# Patient Record
Sex: Female | Born: 1980
Health system: Southern US, Community
[De-identification: ages and names within clinical notes are randomized; demographics above are authoritative.]

## PROBLEM LIST (undated history)

## (undated) ENCOUNTER — Inpatient Hospital Stay (HOSPITAL_COMMUNITY): Payer: Self-pay

## (undated) DIAGNOSIS — G5600 Carpal tunnel syndrome, unspecified upper limb: Secondary | ICD-10-CM

## (undated) DIAGNOSIS — E785 Hyperlipidemia, unspecified: Secondary | ICD-10-CM

## (undated) DIAGNOSIS — N390 Urinary tract infection, site not specified: Secondary | ICD-10-CM

## (undated) DIAGNOSIS — F909 Attention-deficit hyperactivity disorder, unspecified type: Secondary | ICD-10-CM

## (undated) DIAGNOSIS — E669 Obesity, unspecified: Secondary | ICD-10-CM

## (undated) DIAGNOSIS — O139 Gestational [pregnancy-induced] hypertension without significant proteinuria, unspecified trimester: Secondary | ICD-10-CM

## (undated) DIAGNOSIS — F419 Anxiety disorder, unspecified: Secondary | ICD-10-CM

## (undated) DIAGNOSIS — F32A Depression, unspecified: Secondary | ICD-10-CM

## (undated) DIAGNOSIS — K900 Celiac disease: Secondary | ICD-10-CM

## (undated) DIAGNOSIS — O24419 Gestational diabetes mellitus in pregnancy, unspecified control: Secondary | ICD-10-CM

## (undated) DIAGNOSIS — E119 Type 2 diabetes mellitus without complications: Secondary | ICD-10-CM

## (undated) DIAGNOSIS — F329 Major depressive disorder, single episode, unspecified: Secondary | ICD-10-CM

## (undated) HISTORY — DX: Obesity, unspecified: E66.9

## (undated) HISTORY — DX: Hyperlipidemia, unspecified: E78.5

## (undated) HISTORY — DX: Anxiety disorder, unspecified: F41.9

## (undated) HISTORY — DX: Major depressive disorder, single episode, unspecified: F32.9

## (undated) HISTORY — DX: Gestational (pregnancy-induced) hypertension without significant proteinuria, unspecified trimester: O13.9

## (undated) HISTORY — DX: Depression, unspecified: F32.A

## (undated) HISTORY — DX: Attention-deficit hyperactivity disorder, unspecified type: F90.9

## (undated) HISTORY — DX: Urinary tract infection, site not specified: N39.0

## (undated) HISTORY — DX: Type 2 diabetes mellitus without complications: E11.9

## (undated) HISTORY — DX: Carpal tunnel syndrome, unspecified upper limb: G56.00

## (undated) HISTORY — DX: Gestational diabetes mellitus in pregnancy, unspecified control: O24.419

## (undated) HISTORY — PX: MOUTH SURGERY: SHX715

---

## 2010-03-25 HISTORY — PX: SKIN BIOPSY: SHX1

## 2013-01-14 LAB — OB RESULTS CONSOLE GC/CHLAMYDIA: Chlamydia: NEGATIVE

## 2013-02-22 ENCOUNTER — Encounter: Payer: Self-pay | Admitting: *Deleted

## 2013-02-23 ENCOUNTER — Encounter (INDEPENDENT_AMBULATORY_CARE_PROVIDER_SITE_OTHER): Payer: Self-pay

## 2013-02-23 ENCOUNTER — Encounter: Payer: Self-pay | Admitting: Women's Health

## 2013-02-23 ENCOUNTER — Ambulatory Visit (INDEPENDENT_AMBULATORY_CARE_PROVIDER_SITE_OTHER): Payer: Medicaid Other | Admitting: Women's Health

## 2013-02-23 VITALS — BP 132/80 | Ht 67.0 in | Wt 252.4 lb

## 2013-02-23 DIAGNOSIS — Z331 Pregnant state, incidental: Secondary | ICD-10-CM

## 2013-02-23 DIAGNOSIS — O99340 Other mental disorders complicating pregnancy, unspecified trimester: Secondary | ICD-10-CM

## 2013-02-23 DIAGNOSIS — O0991 Supervision of high risk pregnancy, unspecified, first trimester: Secondary | ICD-10-CM

## 2013-02-23 DIAGNOSIS — F418 Other specified anxiety disorders: Secondary | ICD-10-CM | POA: Insufficient documentation

## 2013-02-23 DIAGNOSIS — O99019 Anemia complicating pregnancy, unspecified trimester: Secondary | ICD-10-CM

## 2013-02-23 DIAGNOSIS — Z1389 Encounter for screening for other disorder: Secondary | ICD-10-CM

## 2013-02-23 DIAGNOSIS — O210 Mild hyperemesis gravidarum: Secondary | ICD-10-CM

## 2013-02-23 DIAGNOSIS — O09299 Supervision of pregnancy with other poor reproductive or obstetric history, unspecified trimester: Secondary | ICD-10-CM

## 2013-02-23 DIAGNOSIS — Z3480 Encounter for supervision of other normal pregnancy, unspecified trimester: Secondary | ICD-10-CM | POA: Insufficient documentation

## 2013-02-23 DIAGNOSIS — O09899 Supervision of other high risk pregnancies, unspecified trimester: Secondary | ICD-10-CM

## 2013-02-23 DIAGNOSIS — Z3481 Encounter for supervision of other normal pregnancy, first trimester: Secondary | ICD-10-CM

## 2013-02-23 LAB — POCT URINALYSIS DIPSTICK
Glucose, UA: NEGATIVE
Ketones, UA: NEGATIVE
Nitrite, UA: NEGATIVE

## 2013-02-23 LAB — CBC
MCH: 28.9 pg (ref 26.0–34.0)
MCV: 84.1 fL (ref 78.0–100.0)
Platelets: 242 10*3/uL (ref 150–400)
RBC: 4.08 MIL/uL (ref 3.87–5.11)
RDW: 14.1 % (ref 11.5–15.5)
WBC: 9.9 10*3/uL (ref 4.0–10.5)

## 2013-02-23 NOTE — Patient Instructions (Signed)
Nausea & Vomiting  Have saltine crackers or pretzels by your bed and eat a few bites before you raise your head out of bed in the morning  Eat small frequent meals throughout the day instead of large meals  Drink plenty of fluids throughout the day to stay hydrated, just don't drink a lot of fluids with your meals.  This can make your stomach fill up faster making you feel sick  Do not brush your teeth right after you eat  Products with real ginger are good for nausea, like ginger ale and ginger hard candy Make sure it says made with real ginger!  Sucking on sour candy like lemon heads is also good for nausea  If your prenatal vitamins make you nauseated, take them at night so you will sleep through the nausea  If you feel like you need medicine for the nausea & vomiting please let us know  If you are unable to keep any fluids or food down please let us know    Pregnancy - First Trimester During sexual intercourse, millions of sperm go into the vagina. Only 1 sperm will penetrate and fertilize the female egg while it is in the Fallopian tube. One week later, the fertilized egg implants into the wall of the uterus. An embryo begins to develop into a baby. At 6 to 8 weeks, the eyes and face are formed and the heartbeat can be seen on ultrasound. At the end of 12 weeks (first trimester), all the baby's organs are formed. Now that you are pregnant, you will want to do everything you can to have a healthy baby. Two of the most important things are to get good prenatal care and follow your caregiver's instructions. Prenatal care is all the medical care you receive before the baby's birth. It is given to prevent, find, and treat problems during the pregnancy and childbirth. PRENATAL EXAMS  During prenatal visits, your weight, blood pressure, and urine are checked. This is done to make sure you are healthy and progressing normally during the pregnancy.  A pregnant woman should gain 25 to 35 pounds  during the pregnancy. However, if you are overweight or underweight, your caregiver will advise you regarding your weight.  Your caregiver will ask and answer questions for you.  Blood work, cervical cultures, other necessary tests, and a Pap test are done during your prenatal exams. These tests are done to check on your health and the probable health of your baby. Tests are strongly recommended and done for HIV with your permission. This is the virus that causes AIDS. These tests are done because medicines can be given to help prevent your baby from being born with this infection should you have been infected without knowing it. Blood work is also used to find out your blood type, previous infections, and follow your blood levels (hemoglobin).  Low hemoglobin (anemia) is common during pregnancy. Iron and vitamins are given to help prevent this. Later in the pregnancy, blood tests for diabetes will be done along with any other tests if any problems develop.  You may need other tests to make sure you and the baby are doing well. CHANGES DURING THE FIRST TRIMESTER  Your body goes through many changes during pregnancy. They vary from person to person. Talk to your caregiver about changes you notice and are concerned about. Changes can include:  Your menstrual period stops.  The egg and sperm carry the genes that determine what you look like. Genes from you   and your partner are forming a baby. The female genes determine whether the baby is a boy or a girl.  Your body increases in girth and you may feel bloated.  Feeling sick to your stomach (nauseous) and throwing up (vomiting). If the vomiting is uncontrollable, call your caregiver.  Your breasts will begin to enlarge and become tender.  Your nipples may stick out more and become darker.  The need to urinate more. Painful urination may mean you have a bladder infection.  Tiring easily.  Loss of appetite.  Cravings for certain kinds of  food.  At first, you may gain or lose a couple of pounds.  You may have changes in your emotions from day to day (excited to be pregnant or concerned something may go wrong with the pregnancy and baby).  You may have more vivid and strange dreams. HOME CARE INSTRUCTIONS   It is very important to avoid all smoking, alcohol and non-prescribed drugs during your pregnancy. These affect the formation and growth of the baby. Avoid chemicals while pregnant to ensure the delivery of a healthy infant.  Start your prenatal visits by the 12th week of pregnancy. They are usually scheduled monthly at first, then more often in the last 2 months before delivery. Keep your caregiver's appointments. Follow your caregiver's instructions regarding medicine use, blood and lab tests, exercise, and diet.  During pregnancy, you are providing food for you and your baby. Eat regular, well-balanced meals. Choose foods such as meat, fish, milk and other low fat dairy products, vegetables, fruits, and whole-grain breads and cereals. Your caregiver will tell you of the ideal weight gain.  You can help morning sickness by keeping soda crackers at the bedside. Eat a couple before arising in the morning. You may want to use the crackers without salt on them.  Eating 4 to 5 small meals rather than 3 large meals a day also may help the nausea and vomiting.  Drinking liquids between meals instead of during meals also seems to help nausea and vomiting.  A physical sexual relationship may be continued throughout pregnancy if there are no other problems. Problems may be early (premature) leaking of amniotic fluid from the membranes, vaginal bleeding, or belly (abdominal) pain.  Exercise regularly if there are no restrictions. Check with your caregiver or physical therapist if you are unsure of the safety of some of your exercises. Greater weight gain will occur in the last 2 trimesters of pregnancy. Exercising will  help:  Control your weight.  Keep you in shape.  Prepare you for labor and delivery.  Help you lose your pregnancy weight after you deliver your baby.  Wear a good support or jogging bra for breast tenderness during pregnancy. This may help if worn during sleep too.  Ask when prenatal classes are available. Begin classes when they are offered.  Do not use hot tubs, steam rooms, or saunas.  Wear your seat belt when driving. This protects you and your baby if you are in an accident.  Avoid raw meat, uncooked cheese, cat litter boxes, and soil used by cats throughout the pregnancy. These carry germs that can cause birth defects in the baby.  The first trimester is a good time to visit your dentist for your dental health. Getting your teeth cleaned is okay. Use a softer toothbrush and brush gently during pregnancy.  Ask for help if you have financial, counseling, or nutritional needs during pregnancy. Your caregiver will be able to offer counseling for   these needs as well as refer you for other special needs.  Do not take any medicines or herbs unless told by your caregiver.  Inform your caregiver if there is any mental or physical domestic violence.  Make a list of emergency phone numbers of family, friends, hospital, and police and fire departments.  Write down your questions. Take them to your prenatal visit.  Do not douche.  Do not cross your legs.  If you have to stand for long periods of time, rotate you feet or take small steps in a circle.  You may have more vaginal secretions that may require a sanitary pad. Do not use tampons or scented sanitary pads. MEDICINES AND DRUG USE IN PREGNANCY  Take prenatal vitamins as directed. The vitamin should contain 1 milligram of folic acid. Keep all vitamins out of reach of children. Only a couple vitamins or tablets containing iron may be fatal to a baby or young child when ingested.  Avoid use of all medicines, including herbs,  over-the-counter medicines, not prescribed or suggested by your caregiver. Only take over-the-counter or prescription medicines for pain, discomfort, or fever as directed by your caregiver. Do not use aspirin, ibuprofen, or naproxen unless directed by your caregiver.  Let your caregiver also know about herbs you may be using.  Alcohol is related to a number of birth defects. This includes fetal alcohol syndrome. All alcohol, in any form, should be avoided completely. Smoking will cause low birth rate and premature babies.  Street or illegal drugs are very harmful to the baby. They are absolutely forbidden. A baby born to an addicted mother will be addicted at birth. The baby will go through the same withdrawal an adult does.  Let your caregiver know about any medicines that you have to take and for what reason you take them. SEEK MEDICAL CARE IF:  You have any concerns or worries during your pregnancy. It is better to call with your questions if you feel they cannot wait, rather than worry about them. SEEK IMMEDIATE MEDICAL CARE IF:   An unexplained oral temperature above 102 F (38.9 C) develops, or as your caregiver suggests.  You have leaking of fluid from the vagina (birth canal). If leaking membranes are suspected, take your temperature and inform your caregiver of this when you call.  There is vaginal spotting or bleeding. Notify your caregiver of the amount and how many pads are used.  You develop a bad smelling vaginal discharge with a change in the color.  You continue to feel sick to your stomach (nauseated) and have no relief from remedies suggested. You vomit blood or coffee ground-like materials.  You lose more than 2 pounds of weight in 1 week.  You gain more than 2 pounds of weight in 1 week and you notice swelling of your face, hands, feet, or legs.  You gain 5 pounds or more in 1 week (even if you do not have swelling of your hands, face, legs, or feet).  You get  exposed to German measles and have never had them.  You are exposed to fifth disease or chickenpox.  You develop belly (abdominal) pain. Round ligament discomfort is a common non-cancerous (benign) cause of abdominal pain in pregnancy. Your caregiver still must evaluate this.  You develop headache, fever, diarrhea, pain with urination, or shortness of breath.  You fall or are in a car accident or have any kind of trauma.  There is mental or physical violence in your home. Document   Released: 03/05/2001 Document Revised: 12/04/2011 Document Reviewed: 09/06/2008 [employer] Patient Information 2014 [employer].  

## 2013-02-23 NOTE — Progress Notes (Addendum)
Subjective:    Julia Johnson is a 32 y.o. G11P1011 Caucasian female at [redacted]w[redacted]d by 7.3wk u/s, which differed from certain LMP by 8d. She is a transfer from [facility] in [place], [place] where she began care at 5wks. She states she had an early u/s w/ assigned EDB of 09/21/12 (8d difference from LMP), so they were going by u/s, but they did not send that u/s w/ her records. We will call to get it faxed.  Her BP at initial visit there at 5wks was 148/94. Denies h/o HTN. Had neg pap w/ -HRHPV in Oct. She is being seen today for her first obstetrical visit w/ [place].  She used to live in [place], and she and her family moved back here 11/15 after her husband got a job here, so that she can be closer to her family for help during pregnancy. Her husband, Lorin Picket, was adopted at birth. He has CP and is in a wheelchair d/t lack of O2 at birth. Does not know anything else about his family medical hx d/t adoption. Her obstetrical history is significant for term uncomplicated SVD in 2008- states her BPs had begun getting higher towards end of pregnancy but did not have pre-e. She had a SAB in Feb of this year..  Daughter has celiac disease. Pregnancy history fully reviewed. She does have a h/o depression/anxiety- was on prozac and adderall prior to pregnancy, quit both w/ +PT, states she is doing well w/o meds. Was seeing NP at [facility]. Would like referral for therapy here.    Patient reports Spotting earlier in her pregnancy which has resolved. Denies cramping/vb, uti s/s. She has had some n/v- which seems to be getting some better- declines need for antiemetics. Nonproductive cough, no other uri sx.   Filed Vitals:   02/23/13 1434  BP: 132/80  Weight: 252 lb 6.4 oz (114.488 kg)    HISTORY: OB History  Gravida Para Term Preterm AB SAB TAB Ectopic Multiple Living  3 1 1  1 1    1     # Outcome Date GA Lbr Len/2nd Weight Sex Delivery Anes PTL Lv  3 CUR           2 SAB 05/10/12          1 TRM  05/09/06 [redacted]w[redacted]d  8 lb 7 oz (3.827 kg) F SVD None  Y     Comments: BPs had started to elevate, but no pre-e     Past Medical History  Diagnosis Date  . Carpal tunnel syndrome   . Depression   . ADHD (attention deficit hyperactivity disorder)   . Hyperlipidemia   . Obesity   . Anxiety   . UTI (urinary tract infection)    Past Surgical History  Procedure Laterality Date  . Mouth surgery     Family History  Problem Relation Age of Onset  . Cancer Mother     breast  . Diabetes Mother   . Factor V Leiden deficiency Mother   . Hypertension Father   . Diabetes Father   . Aneurysm Father   . Diabetes Maternal Grandmother   . Celiac disease Daughter   . Diabetes Paternal Grandfather      Exam   System:     Skin: normal coloration and turgor, no rashes    Neurologic: oriented, normal mood   Extremities: normal strength, tone, and muscle mass   HEENT PERRLA   Mouth/Teeth mucous membranes moist   Cardiovascular: regular rate and rhythm  Respiratory:  appears well, vitals normal, no respiratory distress, acyanotic, normal RR   Abdomen: soft, non-tender    FHR 162 via doppler   Assessment:    Pregnancy: G3P1011 Patient Active Problem List   Diagnosis Date Noted  . Supervision of other normal pregnancy 02/23/2013    Priority: High      [redacted]w[redacted]d G3P1011 New OB visit N/V pregnancy H/O depression/anxiety Elevated bp at 5wks    Plan:     Initial labs drawn Continue prenatal vitamins Problem list reviewed and updated Reviewed n/v relief measures and warning s/s to report Reviewed recommended weight gain based on pre-gravid BMI Encouraged well-balanced diet Genetic Screening discussed Integrated Screen: declined Cystic fibrosis screening discussed declined Ultrasound discussed; fetal survey: requested Follow up in 4 weeks for visit Monitor BPs Will get u/s report from previous provider   Marge Duncans 02/23/2013 3:30 PM

## 2013-02-24 LAB — ANTIBODY SCREEN: Antibody Screen: NEGATIVE

## 2013-02-24 LAB — ABO AND RH: Rh Type: POSITIVE

## 2013-02-24 LAB — RUBELLA SCREEN: Rubella: 1.62 Index — ABNORMAL HIGH (ref <0.90)

## 2013-02-24 LAB — CYSTIC FIBROSIS DIAGNOSTIC STUDY

## 2013-02-24 LAB — URINE CULTURE: Colony Count: NO GROWTH

## 2013-02-24 LAB — RPR

## 2013-02-24 LAB — VARICELLA ZOSTER ANTIBODY, IGG: Varicella IgG: 273.6 Index — ABNORMAL HIGH (ref <135.00)

## 2013-02-24 LAB — HIV ANTIBODY (ROUTINE TESTING W REFLEX): HIV: NONREACTIVE

## 2013-02-26 ENCOUNTER — Encounter: Payer: Self-pay | Admitting: Women's Health

## 2013-03-01 ENCOUNTER — Encounter: Payer: Self-pay | Admitting: Women's Health

## 2013-03-23 ENCOUNTER — Ambulatory Visit (INDEPENDENT_AMBULATORY_CARE_PROVIDER_SITE_OTHER): Payer: Medicaid Other | Admitting: Advanced Practice Midwife

## 2013-03-23 ENCOUNTER — Encounter: Payer: Self-pay | Admitting: Advanced Practice Midwife

## 2013-03-23 VITALS — BP 118/80 | Wt 249.0 lb

## 2013-03-23 DIAGNOSIS — Z3482 Encounter for supervision of other normal pregnancy, second trimester: Secondary | ICD-10-CM

## 2013-03-23 DIAGNOSIS — O09899 Supervision of other high risk pregnancies, unspecified trimester: Secondary | ICD-10-CM

## 2013-03-23 DIAGNOSIS — O09299 Supervision of pregnancy with other poor reproductive or obstetric history, unspecified trimester: Secondary | ICD-10-CM

## 2013-03-23 DIAGNOSIS — Z331 Pregnant state, incidental: Secondary | ICD-10-CM

## 2013-03-23 DIAGNOSIS — O99019 Anemia complicating pregnancy, unspecified trimester: Secondary | ICD-10-CM

## 2013-03-23 DIAGNOSIS — O99340 Other mental disorders complicating pregnancy, unspecified trimester: Secondary | ICD-10-CM

## 2013-03-23 DIAGNOSIS — Z1389 Encounter for screening for other disorder: Secondary | ICD-10-CM

## 2013-03-23 LAB — POCT URINALYSIS DIPSTICK
Blood, UA: NEGATIVE
Glucose, UA: NEGATIVE
Ketones, UA: NEGATIVE
Protein, UA: NEGATIVE

## 2013-03-23 MED ORDER — PNV PRENATAL PLUS MULTIVITAMIN 27-1 MG PO TABS: 1.0000 | ORAL_TABLET | Freq: Once | ORAL | Status: DC

## 2013-03-23 NOTE — Progress Notes (Signed)
No real problems/complaints. Feels OK as far as depression.  Husband worried, but pt doesn't want to take meds.   Wants to start exercising: pool or bike or eliptical.  Start slowly. Routine questions about pregnancy answered.  F/U in 5 weeks for LROB.

## 2013-03-25 NOTE — L&D Delivery Note (Signed)
Delivery Note At 11:35 PM a viable female was delivered via Vaginal, Spontaneous Delivery (Presentation: Right Occiput Anterior).  APGAR: 8, 9; weight .   Placenta status: Intact, Spontaneous.  Cord:  with the following complications: nuchal x1.    Anesthesia: None  Episiotomy: n/a Lacerations: 2nd degree Suture Repair: 3.0 vicryl Est. Blood Loss (mL): 450cc  Mom to postpartum.  Baby to Couplet care / Skin to Skin.  Pt pushed with good maternal effort to deliver a liveborn female via NSVD with spontaneous cry.   Baby placed on maternal abdomen.  Delayed cord clamping performed.  Cord cut by FOB.  Placenta delivered intact with 3V cord via traction and pitocin.  2nd degree tear repaired in usual fashion. Small posterior labial hematoma approx 1cm in diameter had formed prior to repair but remained stable in size. Will cont to monitor.  Mom and baby to postpartum.   BECK, KELI L 09/15/2013, 12:07 AM

## 2013-04-26 ENCOUNTER — Encounter: Payer: Self-pay | Admitting: General Practice

## 2013-04-27 ENCOUNTER — Ambulatory Visit (INDEPENDENT_AMBULATORY_CARE_PROVIDER_SITE_OTHER): Payer: Medicaid Other | Admitting: Women's Health

## 2013-04-27 ENCOUNTER — Encounter: Payer: Self-pay | Admitting: Women's Health

## 2013-04-27 ENCOUNTER — Other Ambulatory Visit: Payer: Self-pay | Admitting: Advanced Practice Midwife

## 2013-04-27 ENCOUNTER — Ambulatory Visit (INDEPENDENT_AMBULATORY_CARE_PROVIDER_SITE_OTHER): Payer: Medicaid Other

## 2013-04-27 VITALS — BP 120/68 | Wt 254.5 lb

## 2013-04-27 DIAGNOSIS — Z3480 Encounter for supervision of other normal pregnancy, unspecified trimester: Secondary | ICD-10-CM

## 2013-04-27 DIAGNOSIS — O09299 Supervision of pregnancy with other poor reproductive or obstetric history, unspecified trimester: Secondary | ICD-10-CM

## 2013-04-27 DIAGNOSIS — O99340 Other mental disorders complicating pregnancy, unspecified trimester: Secondary | ICD-10-CM

## 2013-04-27 DIAGNOSIS — O09899 Supervision of other high risk pregnancies, unspecified trimester: Secondary | ICD-10-CM

## 2013-04-27 DIAGNOSIS — Z1389 Encounter for screening for other disorder: Secondary | ICD-10-CM

## 2013-04-27 DIAGNOSIS — Z23 Encounter for immunization: Secondary | ICD-10-CM

## 2013-04-27 DIAGNOSIS — Z3482 Encounter for supervision of other normal pregnancy, second trimester: Secondary | ICD-10-CM

## 2013-04-27 DIAGNOSIS — Z331 Pregnant state, incidental: Secondary | ICD-10-CM

## 2013-04-27 DIAGNOSIS — O99019 Anemia complicating pregnancy, unspecified trimester: Secondary | ICD-10-CM

## 2013-04-27 DIAGNOSIS — Z803 Family history of malignant neoplasm of breast: Secondary | ICD-10-CM

## 2013-04-27 LAB — POCT URINALYSIS DIPSTICK
Blood, UA: NEGATIVE
GLUCOSE UA: NEGATIVE
Ketones, UA: NEGATIVE
LEUKOCYTES UA: NEGATIVE
Nitrite, UA: NEGATIVE
Protein, UA: NEGATIVE

## 2013-04-27 MED ORDER — INFLUENZA VAC SPLIT QUAD 0.5 ML IM SUSP
0.5000 mL | Freq: Once | INTRAMUSCULAR | Status: AC
  Administered 2013-04-27: 0.5 mL via INTRAMUSCULAR

## 2013-04-27 NOTE — Patient Instructions (Signed)
You will have your sugar test next visit.  Please do not eat or drink anything after midnight the night before you come, not even water.  You will be here for at least two hours.     Second Trimester of Pregnancy The second trimester is from week 13 through week 28, months 4 through 6. The second trimester is often a time when you feel your best. Your body has also adjusted to being pregnant, and you begin to feel better physically. Usually, morning sickness has lessened or quit completely, you may have more energy, and you may have an increase in appetite. The second trimester is also a time when the fetus is growing rapidly. At the end of the sixth month, the fetus is about 9 inches long and weighs about 1 pounds. You will likely begin to feel the baby move (quickening) between 18 and 20 weeks of the pregnancy. BODY CHANGES Your body goes through many changes during pregnancy. The changes vary from woman to woman.   Your weight will continue to increase. You will notice your lower abdomen bulging out.  You may begin to get stretch marks on your hips, abdomen, and breasts.  You may develop headaches that can be relieved by medicines approved by your caregiver.  You may urinate more often because the fetus is pressing on your bladder.  You may develop or continue to have heartburn as a result of your pregnancy.  You may develop constipation because certain hormones are causing the muscles that push waste through your intestines to slow down.  You may develop hemorrhoids or swollen, bulging veins (varicose veins).  You may have back pain because of the weight gain and pregnancy hormones relaxing your joints between the bones in your pelvis and as a result of a shift in weight and the muscles that support your balance.  Your breasts will continue to grow and be tender.  Your gums may bleed and may be sensitive to brushing and flossing.  Dark spots or blotches (chloasma, mask of pregnancy)  may develop on your face. This will likely fade after the baby is born.  A dark line from your belly button to the pubic area (linea nigra) may appear. This will likely fade after the baby is born. WHAT TO EXPECT AT YOUR PRENATAL VISITS During a routine prenatal visit:  You will be weighed to make sure you and the fetus are growing normally.  Your blood pressure will be taken.  Your abdomen will be measured to track your baby's growth.  The fetal heartbeat will be listened to.  Any test results from the previous visit will be discussed. Your caregiver may ask you:  How you are feeling.  If you are feeling the baby move.  If you have had any abnormal symptoms, such as leaking fluid, bleeding, severe headaches, or abdominal cramping.  If you have any questions. Other tests that may be performed during your second trimester include:  Blood tests that check for:  Low iron levels (anemia).  Gestational diabetes (between 24 and 28 weeks).  Rh antibodies.  Urine tests to check for infections, diabetes, or protein in the urine.  An ultrasound to confirm the proper growth and development of the baby.  An amniocentesis to check for possible genetic problems.  Fetal screens for spina bifida and Down syndrome. HOME CARE INSTRUCTIONS   Avoid all smoking, herbs, alcohol, and unprescribed drugs. These chemicals affect the formation and growth of the baby.  Follow your caregiver's   instructions regarding medicine use. There are medicines that are either safe or unsafe to take during pregnancy.  Exercise only as directed by your caregiver. Experiencing uterine cramps is a good sign to stop exercising.  Continue to eat regular, healthy meals.  Wear a good support bra for breast tenderness.  Do not use hot tubs, steam rooms, or saunas.  Wear your seat belt at all times when driving.  Avoid raw meat, uncooked cheese, cat litter boxes, and soil used by cats. These carry germs that  can cause birth defects in the baby.  Take your prenatal vitamins.  Try taking a stool softener (if your caregiver approves) if you develop constipation. Eat more high-fiber foods, such as fresh vegetables or fruit and whole grains. Drink plenty of fluids to keep your urine clear or pale yellow.  Take warm sitz baths to soothe any pain or discomfort caused by hemorrhoids. Use hemorrhoid cream if your caregiver approves.  If you develop varicose veins, wear support hose. Elevate your feet for 15 minutes, 3 4 times a day. Limit salt in your diet.  Avoid heavy lifting, wear low heel shoes, and practice good posture.  Rest with your legs elevated if you have leg cramps or low back pain.  Visit your dentist if you have not gone yet during your pregnancy. Use a soft toothbrush to brush your teeth and be gentle when you floss.  A sexual relationship may be continued unless your caregiver directs you otherwise.  Continue to go to all your prenatal visits as directed by your caregiver. SEEK MEDICAL CARE IF:   You have dizziness.  You have mild pelvic cramps, pelvic pressure, or nagging pain in the abdominal area.  You have persistent nausea, vomiting, or diarrhea.  You have a bad smelling vaginal discharge.  You have pain with urination. SEEK IMMEDIATE MEDICAL CARE IF:   You have a fever.  You are leaking fluid from your vagina.  You have spotting or bleeding from your vagina.  You have severe abdominal cramping or pain.  You have rapid weight gain or loss.  You have shortness of breath with chest pain.  You notice sudden or extreme swelling of your face, hands, ankles, feet, or legs.  You have not felt your baby move in over an hour.  You have severe headaches that do not go away with medicine.  You have vision changes. Document Released: 03/05/2001 Document Revised: 11/11/2012 Document Reviewed: 05/12/2012 [employer] Patient Information 2014 [employer].  

## 2013-04-27 NOTE — Progress Notes (Signed)
U/S(19+0wks)-active fetus, meas c/w dates, fluid wnl, anterior Gr 0 placenta, cx appears long and closed (3.7cm), bilateral adnexa wnl, no obvious abnl noted, FHR- 153bpm

## 2013-04-27 NOTE — Progress Notes (Signed)
Reports good fm. Denies uc's, lof, vb, uti s/s.  No complaints.  Reviewed today's anatomy u/s, ptl s/s.  Mom dx w/ breast CA at 33yo, MD recommended Julia Johnson to begin getting mammo 54yr earlier than mom's dx, she plans to BF x 12mths, so will get mammo pp when finished BF. Reports strong family h/o DM in immediate family, coupled w/ BMI of 38.4, will do early 2hr gtt. All questions answered. F/U asap for early 2hr gtt, then 3wks for visit.

## 2013-04-30 ENCOUNTER — Other Ambulatory Visit: Payer: Medicaid Other

## 2013-04-30 DIAGNOSIS — Z3480 Encounter for supervision of other normal pregnancy, unspecified trimester: Secondary | ICD-10-CM

## 2013-04-30 LAB — CBC
HCT: 31.5 % — ABNORMAL LOW (ref 36.0–46.0)
HEMOGLOBIN: 10.6 g/dL — AB (ref 12.0–15.0)
MCH: 29 pg (ref 26.0–34.0)
MCHC: 33.7 g/dL (ref 30.0–36.0)
MCV: 86.1 fL (ref 78.0–100.0)
Platelets: 199 10*3/uL (ref 150–400)
RBC: 3.66 MIL/uL — AB (ref 3.87–5.11)
RDW: 14.5 % (ref 11.5–15.5)
WBC: 9.3 10*3/uL (ref 4.0–10.5)

## 2013-04-30 LAB — RPR

## 2013-05-01 LAB — GLUCOSE TOLERANCE, 2 HOURS W/ 1HR
GLUCOSE: 147 mg/dL (ref 70–170)
Glucose, 2 hour: 121 mg/dL (ref 70–139)
Glucose, Fasting: 92 mg/dL (ref 70–99)

## 2013-05-01 LAB — ANTIBODY SCREEN: ANTIBODY SCREEN: NEGATIVE

## 2013-05-01 LAB — HIV ANTIBODY (ROUTINE TESTING W REFLEX): HIV: NONREACTIVE

## 2013-05-02 ENCOUNTER — Encounter: Payer: Self-pay | Admitting: Obstetrics & Gynecology

## 2013-05-03 LAB — HSV 2 ANTIBODY, IGG: HSV 2 Glycoprotein G Ab, IgG: <0.1 IV

## 2013-05-05 ENCOUNTER — Ambulatory Visit (INDEPENDENT_AMBULATORY_CARE_PROVIDER_SITE_OTHER): Payer: Medicaid Other | Admitting: Women's Health

## 2013-05-05 VITALS — BP 120/78 | Wt 253.0 lb

## 2013-05-05 DIAGNOSIS — Z3480 Encounter for supervision of other normal pregnancy, unspecified trimester: Secondary | ICD-10-CM

## 2013-05-05 DIAGNOSIS — O09299 Supervision of pregnancy with other poor reproductive or obstetric history, unspecified trimester: Secondary | ICD-10-CM

## 2013-05-05 DIAGNOSIS — O99340 Other mental disorders complicating pregnancy, unspecified trimester: Secondary | ICD-10-CM

## 2013-05-05 DIAGNOSIS — O09899 Supervision of other high risk pregnancies, unspecified trimester: Secondary | ICD-10-CM

## 2013-05-05 DIAGNOSIS — Z331 Pregnant state, incidental: Secondary | ICD-10-CM

## 2013-05-05 DIAGNOSIS — O99019 Anemia complicating pregnancy, unspecified trimester: Secondary | ICD-10-CM

## 2013-05-05 DIAGNOSIS — Z1389 Encounter for screening for other disorder: Secondary | ICD-10-CM

## 2013-05-05 LAB — POCT URINALYSIS DIPSTICK
Blood, UA: NEGATIVE
GLUCOSE UA: NEGATIVE
Ketones, UA: NEGATIVE
Nitrite, UA: NEGATIVE
Protein, UA: NEGATIVE

## 2013-05-05 NOTE — Progress Notes (Signed)
Work-in: intermittent throbbing Lt sided chest pain, feels almost like pulled muscle and 'just feeling weird'. Has been taking bp's at home and they have been 'all over the place' 110s/70s-150s/100s. No h/o HTN. Denies ha, scotomata, ruq/epigastric pain, n/v.  Does see floaters while lying in bed q am when she wakes up, but this happened pre-pregnancy as well. Denies sob, n/v, reflux/heartburn. HRRR, LCTAB, chest non-tender to palpation, no change w/ inspiration. Can try heating pad, apap as needed to see if it helps. To take bp 3x/d while resting at home, if consistently >140/90, high spikes, or any new sx to call us. F/u as scheduled

## 2013-05-18 ENCOUNTER — Encounter: Payer: Self-pay | Admitting: Women's Health

## 2013-05-18 ENCOUNTER — Encounter: Payer: Medicaid Other | Admitting: Women's Health

## 2013-05-24 ENCOUNTER — Encounter: Payer: Self-pay | Admitting: Women's Health

## 2013-05-24 ENCOUNTER — Encounter: Payer: Medicaid Other | Admitting: Women's Health

## 2013-05-24 ENCOUNTER — Ambulatory Visit (INDEPENDENT_AMBULATORY_CARE_PROVIDER_SITE_OTHER): Payer: Medicaid Other | Admitting: Women's Health

## 2013-05-24 VITALS — BP 128/62 | Wt 257.5 lb

## 2013-05-24 DIAGNOSIS — O09899 Supervision of other high risk pregnancies, unspecified trimester: Secondary | ICD-10-CM

## 2013-05-24 DIAGNOSIS — Z3480 Encounter for supervision of other normal pregnancy, unspecified trimester: Secondary | ICD-10-CM

## 2013-05-24 DIAGNOSIS — O99019 Anemia complicating pregnancy, unspecified trimester: Secondary | ICD-10-CM

## 2013-05-24 DIAGNOSIS — Z1389 Encounter for screening for other disorder: Secondary | ICD-10-CM

## 2013-05-24 DIAGNOSIS — Z331 Pregnant state, incidental: Secondary | ICD-10-CM

## 2013-05-24 DIAGNOSIS — O99340 Other mental disorders complicating pregnancy, unspecified trimester: Secondary | ICD-10-CM

## 2013-05-24 DIAGNOSIS — O09299 Supervision of pregnancy with other poor reproductive or obstetric history, unspecified trimester: Secondary | ICD-10-CM

## 2013-05-24 LAB — POCT URINALYSIS DIPSTICK
Blood, UA: NEGATIVE
GLUCOSE UA: NEGATIVE
Ketones, UA: NEGATIVE
Leukocytes, UA: NEGATIVE
NITRITE UA: NEGATIVE
Protein, UA: NEGATIVE

## 2013-05-24 NOTE — Progress Notes (Signed)
Reports good fm. Denies uc's, lof, vb, uti s/s. Occ pain to Rt of umbilicus x few days.  No evidence of hernia, no constipation.  Reviewed ptl s/s, fm.  All questions answered. F/U in 4wks for pn2 and visit.

## 2013-05-24 NOTE — Patient Instructions (Signed)
You will have your sugar test next visit.  Please do not eat or drink anything after midnight the night before you come, not even water.  You will be here for at least two hours.     [place] [facility]:  [facility] [phone]            [facility] [phone]                 [facility] [phone] (usually doesn't accept new patients unless you have family there already, you are always welcome to call and ask)             [facility] ([address] [place]) [phone]   [place] [facility]ians:   [facility]: [phone]  [facility]: [phone]    Second Trimester of Pregnancy The second trimester is from week 13 through week 28, months 4 through 6. The second trimester is often a time when you feel your best. Your body has also adjusted to being pregnant, and you begin to feel better physically. Usually, morning sickness has lessened or quit completely, you may have more energy, and you may have an increase in appetite. The second trimester is also a time when the fetus is growing rapidly. At the end of the sixth month, the fetus is about 9 inches long and weighs about 1 pounds. You will likely begin to feel the baby move (quickening) between 18 and 20 weeks of the pregnancy. BODY CHANGES Your body goes through many changes during pregnancy. The changes vary from woman to woman.   Your weight will continue to increase. You will notice your lower abdomen bulging out.  You may begin to get stretch marks on your hips, abdomen, and breasts.  You may develop headaches that can be relieved by medicines approved by your caregiver.  You may urinate more often because the fetus is pressing on your bladder.  You may develop or continue to have heartburn as a result of your pregnancy.  You may develop constipation because certain hormones are causing the muscles that push  waste through your intestines to slow down.  You may develop hemorrhoids or swollen, bulging veins (varicose veins).  You may have back pain because of the weight gain and pregnancy hormones relaxing your joints between the bones in your pelvis and as a result of a shift in weight and the muscles that support your balance.  Your breasts will continue to grow and be tender.  Your gums may bleed and may be sensitive to brushing and flossing.  Dark spots or blotches (chloasma, mask of pregnancy) may develop on your face. This will likely fade after the baby is born.  A dark line from your belly button to the pubic area (linea nigra) may appear. This will likely fade after the baby is born. WHAT TO EXPECT AT YOUR PRENATAL VISITS During a routine prenatal visit:  You will be weighed to make sure you and the fetus are growing normally.  Your blood pressure will be taken.  Your abdomen will be measured to track your baby's growth.  The fetal heartbeat will be listened to.  Any test results from the previous visit will be discussed. Your caregiver may ask you:  How you are feeling.  If you are feeling the baby move.  If you have had any abnormal symptoms, such as leaking fluid, bleeding, severe headaches, or abdominal cramping.  If you have any questions. Other tests   that may be performed during your second trimester include:  Blood tests that check for:  Low iron levels (anemia).  Gestational diabetes (between 24 and 28 weeks).  Rh antibodies.  Urine tests to check for infections, diabetes, or protein in the urine.  An ultrasound to confirm the proper growth and development of the baby.  An amniocentesis to check for possible genetic problems.  Fetal screens for spina bifida and Down syndrome. HOME CARE INSTRUCTIONS   Avoid all smoking, herbs, alcohol, and unprescribed drugs. These chemicals affect the formation and growth of the baby.  Follow your caregiver's  instructions regarding medicine use. There are medicines that are either safe or unsafe to take during pregnancy.  Exercise only as directed by your caregiver. Experiencing uterine cramps is a good sign to stop exercising.  Continue to eat regular, healthy meals.  Wear a good support bra for breast tenderness.  Do not use hot tubs, steam rooms, or saunas.  Wear your seat belt at all times when driving.  Avoid raw meat, uncooked cheese, cat litter boxes, and soil used by cats. These carry germs that can cause birth defects in the baby.  Take your prenatal vitamins.  Try taking a stool softener (if your caregiver approves) if you develop constipation. Eat more high-fiber foods, such as fresh vegetables or fruit and whole grains. Drink plenty of fluids to keep your urine clear or pale yellow.  Take warm sitz baths to soothe any pain or discomfort caused by hemorrhoids. Use hemorrhoid cream if your caregiver approves.  If you develop varicose veins, wear support hose. Elevate your feet for 15 minutes, 3 4 times a day. Limit salt in your diet.  Avoid heavy lifting, wear low heel shoes, and practice good posture.  Rest with your legs elevated if you have leg cramps or low back pain.  Visit your dentist if you have not gone yet during your pregnancy. Use a soft toothbrush to brush your teeth and be gentle when you floss.  A sexual relationship may be continued unless your caregiver directs you otherwise.  Continue to go to all your prenatal visits as directed by your caregiver. SEEK MEDICAL CARE IF:   You have dizziness.  You have mild pelvic cramps, pelvic pressure, or nagging pain in the abdominal area.  You have persistent nausea, vomiting, or diarrhea.  You have a bad smelling vaginal discharge.  You have pain with urination. SEEK IMMEDIATE MEDICAL CARE IF:   You have a fever.  You are leaking fluid from your vagina.  You have spotting or bleeding from your vagina.  You  have severe abdominal cramping or pain.  You have rapid weight gain or loss.  You have shortness of breath with chest pain.  You notice sudden or extreme swelling of your face, hands, ankles, feet, or legs.  You have not felt your baby move in over an hour.  You have severe headaches that do not go away with medicine.  You have vision changes. Document Released: 03/05/2001 Document Revised: 11/11/2012 Document Reviewed: 05/12/2012 ExitCare Patient Information 2014 [employer].  

## 2013-06-10 ENCOUNTER — Encounter: Payer: Self-pay | Admitting: Women's Health

## 2013-06-17 ENCOUNTER — Telehealth: Payer: Self-pay | Admitting: Obstetrics and Gynecology

## 2013-06-17 NOTE — Telephone Encounter (Signed)
Pt c/o sore throat, nasal drip, no fever. Pt states believes allergy related. Pt informed can take OTC Zyrtec or Claritin, if no improvement to call our office back. Pt verbalized understanding.

## 2013-06-23 ENCOUNTER — Ambulatory Visit (INDEPENDENT_AMBULATORY_CARE_PROVIDER_SITE_OTHER): Payer: BC Managed Care – PPO | Admitting: Obstetrics and Gynecology

## 2013-06-23 ENCOUNTER — Encounter: Payer: Self-pay | Admitting: Obstetrics and Gynecology

## 2013-06-23 ENCOUNTER — Other Ambulatory Visit: Payer: BC Managed Care – PPO

## 2013-06-23 VITALS — BP 110/70 | Wt 265.0 lb

## 2013-06-23 DIAGNOSIS — Z1389 Encounter for screening for other disorder: Secondary | ICD-10-CM

## 2013-06-23 DIAGNOSIS — O09299 Supervision of pregnancy with other poor reproductive or obstetric history, unspecified trimester: Secondary | ICD-10-CM

## 2013-06-23 DIAGNOSIS — Z331 Pregnant state, incidental: Secondary | ICD-10-CM

## 2013-06-23 DIAGNOSIS — O99340 Other mental disorders complicating pregnancy, unspecified trimester: Secondary | ICD-10-CM

## 2013-06-23 DIAGNOSIS — Z3480 Encounter for supervision of other normal pregnancy, unspecified trimester: Secondary | ICD-10-CM

## 2013-06-23 DIAGNOSIS — O09899 Supervision of other high risk pregnancies, unspecified trimester: Secondary | ICD-10-CM

## 2013-06-23 DIAGNOSIS — O99019 Anemia complicating pregnancy, unspecified trimester: Secondary | ICD-10-CM

## 2013-06-23 LAB — CBC
HCT: 32.4 % — ABNORMAL LOW (ref 36.0–46.0)
Hemoglobin: 10.8 g/dL — ABNORMAL LOW (ref 12.0–15.0)
MCH: 28.3 pg (ref 26.0–34.0)
MCHC: 33.3 g/dL (ref 30.0–36.0)
MCV: 84.8 fL (ref 78.0–100.0)
PLATELETS: 215 10*3/uL (ref 150–400)
RBC: 3.82 MIL/uL — AB (ref 3.87–5.11)
RDW: 14.6 % (ref 11.5–15.5)
WBC: 11.7 10*3/uL — AB (ref 4.0–10.5)

## 2013-06-23 NOTE — Progress Notes (Signed)
7775w1d. G3P1A1. No complaints. Good FM. Plans to attend childbirth refresher course next month. Birth control afterbirth discussed. Pt plans to use condoms.   This chart was scribed by Bennett Scrapehristina Taylor, Medical Scribe, for Dr. Christin BachJohn Coston Mandato on 06/23/13 at 9:43 AM. This chart was reviewed by Dr. Christin BachJohn Lucio Litsey and is accurate.

## 2013-06-23 NOTE — Patient Instructions (Addendum)
Please review the birth control options. Please also try to curb your appetite to help control weight gain.

## 2013-06-23 NOTE — Progress Notes (Signed)
Pt wanted to discuss her allergies and wt.

## 2013-06-24 LAB — POCT URINALYSIS DIPSTICK
Blood, UA: NEGATIVE
GLUCOSE UA: NEGATIVE
KETONES UA: NEGATIVE
LEUKOCYTES UA: NEGATIVE
NITRITE UA: NEGATIVE
Protein, UA: NEGATIVE

## 2013-06-24 LAB — GLUCOSE TOLERANCE, 2 HOURS W/ 1HR
GLUCOSE, FASTING: 84 mg/dL (ref 70–99)
GLUCOSE: 127 mg/dL (ref 70–170)
Glucose, 2 hour: 101 mg/dL (ref 70–139)

## 2013-06-24 LAB — HIV ANTIBODY (ROUTINE TESTING W REFLEX): HIV: NONREACTIVE

## 2013-06-24 LAB — HSV 2 ANTIBODY, IGG: HSV 2 Glycoprotein G Ab, IgG: <0.1 IV

## 2013-06-24 LAB — ANTIBODY SCREEN: ANTIBODY SCREEN: NEGATIVE

## 2013-06-24 LAB — RPR

## 2013-07-07 ENCOUNTER — Telehealth: Payer: Self-pay | Admitting: *Deleted

## 2013-07-07 DIAGNOSIS — Z3480 Encounter for supervision of other normal pregnancy, unspecified trimester: Secondary | ICD-10-CM

## 2013-07-07 NOTE — Telephone Encounter (Signed)
Pt states that she slipped and fell on her left knee and right ankle. [facility] they sent her to ED and up to L&D to be monitored and all was well. Pt states that she has had a little round ligament pain today that went away after BM this morning. Pt just wanted to make us aware. Pt states that she is ok and her next Appointment is the 29th.

## 2013-07-07 NOTE — Telephone Encounter (Signed)
Pt aware of what Selena BattenKim advised, pt verbalized understanding.

## 2013-07-19 ENCOUNTER — Inpatient Hospital Stay (HOSPITAL_COMMUNITY)
Admission: AD | Admit: 2013-07-19 | Discharge: 2013-07-19 | Disposition: A | Discharge status: Home / Self Care | Payer: Medicaid Other | Source: Ambulatory Visit | Attending: Family Medicine | Admitting: Family Medicine

## 2013-07-19 ENCOUNTER — Encounter (HOSPITAL_COMMUNITY): Payer: Self-pay | Admitting: *Deleted

## 2013-07-19 DIAGNOSIS — O26899 Other specified pregnancy related conditions, unspecified trimester: Secondary | ICD-10-CM

## 2013-07-19 DIAGNOSIS — Z3480 Encounter for supervision of other normal pregnancy, unspecified trimester: Secondary | ICD-10-CM

## 2013-07-19 DIAGNOSIS — O9989 Other specified diseases and conditions complicating pregnancy, childbirth and the puerperium: Principal | ICD-10-CM

## 2013-07-19 DIAGNOSIS — R109 Unspecified abdominal pain: Secondary | ICD-10-CM

## 2013-07-19 DIAGNOSIS — E785 Hyperlipidemia, unspecified: Secondary | ICD-10-CM | POA: Insufficient documentation

## 2013-07-19 DIAGNOSIS — F329 Major depressive disorder, single episode, unspecified: Secondary | ICD-10-CM | POA: Insufficient documentation

## 2013-07-19 DIAGNOSIS — O99891 Other specified diseases and conditions complicating pregnancy: Secondary | ICD-10-CM

## 2013-07-19 DIAGNOSIS — F3289 Other specified depressive episodes: Secondary | ICD-10-CM | POA: Insufficient documentation

## 2013-07-19 DIAGNOSIS — F909 Attention-deficit hyperactivity disorder, unspecified type: Secondary | ICD-10-CM | POA: Insufficient documentation

## 2013-07-19 DIAGNOSIS — F411 Generalized anxiety disorder: Secondary | ICD-10-CM | POA: Insufficient documentation

## 2013-07-19 LAB — URINALYSIS, ROUTINE W REFLEX MICROSCOPIC
Bilirubin Urine: NEGATIVE
Glucose, UA: NEGATIVE mg/dL
Hgb urine dipstick: NEGATIVE
KETONES UR: NEGATIVE mg/dL
NITRITE: NEGATIVE
PH: 6 (ref 5.0–8.0)
Protein, ur: NEGATIVE mg/dL
Specific Gravity, Urine: 1.015 (ref 1.005–1.030)
Urobilinogen, UA: 0.2 mg/dL (ref 0.0–1.0)

## 2013-07-19 LAB — URINE MICROSCOPIC-ADD ON

## 2013-07-19 NOTE — Discharge Instructions (Signed)
Abdominal Pain During Pregnancy °Abdominal pain is common in pregnancy. Most of the time, it does not cause harm. There are many causes of abdominal pain. Some causes are more serious than others. Some of the causes of abdominal pain in pregnancy are easily diagnosed. Occasionally, the diagnosis takes time to understand. Other times, the cause is not determined. Abdominal pain can be a sign that something is very wrong with the pregnancy, or the pain may have nothing to do with the pregnancy at all. For this reason, always tell your health care provider if you have any abdominal discomfort. °HOME CARE INSTRUCTIONS  °Monitor your abdominal pain for any changes. The following actions may help to alleviate any discomfort you are experiencing: °· Do not have sexual intercourse or put anything in your vagina until your symptoms go away completely. °· Get plenty of rest until your pain improves. °· Drink clear fluids if you feel nauseous. Avoid solid food as long as you are uncomfortable or nauseous. °· Only take over-the-counter or prescription medicine as directed by your health care provider. °· Keep all follow-up appointments with your health care provider. °SEEK IMMEDIATE MEDICAL CARE IF: °· You are bleeding, leaking fluid, or passing tissue from the vagina. °· You have increasing pain or cramping. °· You have persistent vomiting. °· You have painful or bloody urination. °· You have a fever. °· You notice a decrease in your baby's movements. °· You have extreme weakness or feel faint. °· You have shortness of breath, with or without abdominal pain. °· You develop a severe headache with abdominal pain. °· You have abnormal vaginal discharge with abdominal pain. °· You have persistent diarrhea. °· You have abdominal pain that continues even after rest, or gets worse. °MAKE SURE YOU:  °· Understand these instructions. °· Will watch your condition. °· Will get help right away if you are not doing well or get  worse. °Document Released: 03/11/2005 Document Revised: 12/30/2012 Document Reviewed: 10/08/2012 °ExitCare® Patient Information ©2014 [employer]. ° °

## 2013-07-19 NOTE — MAU Provider Note (Signed)
History     CSN: [id number]  Arrival date and time: 07/19/13 2004   First Provider Initiated Contact with Patient 07/19/13 2107      Chief Complaint  Patient presents with  . Abdominal Pain   Abdominal Pain    Julia Johnson is a 33 y.o. G3P1011 at 9375w6d who presents today with pain and burning around her bellybutton. She states that she has had the pain off and on for several weeks. On Saturday she had the pain almost all day, and then it went away. She has had it for several hours today, and just wanted to make sure everything was ok. She states that she fell [facility] about [facility] weeks. She was monitored on labor and delivery there for 4 hours after her fall. She did not hit her abdomen at that time. She denies any VB, LOF or contractions and confirms fetal movement.   Past Medical History  Diagnosis Date  . Carpal tunnel syndrome   . Depression   . ADHD (attention deficit hyperactivity disorder)   . Hyperlipidemia   . Obesity   . Anxiety   . UTI (urinary tract infection)     Past Surgical History  Procedure Laterality Date  . Mouth surgery      Family History  Problem Relation Age of Onset  . Cancer Mother     breast  . Diabetes Mother   . Factor V Leiden deficiency Mother   . Hypertension Father   . Diabetes Father   . Aneurysm Father   . Diabetes Maternal Grandmother   . Celiac disease Daughter   . Diabetes Paternal Grandfather     History  Substance Use Topics  . Smoking status: Never Smoker   . Smokeless tobacco: Never Used  . Alcohol Use: No    Allergies: No Known Allergies  Prescriptions prior to admission  Medication Sig Dispense Refill  . Prenatal Vit-Fe Fumarate-FA (PNV PRENATAL PLUS MULTIVITAMIN) 27-1 MG TABS Take 1 tablet by mouth once.  30 tablet  11    Review of Systems  Gastrointestinal: Positive for abdominal pain.   Physical Exam   Blood pressure 143/84, pulse 105, temperature 98.5 F (36.9 C), temperature source Oral,  resp. rate 22, height 5\' 7"  (1.702 m), weight 123.378 kg (272 lb), last menstrual period 12/07/2012, SpO2 100.00%.  Physical Exam  Nursing note and vitals reviewed. Constitutional: She is oriented to person, place, and time. She appears well-developed and well-nourished. No distress.  Cardiovascular: Normal rate.   Respiratory: Effort normal.  GI: Soft. There is no tenderness. There is no rebound and no guarding.  Genitourinary:   Closed/thick/high   Neurological: She is alert and oriented to person, place, and time.  Skin: Skin is warm and dry.  Psychiatric: She has a normal mood and affect.   FHT: `40, moderate with 15x 15 accels, no decels Toco, no UCs  MAU Course  Procedures  Results for orders placed during the hospital encounter of 07/19/13 (from the past 24 hour(s))  URINALYSIS, ROUTINE W REFLEX MICROSCOPIC     Status: Abnormal   Collection Time    07/19/13  8:05 PM      Result Value Ref Range   Color, Urine YELLOW  YELLOW   APPearance HAZY (*) CLEAR   Specific Gravity, Urine 1.015  1.005 - 1.030   pH 6.0  5.0 - 8.0   Glucose, UA NEGATIVE  NEGATIVE mg/dL   Hgb urine dipstick NEGATIVE  NEGATIVE   Bilirubin Urine  NEGATIVE  NEGATIVE   Ketones, ur NEGATIVE  NEGATIVE mg/dL   Protein, ur NEGATIVE  NEGATIVE mg/dL   Urobilinogen, UA 0.2  0.0 - 1.0 mg/dL   Nitrite NEGATIVE  NEGATIVE   Leukocytes, UA MODERATE (*) NEGATIVE  URINE MICROSCOPIC-ADD ON     Status: Abnormal   Collection Time    07/19/13  8:05 PM      Result Value Ref Range   Squamous Epithelial / LPF MANY (*) RARE   WBC, UA 7-10  <3 WBC/hpf   RBC / HPF 0-2  <3 RBC/hpf   Bacteria, UA RARE  RARE     Assessment and Plan   1. Supervision of other normal pregnancy   2. Abdominal pain complicating pregnancy    Third trimester precautions reviewed PTL precautions Fetal kick counts Return to MAU as needed  Follow-up Information   Follow up with [facility]. (As scheduled)    Specialty:  Obstetrics  and Gynecology   Contact information:   [address] [place] [id number] [phone]       Tawnya CrookHeather Donovan Elonda Giuliano 07/19/2013, 9:20 PM

## 2013-07-19 NOTE — MAU Note (Signed)
Pt reports sharp burning pain around umbilicus. States it has been happening off/on but has worsened for the last 3 hours.

## 2013-07-20 NOTE — MAU Provider Note (Signed)
Attestation of Attending Supervision of Advanced Practitioner (PA/CNM/NP): Evaluation and management procedures were performed by the Advanced Practitioner under my supervision and collaboration.  I have reviewed the Advanced Practitioner's note and chart, and I agree with the management and plan.  Reva Boresanya S Pratt, MD [facility] Attending 07/20/2013 2:38 AM

## 2013-07-21 ENCOUNTER — Encounter: Payer: Self-pay | Admitting: Women's Health

## 2013-07-21 ENCOUNTER — Ambulatory Visit (INDEPENDENT_AMBULATORY_CARE_PROVIDER_SITE_OTHER): Payer: BC Managed Care – PPO | Admitting: Women's Health

## 2013-07-21 ENCOUNTER — Encounter: Payer: BC Managed Care – PPO | Admitting: Women's Health

## 2013-07-21 VITALS — BP 132/68 | Wt 271.5 lb

## 2013-07-21 DIAGNOSIS — O09899 Supervision of other high risk pregnancies, unspecified trimester: Secondary | ICD-10-CM

## 2013-07-21 DIAGNOSIS — O09299 Supervision of pregnancy with other poor reproductive or obstetric history, unspecified trimester: Secondary | ICD-10-CM

## 2013-07-21 DIAGNOSIS — Z331 Pregnant state, incidental: Secondary | ICD-10-CM

## 2013-07-21 DIAGNOSIS — O99019 Anemia complicating pregnancy, unspecified trimester: Secondary | ICD-10-CM

## 2013-07-21 DIAGNOSIS — Z3480 Encounter for supervision of other normal pregnancy, unspecified trimester: Secondary | ICD-10-CM

## 2013-07-21 DIAGNOSIS — Z1389 Encounter for screening for other disorder: Secondary | ICD-10-CM

## 2013-07-21 DIAGNOSIS — O99340 Other mental disorders complicating pregnancy, unspecified trimester: Secondary | ICD-10-CM

## 2013-07-21 LAB — POCT URINALYSIS DIPSTICK
GLUCOSE UA: NEGATIVE
Ketones, UA: NEGATIVE
Leukocytes, UA: NEGATIVE
Nitrite, UA: NEGATIVE
Protein, UA: NEGATIVE

## 2013-07-21 NOTE — Progress Notes (Signed)
Reports good fm. Denies uc's, lof, vb, uti s/s.  [facility], more on Lt side- burning, told [facility] pain and to try abd binder, working on getting binder. No obvious hernia. Went to wb classes, still interested, will discuss more around 36wks. Reviewed ptl s/s, fkc.  All questions answered. F/U in 2wks for visit.

## 2013-07-21 NOTE — Patient Instructions (Signed)
Third Trimester of Pregnancy  The third trimester is from week 29 through week 42, months 7 through 9. The third trimester is a time when the fetus is growing rapidly. At the end of the ninth month, the fetus is about 20 inches in length and weighs 6 10 pounds.   BODY CHANGES  Your body goes through many changes during pregnancy. The changes vary from woman to woman.    Your weight will continue to increase. You can expect to gain 25 35 pounds (11 16 kg) by the end of the pregnancy.   You may begin to get stretch marks on your hips, abdomen, and breasts.   You may urinate more often because the fetus is moving lower into your pelvis and pressing on your bladder.   You may develop or continue to have heartburn as a result of your pregnancy.   You may develop constipation because certain hormones are causing the muscles that push waste through your intestines to slow down.   You may develop hemorrhoids or swollen, bulging veins (varicose veins).   You may have pelvic pain because of the weight gain and pregnancy hormones relaxing your joints between the bones in your pelvis. Back aches may result from over exertion of the muscles supporting your posture.   Your breasts will continue to grow and be tender. A yellow discharge may leak from your breasts called colostrum.   Your belly button may stick out.   You may feel short of breath because of your expanding uterus.   You may notice the fetus "dropping," or moving lower in your abdomen.   You may have a bloody mucus discharge. This usually occurs a few days to a week before labor begins.   Your cervix becomes thin and soft (effaced) near your due date.  WHAT TO EXPECT AT YOUR PRENATAL EXAMS   You will have prenatal exams every 2 weeks until week 36. Then, you will have weekly prenatal exams. During a routine prenatal visit:   You will be weighed to make sure you and the fetus are growing normally.   Your blood pressure is taken.   Your abdomen will be  measured to track your baby's growth.   The fetal heartbeat will be listened to.   Any test results from the previous visit will be discussed.   You may have a cervical check near your due date to see if you have effaced.  At around 36 weeks, your caregiver will check your cervix. At the same time, your caregiver will also perform a test on the secretions of the vaginal tissue. This test is to determine if a type of bacteria, Group B streptococcus, is present. Your caregiver will explain this further.  Your caregiver may ask you:   What your birth plan is.   How you are feeling.   If you are feeling the baby move.   If you have had any abnormal symptoms, such as leaking fluid, bleeding, severe headaches, or abdominal cramping.   If you have any questions.  Other tests or screenings that may be performed during your third trimester include:   Blood tests that check for low iron levels (anemia).   Fetal testing to check the health, activity level, and growth of the fetus. Testing is done if you have certain medical conditions or if there are problems during the pregnancy.  FALSE LABOR  You may feel small, irregular contractions that eventually go away. These are called Braxton Hicks contractions, or   false labor. Contractions may last for hours, days, or even weeks before true labor sets in. If contractions come at regular intervals, intensify, or become painful, it is best to be seen by your caregiver.   SIGNS OF LABOR    Menstrual-like cramps.   Contractions that are 5 minutes apart or less.   Contractions that start on the top of the uterus and spread down to the lower abdomen and back.   A sense of increased pelvic pressure or back pain.   A watery or bloody mucus discharge that comes from the vagina.  If you have any of these signs before the 37th week of pregnancy, call your caregiver right away. You need to go to the hospital to get checked immediately.  HOME CARE INSTRUCTIONS    Avoid all  smoking, herbs, alcohol, and unprescribed drugs. These chemicals affect the formation and growth of the baby.   Follow your caregiver's instructions regarding medicine use. There are medicines that are either safe or unsafe to take during pregnancy.   Exercise only as directed by your caregiver. Experiencing uterine cramps is a good sign to stop exercising.   Continue to eat regular, healthy meals.   Wear a good support bra for breast tenderness.   Do not use hot tubs, steam rooms, or saunas.   Wear your seat belt at all times when driving.   Avoid raw meat, uncooked cheese, cat litter boxes, and soil used by cats. These carry germs that can cause birth defects in the baby.   Take your prenatal vitamins.   Try taking a stool softener (if your caregiver approves) if you develop constipation. Eat more high-fiber foods, such as fresh vegetables or fruit and whole grains. Drink plenty of fluids to keep your urine clear or pale yellow.   Take warm sitz baths to soothe any pain or discomfort caused by hemorrhoids. Use hemorrhoid cream if your caregiver approves.   If you develop varicose veins, wear support hose. Elevate your feet for 15 minutes, 3 4 times a day. Limit salt in your diet.   Avoid heavy lifting, wear low heal shoes, and practice good posture.   Rest a lot with your legs elevated if you have leg cramps or low back pain.   Visit your dentist if you have not gone during your pregnancy. Use a soft toothbrush to brush your teeth and be gentle when you floss.   A sexual relationship may be continued unless your caregiver directs you otherwise.   Do not travel far distances unless it is absolutely necessary and only with the approval of your caregiver.   Take prenatal classes to understand, practice, and ask questions about the labor and delivery.   Make a trial run to the hospital.   Pack your hospital bag.   Prepare the baby's nursery.   Continue to go to all your prenatal visits as directed  by your caregiver.  SEEK MEDICAL CARE IF:   You are unsure if you are in labor or if your water has broken.   You have dizziness.   You have mild pelvic cramps, pelvic pressure, or nagging pain in your abdominal area.   You have persistent nausea, vomiting, or diarrhea.   You have a bad smelling vaginal discharge.   You have pain with urination.  SEEK IMMEDIATE MEDICAL CARE IF:    You have a fever.   You are leaking fluid from your vagina.   You have spotting or bleeding from your vagina.     You have severe abdominal cramping or pain.   You have rapid weight loss or gain.   You have shortness of breath with chest pain.   You notice sudden or extreme swelling of your face, hands, ankles, feet, or legs.   You have not felt your baby move in over an hour.   You have severe headaches that do not go away with medicine.   You have vision changes.  Document Released: 03/05/2001 Document Revised: 11/11/2012 Document Reviewed: 05/12/2012  Exit[employer] Patient Information 2014 [employer].

## 2013-08-03 ENCOUNTER — Ambulatory Visit (INDEPENDENT_AMBULATORY_CARE_PROVIDER_SITE_OTHER): Payer: BC Managed Care – PPO | Admitting: Obstetrics & Gynecology

## 2013-08-03 ENCOUNTER — Encounter: Payer: Self-pay | Admitting: Obstetrics & Gynecology

## 2013-08-03 VITALS — BP 118/80 | Wt 272.0 lb

## 2013-08-03 DIAGNOSIS — Z331 Pregnant state, incidental: Secondary | ICD-10-CM

## 2013-08-03 DIAGNOSIS — O09899 Supervision of other high risk pregnancies, unspecified trimester: Secondary | ICD-10-CM

## 2013-08-03 DIAGNOSIS — O99340 Other mental disorders complicating pregnancy, unspecified trimester: Secondary | ICD-10-CM

## 2013-08-03 DIAGNOSIS — O99019 Anemia complicating pregnancy, unspecified trimester: Secondary | ICD-10-CM

## 2013-08-03 DIAGNOSIS — O09299 Supervision of pregnancy with other poor reproductive or obstetric history, unspecified trimester: Secondary | ICD-10-CM

## 2013-08-03 DIAGNOSIS — Z1389 Encounter for screening for other disorder: Secondary | ICD-10-CM

## 2013-08-03 LAB — POCT URINALYSIS DIPSTICK
Blood, UA: NEGATIVE
Glucose, UA: NEGATIVE
Ketones, UA: NEGATIVE
Leukocytes, UA: NEGATIVE
Nitrite, UA: NEGATIVE

## 2013-08-03 NOTE — Progress Notes (Signed)
BP weight and urine results all reviewed and noted. Patient reports good fetal movement, denies any bleeding and no rupture of membranes symptoms or regular contractions. Patient is without complaints. All questions were answered.  Watch growth

## 2013-08-16 ENCOUNTER — Encounter (HOSPITAL_COMMUNITY): Payer: Self-pay | Admitting: *Deleted

## 2013-08-16 ENCOUNTER — Inpatient Hospital Stay (HOSPITAL_COMMUNITY)
Admission: AD | Admit: 2013-08-16 | Discharge: 2013-08-16 | Disposition: A | Discharge status: Home / Self Care | Payer: Medicaid Other | Source: Ambulatory Visit | Attending: Obstetrics & Gynecology | Admitting: Obstetrics & Gynecology

## 2013-08-16 DIAGNOSIS — O99891 Other specified diseases and conditions complicating pregnancy: Secondary | ICD-10-CM | POA: Insufficient documentation

## 2013-08-16 DIAGNOSIS — O9989 Other specified diseases and conditions complicating pregnancy, childbirth and the puerperium: Secondary | ICD-10-CM

## 2013-08-16 DIAGNOSIS — R51 Headache: Secondary | ICD-10-CM | POA: Insufficient documentation

## 2013-08-16 DIAGNOSIS — M549 Dorsalgia, unspecified: Secondary | ICD-10-CM | POA: Insufficient documentation

## 2013-08-16 DIAGNOSIS — O26899 Other specified pregnancy related conditions, unspecified trimester: Secondary | ICD-10-CM

## 2013-08-16 LAB — URINALYSIS, ROUTINE W REFLEX MICROSCOPIC
Bilirubin Urine: NEGATIVE
GLUCOSE, UA: NEGATIVE mg/dL
Hgb urine dipstick: NEGATIVE
Ketones, ur: NEGATIVE mg/dL
Nitrite: NEGATIVE
PH: 6 (ref 5.0–8.0)
Protein, ur: NEGATIVE mg/dL
Specific Gravity, Urine: 1.015 (ref 1.005–1.030)
Urobilinogen, UA: 0.2 mg/dL (ref 0.0–1.0)

## 2013-08-16 LAB — URINE MICROSCOPIC-ADD ON

## 2013-08-16 NOTE — MAU Note (Signed)
Pt reports "feeling like my blood pressure is up."  Slight headache earlier today and back spasms.  Hx of elevated BP with first pregnancy.  Pt to registration.

## 2013-08-16 NOTE — Discharge Instructions (Signed)
Hypertension During Pregnancy Hypertension is also called high blood pressure. It can occur at any time in life and during pregnancy. When you have hypertension, there is extra pressure inside your blood vessels that carry blood from the heart to the rest of your body (arteries). Hypertension during pregnancy can cause problems for you and your baby. Your baby might not weigh as much as it should at birth or might be born early (premature). Very bad cases of hypertension during pregnancy can be life threatening.  Different types of hypertension can occur during pregnancy.   Chronic hypertension. This happens when a woman has hypertension before pregnancy and it continues during pregnancy.  Gestational hypertension. This is when hypertension develops during pregnancy.  Preeclampsia or toxemia of pregnancy. This is a very serious type of hypertension that develops only during pregnancy. It is a disease that affects the whole body (systemic) and can be very dangerous for both mother and baby.  Gestational hypertension and preeclampsia usually go away after your baby is born. Blood pressure generally stabilizes within 6 weeks. Women who have hypertension during pregnancy have a greater chance of developing hypertension later in life or with future pregnancies. RISK FACTORS Some factors make you more likely to develop hypertension during pregnancy. Risk factors include:  Having hypertension before pregnancy.  Having hypertension during a previous pregnancy.  Being overweight.  Being older than 40.  Being pregnant with more than one baby (multiples).  Having diabetes or kidney problems. SIGNS AND SYMPTOMS Chronic and gestational hypertension rarely cause symptoms. Preeclampsia has symptoms, which may include:  Increased protein in your urine. Your health care provider will check for this at every prenatal visit.  Swelling of your hands and face.  Rapid weight gain.  Headaches.  Visual  changes.  Being bothered by light.  Abdominal pain, especially in the right upper area.  Chest pain.  Shortness of breath.  Increased reflexes.  Seizures. Seizures occur with a more severe form of preeclampsia, called eclampsia. DIAGNOSIS   You may be diagnosed with hypertension during a regular prenatal exam. At each visit, tests may include:  Blood pressure checks.  A urine test to check for protein in your urine.  The type of hypertension you are diagnosed with depends on when you developed it. It also depends on your specific blood pressure reading.  Developing hypertension before 20 weeks of pregnancy is consistent with chronic hypertension.  Developing hypertension after 20 weeks of pregnancy is consistent with gestational hypertension.  Hypertension with increased urinary protein is diagnosed as preeclampsia.  Blood pressure measurements that stay above 160 systolic or 110 diastolic are a sign of severe preeclampsia. TREATMENT Treatment for hypertension during pregnancy varies. Treatment depends on the type of hypertension and how serious it is.  If you take medicine for chronic hypertension, you may need to switch medicines.  Drugs called ACE inhibitors should not be taken during pregnancy.  Low-dose aspirin may be suggested for women who have risk factors for preeclampsia.  If you have gestational hypertension, you may need to take a blood pressure medicine that is safe during pregnancy. Your health care provider will recommend the appropriate medicine.  If you have severe preeclampsia, you may need to be in the hospital. Health care providers will watch you and the baby very closely. You also may need to take medicine (magnesium sulfate) to prevent seizures and lower blood pressure.  Sometimes an early delivery is needed. This may be the case if the condition worsens. It would   be done to protect you and the baby. The only cure for preeclampsia is delivery. HOME  CARE INSTRUCTIONS  Schedule and keep all of your regular appointments for prenatal care.  Only take over-the-counter or prescription medicines as directed by your health care provider. Tell your health care provider about all medicines you take.  Eat as little salt as possible.  Get regular exercise.  Do not drink alcohol.  Do not use tobacco products.  Do not drink products with caffeine.  Lie on your left side when resting. SEEK IMMEDIATE MEDICAL CARE IF:  You have severe abdominal pain.  You have sudden swelling in the hands, ankles, or face.  You gain 4 pounds (1.8 kg) or more in 1 week.  You vomit repeatedly.  You have vaginal bleeding.  You do not feel the baby moving as much.  You have a headache.  You have blurred or double vision.  You have muscle twitching or spasms.  You have shortness of breath.  You have blue fingernails and lips.  You have blood in your urine. MAKE SURE YOU:  Understand these instructions.  Will watch your condition.  Will get help right away if you are not doing well or get worse. Document Released: 11/27/2010 Document Revised: 12/30/2012 Document Reviewed: 10/08/2012 [employer] Patient Information 2014 [employer].  

## 2013-08-16 NOTE — MAU Provider Note (Signed)
Chief Complaint:  Headache and Back Pain   First Provider Initiated Contact with Patient 08/16/13 2003      HPI: Julia Johnson is a 33 y.o. G3P1011 at 4258w6d pt of FT who presents to maternity admissions reporting headache this morning which has now resolved but blood pressures at home of 150s/100s following the headache.  She also reports back pain and pelvic pressure today.  She reports good fetal movement, denies regular contractions, h/a, visual disturbances, epigastric pain, LOF, vaginal bleeding, vaginal itching/burning, urinary symptoms, dizziness, n/v, or fever/chills.   Past Medical History: Past Medical History  Diagnosis Date  . Carpal tunnel syndrome   . Depression   . ADHD (attention deficit hyperactivity disorder)   . Hyperlipidemia   . Obesity   . Anxiety   . UTI (urinary tract infection)     Past obstetric history: OB History  Gravida Para Term Preterm AB SAB TAB Ectopic Multiple Living  3 1 1  1 1    1     # Outcome Date GA Lbr Len/2nd Weight Sex Delivery Anes PTL Lv  3 CUR           2 SAB 05/10/12          1 TRM 05/09/06 [id number]  3.827 kg (8 lb 7 oz) F SVD None  Y     Comments: BPs had started to elevate, but no pre-e      Past Surgical History: Past Surgical History  Procedure Laterality Date  . Mouth surgery      Family History: Family History  Problem Relation Age of Onset  . Cancer Mother     breast  . Diabetes Mother   . Factor V Leiden deficiency Mother   . Hypertension Father   . Diabetes Father   . Aneurysm Father   . Diabetes Maternal Grandmother   . Celiac disease Daughter   . Diabetes Paternal Grandfather     Social History: History  Substance Use Topics  . Smoking status: Never Smoker   . Smokeless tobacco: Never Used  . Alcohol Use: No    Allergies: No Known Allergies  Meds:  Prescriptions prior to admission  Medication Sig Dispense Refill  . acetaminophen (TYLENOL) 500 MG tablet Take 500 mg by mouth as needed.      .  Prenatal Vit-Fe Fumarate-FA (PNV PRENATAL PLUS MULTIVITAMIN) 27-1 MG TABS Take 1 tablet by mouth once.  30 tablet  11    ROS: Pertinent findings in history of present illness.  Physical Exam  Blood pressure 122/72, pulse 98, resp. rate 18, height 5\' 7"  (1.702 m), weight 126.372 kg (278 lb 9.6 oz), last menstrual period 12/07/2012. Patient Vitals for the past 24 hrs:  BP Pulse Resp Height Weight  08/16/13 2042 122/72 mmHg 98 18 - -  08/16/13 2003 106/62 mmHg 102 - - -  08/16/13 2002 106/62 mmHg 108 - - -  08/16/13 1957 113/66 mmHg 103 - - -  08/16/13 1932 131/71 mmHg 101 - - -  08/16/13 1923 131/88 mmHg 108 - - -  08/16/13 1918 - - - 5\' 7"  (1.702 m) 126.372 kg (278 lb 9.6 oz)  08/16/13 1916 - - - 5\' 7"  (1.702 m) 126.1 kg (278 lb)   GENERAL: Well-developed, well-nourished female in no acute distress.  HEENT: normocephalic HEART: normal rate RESP: normal effort ABDOMEN: Soft, non-tender, gravid appropriate for gestational age EXTREMITIES: Nontender, no edema NEURO: alert and oriented  Dilation: Fingertip Effacement (%): Thick Cervical Position: Posterior Station: [employer]  Exam by:: Leftwich-Kirby, CNM  FHT:  Baseline 145, moderate variability, accelerations present, no decelerations Contractions: None on toco or to palpation   Labs: Results for orders placed during the hospital encounter of 08/16/13 (from the past 24 hour(s))  URINALYSIS, ROUTINE W REFLEX MICROSCOPIC     Status: Abnormal   Collection Time    08/16/13  7:10 PM      Result Value Ref Range   Color, Urine YELLOW  YELLOW   APPearance CLEAR  CLEAR   Specific Gravity, Urine 1.015  1.005 - 1.030   pH 6.0  5.0 - 8.0   Glucose, UA NEGATIVE  NEGATIVE mg/dL   Hgb urine dipstick NEGATIVE  NEGATIVE   Bilirubin Urine NEGATIVE  NEGATIVE   Ketones, ur NEGATIVE  NEGATIVE mg/dL   Protein, ur NEGATIVE  NEGATIVE mg/dL   Urobilinogen, UA 0.2  0.0 - 1.0 mg/dL   Nitrite NEGATIVE  NEGATIVE   Leukocytes, UA TRACE (*)  NEGATIVE  URINE MICROSCOPIC-ADD ON     Status: None   Collection Time    08/16/13  7:10 PM      Result Value Ref Range   Squamous Epithelial / LPF RARE  RARE   WBC, UA 3-6  <3 WBC/hpf   RBC / HPF 0-2  <3 RBC/hpf   Bacteria, UA RARE  RARE    Assessment: 1. Headache in pregnancy     Plan: Consult Dr Macon Large Discharge home PTL precautions, preeclampsia precautions, and fetal kick counts Recommend change cuff size for home BP monitor, and limit home BP checks, call office if concerned about blood pressures  Follow-up Information   Follow up with [facility]. (As scheduled tomorrow.  Return to MAU as needed for emergencies. )    Contact information:   [address] [place] [address] [phone]       Medication List         acetaminophen 500 MG tablet  Commonly known as:  TYLENOL  Take 500 mg by mouth as needed.     PNV PRENATAL PLUS MULTIVITAMIN 27-1 MG Tabs  Take 1 tablet by mouth once.        Sharen Counter Certified Nurse-Midwife 08/16/2013 8:47 PM

## 2013-08-16 NOTE — MAU Provider Note (Signed)
Attestation of Attending Supervision of Advanced Practitioner (PA/CNM/NP): Evaluation and management procedures were performed by the Advanced Practitioner under my supervision and collaboration.  I have reviewed the Advanced Practitioner's note and chart, and I agree with the management and plan.  Liany Mumpower, MD, FACOG Attending Obstetrician & Gynecologist Faculty Practice, [facility]  

## 2013-08-17 ENCOUNTER — Encounter: Payer: Self-pay | Admitting: Advanced Practice Midwife

## 2013-08-17 ENCOUNTER — Ambulatory Visit (INDEPENDENT_AMBULATORY_CARE_PROVIDER_SITE_OTHER): Payer: BC Managed Care – PPO | Admitting: Advanced Practice Midwife

## 2013-08-17 VITALS — BP 140/72 | Wt 278.0 lb

## 2013-08-17 DIAGNOSIS — Z331 Pregnant state, incidental: Secondary | ICD-10-CM

## 2013-08-17 DIAGNOSIS — Z3480 Encounter for supervision of other normal pregnancy, unspecified trimester: Secondary | ICD-10-CM

## 2013-08-17 DIAGNOSIS — Z1389 Encounter for screening for other disorder: Secondary | ICD-10-CM

## 2013-08-17 LAB — POCT URINALYSIS DIPSTICK
Glucose, UA: NEGATIVE
Ketones, UA: NEGATIVE
Leukocytes, UA: NEGATIVE
Nitrite, UA: NEGATIVE
Protein, UA: NEGATIVE
RBC UA: NEGATIVE

## 2013-08-17 NOTE — Patient Instructions (Addendum)
Considering Waterbirth? Guide for patients at [facility]  Why consider waterbirth?  . Gentle birth for babies . Less pain medicine used in labor . May allow for passive descent/less pushing . May reduce perineal tears  . More mobility and instinctive maternal position changes . Increased maternal relaxation . Reduced blood pressure in labor  Is waterbirth safe? What are the risks of infection, drowning or other complications?  . Infection: o Very low risk (3.7 % for tub vs 4.8% for bed) o 7 in 8000 waterbirths with documented infection o Poorly cleaned equipment most common cause o Slightly lower group B strep transmission rate  . Drowning o Maternal:  - Very low risk   - Related to seizures or fainting o Newborn:  - Very low risk. No evidence of increased risk of respiratory problems in multiple large studies - Physiological protection from breathing under water - Avoid underwater birth if there are any fetal complications - Once baby's head is out of the water, keep it out.  . Birth complication o Some reports of cord trauma, but risk decreased by bringing baby to surface gradually o No evidence of increased risk of shoulder dystocia. Mothers can usually change positions faster in water than in a bed, possibly aiding the maneuvers to free the shoulder.   Am I a candidate for waterbirth?  Yes, if you are: . Full-term (37 weeks or greater)  . Have had an uncomplicated pregnancy and labor  No, if you have: . Preterm birth less than 37 weeks . Thick, particulate meconium stained fluid . Maternal fever over 101 . Heavy bleeding or signs of placental abruption . Pre-eclampsia  . Any abnormal fetal heart rate pattern . Breech presentation . Twins  . Very large baby . Active communicable infection (this does NOT include group B strep) . Significant limitation to mobility  Please remember that birth is unpredictable. Under certain unforeseeable  circumstances your provider may advise against giving birth in the tub. These decisions will be made on a case-by-case basis and with the safety of you and your baby as our highest priority.  Requirements for patients planning waterbirth  . Ask your midwife if you will be a candidate for waterbirth. . Attend the Waterbirth Class at [facility]. Contact Childbirth Education at [phone] or [phone] for dates and times. The class is free and we strongly encourage you to bring your support person. You will receive a certificate of participation to show to your midwife or doctor. . Supplies needed for [facility] and [facility] patients: o Single-use disposable tub liner (birthpoolinabox.com  REGULAR size) o New garden hose labeled "lead-free", "suitable for drinking water", "non-toxic" OR "water potable" o Garden hose to remove the dirty water o Faucet adaptor to attach hose to faucet         o Electric drain pump to remove water (We recommend 792 gallon per hour or greater pump.)  o Fish net o Bathing suit top (optional) o Long-handled mirror (optional)  yourwaterbirth.com sells tubs for $120 if you would rather purchase your own tub     

## 2013-08-17 NOTE — Progress Notes (Signed)
Routine questions about pregnancy answered. Size>dates. Watch B/P  F/U in 1 weeks for Low-risk ob/EFW/B/P

## 2013-08-18 ENCOUNTER — Telehealth: Payer: Self-pay | Admitting: *Deleted

## 2013-08-18 NOTE — Telephone Encounter (Signed)
Pt states headache started at 10 amtook tylenol feeling some better,possibly allergy related, b/p yesterday at her appt 140/72, +FM. Pt encouraged to push fluids, can take Zyrtec or Claritin, continue tylenol as needed. Pt informed if headache reoccurs and is not relieved with tylenol call our office back. Pt verbalized understanding.

## 2013-08-24 ENCOUNTER — Other Ambulatory Visit: Payer: Self-pay | Admitting: Advanced Practice Midwife

## 2013-08-24 DIAGNOSIS — O26849 Uterine size-date discrepancy, unspecified trimester: Secondary | ICD-10-CM

## 2013-08-26 ENCOUNTER — Encounter: Payer: Self-pay | Admitting: Advanced Practice Midwife

## 2013-08-26 ENCOUNTER — Ambulatory Visit (INDEPENDENT_AMBULATORY_CARE_PROVIDER_SITE_OTHER): Payer: BC Managed Care – PPO

## 2013-08-26 ENCOUNTER — Encounter: Payer: BC Managed Care – PPO | Admitting: Advanced Practice Midwife

## 2013-08-26 ENCOUNTER — Other Ambulatory Visit: Payer: BC Managed Care – PPO

## 2013-08-26 ENCOUNTER — Ambulatory Visit (INDEPENDENT_AMBULATORY_CARE_PROVIDER_SITE_OTHER): Payer: BC Managed Care – PPO | Admitting: Advanced Practice Midwife

## 2013-08-26 VITALS — BP 140/80 | Wt 282.0 lb

## 2013-08-26 DIAGNOSIS — O133 Gestational [pregnancy-induced] hypertension without significant proteinuria, third trimester: Secondary | ICD-10-CM

## 2013-08-26 DIAGNOSIS — O0993 Supervision of high risk pregnancy, unspecified, third trimester: Secondary | ICD-10-CM

## 2013-08-26 DIAGNOSIS — Z1389 Encounter for screening for other disorder: Secondary | ICD-10-CM

## 2013-08-26 DIAGNOSIS — O26849 Uterine size-date discrepancy, unspecified trimester: Secondary | ICD-10-CM

## 2013-08-26 DIAGNOSIS — O139 Gestational [pregnancy-induced] hypertension without significant proteinuria, unspecified trimester: Secondary | ICD-10-CM

## 2013-08-26 DIAGNOSIS — O09899 Supervision of other high risk pregnancies, unspecified trimester: Secondary | ICD-10-CM

## 2013-08-26 LAB — POCT URINALYSIS DIPSTICK
GLUCOSE UA: NEGATIVE
KETONES UA: NEGATIVE
Leukocytes, UA: NEGATIVE
Nitrite, UA: NEGATIVE
Protein, UA: NEGATIVE
RBC UA: NEGATIVE

## 2013-08-26 NOTE — Progress Notes (Signed)
High Risk Pregnancy Diagnosis(es):   GHTN  G3P1011 [redacted]w[redacted]d Estimated Date of Delivery: 09/21/13  Blood pressure 140/80, weight 282 lb (127.914 kg), last menstrual period 12/07/2012.  Urinalysis:     Component Value Date/Time   COLORURINE YELLOW 08/16/2013 1910   APPEARANCEUR CLEAR 08/16/2013 1910   LABSPEC 1.015 08/16/2013 1910   PHURINE 6.0 08/16/2013 1910   GLUCOSEU NEGATIVE 08/16/2013 1910   HGBUR NEGATIVE 08/16/2013 1910   BILIRUBINUR NEGATIVE 08/16/2013 1910   KETONESUR NEGATIVE 08/16/2013 1910   PROTEINUR neg 08/26/2013 1334   PROTEINUR NEGATIVE 08/16/2013 1910   UROBILINOGEN 0.2 08/16/2013 1910   NITRITE neg 08/26/2013 1334   NITRITE NEGATIVE 08/16/2013 1910   LEUKOCYTESUR Negative 08/26/2013 1334    Chief Complaint:   Chief Complaint  Patient presents with  . Routine Prenatal Visit    pain in pelvic/groin area; hurts to walk   HPI:     U/S today for size>dates.  AFI high normal, EFW 97%.  BP weight and urine results all reviewed and noted. Her B/P is elevated today for the second time, meeting criteria for GHTN. Patient reports good fetal movement, denies any bleeding and no rupture of membranes symptoms or regular contractions.  Fundal Height:  EFW 97% Fetal Heart rate:  155 Edema:  Trace  All questions were answered.  Relief measures for groin pain discussed  Assessment:  GHTN @ 36.2 weeks   Impending Macrosomia  Medication(s) Plans:  none  Treatment Plan:  Plan:  TWICE WEEKLY TESTING d/t GHTN; consider IOL at 37 weeks  Follow up in Monday for NST

## 2013-08-26 NOTE — Progress Notes (Signed)
U/S(36+2wks)-vtx active fetus, EFW 7 lb 14 oz (97th%tile), fluid (upper normal limits) AFI-21.3cm SDP-9.0cm, FHR-157 bpm, female fetus, anterior Gr 2 placenta

## 2013-08-26 NOTE — Patient Instructions (Signed)
Hypertension During Pregnancy Hypertension is also called high blood pressure. It can occur at any time in life and during pregnancy. When you have hypertension, there is extra pressure inside your blood vessels that carry blood from the heart to the rest of your body (arteries). Hypertension during pregnancy can cause problems for you and your baby. Your baby might not weigh as much as it should at birth or might be born early (premature). Very bad cases of hypertension during pregnancy can be life threatening.  Different types of hypertension can occur during pregnancy.   Chronic hypertension. This happens when a woman has hypertension before pregnancy and it continues during pregnancy.  Gestational hypertension. This is when hypertension develops during pregnancy.  Preeclampsia or toxemia of pregnancy. This is a very serious type of hypertension that develops only during pregnancy. It is a disease that affects the whole body (systemic) and can be very dangerous for both mother and baby.  Gestational hypertension and preeclampsia usually go away after your baby is born. Blood pressure generally stabilizes within 6 weeks. Women who have hypertension during pregnancy have a greater chance of developing hypertension later in life or with future pregnancies. RISK FACTORS Some factors make you more likely to develop hypertension during pregnancy. Risk factors include:  Having hypertension before pregnancy.  Having hypertension during a previous pregnancy.  Being overweight.  Being older than 40.  Being pregnant with more than one baby (multiples).  Having diabetes or kidney problems. SIGNS AND SYMPTOMS Chronic and gestational hypertension rarely cause symptoms. Preeclampsia has symptoms, which may include:  Increased protein in your urine. Your health care provider will check for this at every prenatal visit.  Swelling of your hands and face.  Rapid weight gain.  Headaches.  Visual  changes.  Being bothered by light.  Abdominal pain, especially in the right upper area.  Chest pain.  Shortness of breath.  Increased reflexes.  Seizures. Seizures occur with a more severe form of preeclampsia, called eclampsia. DIAGNOSIS   You may be diagnosed with hypertension during a regular prenatal exam. At each visit, tests may include:  Blood pressure checks.  A urine test to check for protein in your urine.  The type of hypertension you are diagnosed with depends on when you developed it. It also depends on your specific blood pressure reading.  Developing hypertension before 20 weeks of pregnancy is consistent with chronic hypertension.  Developing hypertension after 20 weeks of pregnancy is consistent with gestational hypertension.  Hypertension with increased urinary protein is diagnosed as preeclampsia.  Blood pressure measurements that stay above 160 systolic or 110 diastolic are a sign of severe preeclampsia. TREATMENT Treatment for hypertension during pregnancy varies. Treatment depends on the type of hypertension and how serious it is.  If you take medicine for chronic hypertension, you may need to switch medicines.  Drugs called ACE inhibitors should not be taken during pregnancy.  Low-dose aspirin may be suggested for women who have risk factors for preeclampsia.  If you have gestational hypertension, you may need to take a blood pressure medicine that is safe during pregnancy. Your health care provider will recommend the appropriate medicine.  If you have severe preeclampsia, you may need to be in the hospital. Health care providers will watch you and the baby very closely. You also may need to take medicine (magnesium sulfate) to prevent seizures and lower blood pressure.  Sometimes an early delivery is needed. This may be the case if the condition worsens. It would   be done to protect you and the baby. The only cure for preeclampsia is delivery. HOME  CARE INSTRUCTIONS  Schedule and keep all of your regular appointments for prenatal care.  Only take over-the-counter or prescription medicines as directed by your health care provider. Tell your health care provider about all medicines you take.  Eat as little salt as possible.  Get regular exercise.  Do not drink alcohol.  Do not use tobacco products.  Do not drink products with caffeine.  Lie on your left side when resting. SEEK IMMEDIATE MEDICAL CARE IF:  You have severe abdominal pain.  You have sudden swelling in the hands, ankles, or face.  You gain 4 pounds (1.8 kg) or more in 1 week.  You vomit repeatedly.  You have vaginal bleeding.  You do not feel the baby moving as much.  You have a headache.  You have blurred or double vision.  You have muscle twitching or spasms.  You have shortness of breath.  You have blue fingernails and lips.  You have blood in your urine. MAKE SURE YOU:  Understand these instructions.  Will watch your condition.  Will get help right away if you are not doing well or get worse. Document Released: 11/27/2010 Document Revised: 12/30/2012 Document Reviewed: 10/08/2012 [employer] Patient Information 2014 [employer].  

## 2013-08-29 ENCOUNTER — Inpatient Hospital Stay (HOSPITAL_COMMUNITY)
Admission: AD | Admit: 2013-08-29 | Discharge: 2013-08-29 | Disposition: A | Discharge status: Home / Self Care | Payer: BC Managed Care – PPO | Source: Ambulatory Visit | Attending: Obstetrics & Gynecology | Admitting: Obstetrics & Gynecology

## 2013-08-29 ENCOUNTER — Encounter (HOSPITAL_COMMUNITY): Payer: Self-pay | Admitting: *Deleted

## 2013-08-29 DIAGNOSIS — G5600 Carpal tunnel syndrome, unspecified upper limb: Secondary | ICD-10-CM | POA: Insufficient documentation

## 2013-08-29 DIAGNOSIS — R519 Headache, unspecified: Secondary | ICD-10-CM

## 2013-08-29 DIAGNOSIS — O10019 Pre-existing essential hypertension complicating pregnancy, unspecified trimester: Secondary | ICD-10-CM

## 2013-08-29 DIAGNOSIS — Z803 Family history of malignant neoplasm of breast: Secondary | ICD-10-CM

## 2013-08-29 DIAGNOSIS — O139 Gestational [pregnancy-induced] hypertension without significant proteinuria, unspecified trimester: Secondary | ICD-10-CM | POA: Insufficient documentation

## 2013-08-29 DIAGNOSIS — Z3480 Encounter for supervision of other normal pregnancy, unspecified trimester: Secondary | ICD-10-CM

## 2013-08-29 DIAGNOSIS — R51 Headache: Secondary | ICD-10-CM | POA: Diagnosis present

## 2013-08-29 DIAGNOSIS — R42 Dizziness and giddiness: Secondary | ICD-10-CM

## 2013-08-29 DIAGNOSIS — F418 Other specified anxiety disorders: Secondary | ICD-10-CM

## 2013-08-29 LAB — URINE MICROSCOPIC-ADD ON

## 2013-08-29 LAB — COMPREHENSIVE METABOLIC PANEL
ALK PHOS: 70 U/L (ref 39–117)
ALT: 18 U/L (ref 0–35)
AST: 20 U/L (ref 0–37)
Albumin: 2.3 g/dL — ABNORMAL LOW (ref 3.5–5.2)
BUN: 11 mg/dL (ref 6–23)
CHLORIDE: 100 meq/L (ref 96–112)
CO2: 23 mEq/L (ref 19–32)
Calcium: 8.8 mg/dL (ref 8.4–10.5)
Creatinine, Ser: 0.76 mg/dL (ref 0.50–1.10)
GLUCOSE: 95 mg/dL (ref 70–99)
Potassium: 4.1 mEq/L (ref 3.7–5.3)
SODIUM: 136 meq/L — AB (ref 137–147)
Total Bilirubin: 0.4 mg/dL (ref 0.3–1.2)
Total Protein: 5.5 g/dL — ABNORMAL LOW (ref 6.0–8.3)

## 2013-08-29 LAB — URINALYSIS, ROUTINE W REFLEX MICROSCOPIC
Bilirubin Urine: NEGATIVE
GLUCOSE, UA: NEGATIVE mg/dL
Hgb urine dipstick: NEGATIVE
Ketones, ur: NEGATIVE mg/dL
Nitrite: NEGATIVE
PH: 6 (ref 5.0–8.0)
Protein, ur: NEGATIVE mg/dL
Specific Gravity, Urine: 1.01 (ref 1.005–1.030)
Urobilinogen, UA: 0.2 mg/dL (ref 0.0–1.0)

## 2013-08-29 LAB — PROTEIN / CREATININE RATIO, URINE
Creatinine, Urine: 37.51 mg/dL
Protein Creatinine Ratio: 0.11 (ref 0.00–0.15)
Total Protein, Urine: 4 mg/dL

## 2013-08-29 LAB — CBC
HCT: 34.9 % — ABNORMAL LOW (ref 36.0–46.0)
HEMOGLOBIN: 11.3 g/dL — AB (ref 12.0–15.0)
MCH: 28.3 pg (ref 26.0–34.0)
MCHC: 32.4 g/dL (ref 30.0–36.0)
MCV: 87.5 fL (ref 78.0–100.0)
PLATELETS: 162 10*3/uL (ref 150–400)
RBC: 3.99 MIL/uL (ref 3.87–5.11)
RDW: 15.8 % — ABNORMAL HIGH (ref 11.5–15.5)
WBC: 11.5 10*3/uL — AB (ref 4.0–10.5)

## 2013-08-29 MED ORDER — BUTALBITAL-APAP-CAFFEINE 50-325-40 MG PO TABS
1.0000 | ORAL_TABLET | Freq: Once | ORAL | Status: AC
  Administered 2013-08-29: 1 via ORAL
  Filled 2013-08-29: qty 1

## 2013-08-29 MED ORDER — BUTALBITAL-APAP-CAFFEINE 50-300-40 MG PO CAPS: 1.0000 | ORAL_CAPSULE | Freq: Three times a day (TID) | ORAL | Status: DC | PRN

## 2013-08-29 NOTE — MAU Note (Signed)
Pt. States she has had a headache since yesterday that tylenol did not take away. Last took 500mg  Tylenol at 0900 today. Also, states she has been resting a lot. Pt. Describes a "swimmy head" in which she feels her vision is blurred on the sides. Also, feels that she is seeing an increase in floaters. Was just recently diagnosed with gestational hypertension (Thursday) but is not on any medications. Also, will have a NST tomorrow. Baby is moving well. Pt. States her face is swollen more per her husband and mother.

## 2013-08-29 NOTE — MAU Note (Signed)
Pt presents to MAU with complaints of headache and dizziness, states she was diagnosed with high blood pressure on Thursday. She has taken tylenol every 6 hours for headache and the headache doesn't go away completely.

## 2013-08-29 NOTE — Discharge Instructions (Signed)
Hypertension During Pregnancy Hypertension is also called high blood pressure. It can occur at any time in life and during pregnancy. When you have hypertension, there is extra pressure inside your blood vessels that carry blood from the heart to the rest of your body (arteries). Hypertension during pregnancy can cause problems for you and your baby. Your baby might not weigh as much as it should at birth or might be born early (premature). Very bad cases of hypertension during pregnancy can be life threatening.  Different types of hypertension can occur during pregnancy.   Chronic hypertension. This happens when a woman has hypertension before pregnancy and it continues during pregnancy.  Gestational hypertension. This is when hypertension develops during pregnancy.  Preeclampsia or toxemia of pregnancy. This is a very serious type of hypertension that develops only during pregnancy. It is a disease that affects the whole body (systemic) and can be very dangerous for both mother and baby.  Gestational hypertension and preeclampsia usually go away after your baby is born. Blood pressure generally stabilizes within 6 weeks. Women who have hypertension during pregnancy have a greater chance of developing hypertension later in life or with future pregnancies. RISK FACTORS Some factors make you more likely to develop hypertension during pregnancy. Risk factors include:  Having hypertension before pregnancy.  Having hypertension during a previous pregnancy.  Being overweight.  Being older than 40.  Being pregnant with more than one baby (multiples).  Having diabetes or kidney problems. SIGNS AND SYMPTOMS Chronic and gestational hypertension rarely cause symptoms. Preeclampsia has symptoms, which may include:  Increased protein in your urine. Your health care provider will check for this at every prenatal visit.  Swelling of your hands and face.  Rapid weight gain.  Headaches.  Visual  changes.  Being bothered by light.  Abdominal pain, especially in the right upper area.  Chest pain.  Shortness of breath.  Increased reflexes.  Seizures. Seizures occur with a more severe form of preeclampsia, called eclampsia. DIAGNOSIS   You may be diagnosed with hypertension during a regular prenatal exam. At each visit, tests may include:  Blood pressure checks.  A urine test to check for protein in your urine.  The type of hypertension you are diagnosed with depends on when you developed it. It also depends on your specific blood pressure reading.  Developing hypertension before 20 weeks of pregnancy is consistent with chronic hypertension.  Developing hypertension after 20 weeks of pregnancy is consistent with gestational hypertension.  Hypertension with increased urinary protein is diagnosed as preeclampsia.  Blood pressure measurements that stay above 160 systolic or 110 diastolic are a sign of severe preeclampsia. TREATMENT Treatment for hypertension during pregnancy varies. Treatment depends on the type of hypertension and how serious it is.  If you take medicine for chronic hypertension, you may need to switch medicines.  Drugs called ACE inhibitors should not be taken during pregnancy.  Low-dose aspirin may be suggested for women who have risk factors for preeclampsia.  If you have gestational hypertension, you may need to take a blood pressure medicine that is safe during pregnancy. Your health care provider will recommend the appropriate medicine.  If you have severe preeclampsia, you may need to be in the hospital. Health care providers will watch you and the baby very closely. You also may need to take medicine (magnesium sulfate) to prevent seizures and lower blood pressure.  Sometimes an early delivery is needed. This may be the case if the condition worsens. It would   be done to protect you and the baby. The only cure for preeclampsia is delivery. HOME  CARE INSTRUCTIONS  Schedule and keep all of your regular appointments for prenatal care.  Only take over-the-counter or prescription medicines as directed by your health care provider. Tell your health care provider about all medicines you take.  Eat as little salt as possible.  Get regular exercise.  Do not drink alcohol.  Do not use tobacco products.  Do not drink products with caffeine.  Lie on your left side when resting. SEEK IMMEDIATE MEDICAL CARE IF:  You have severe abdominal pain.  You have sudden swelling in the hands, ankles, or face.  You gain 4 pounds (1.8 kg) or more in 1 week.  You vomit repeatedly.  You have vaginal bleeding.  You do not feel the baby moving as much.  You have a headache.  You have blurred or double vision.  You have muscle twitching or spasms.  You have shortness of breath.  You have blue fingernails and lips.  You have blood in your urine. MAKE SURE YOU:  Understand these instructions.  Will watch your condition.  Will get help right away if you are not doing well or get worse. Document Released: 11/27/2010 Document Revised: 12/30/2012 Document Reviewed: 10/08/2012 [employer] Patient Information 2014 [employer].  

## 2013-08-29 NOTE — MAU Provider Note (Signed)
Chief Complaint:  Hypertension, Headache and Dizziness   Julia Johnson is a 33 y.o.  G3P1011 with IUP at [redacted]w[redacted]d presenting for Hypertension, Headache and Dizziness  Pt presentes today for headache and BP concerns. [facility] and [facility] got [facility] and came here. States has had a HA for 24 hours that did not improve with tylenol.  No scotomata, RUQ pain.  Some LEE.       Menstrual History: OB History   Grav Para Term Preterm Abortions TAB SAB Ect Mult Living   3 1 1  1  1   1        Patient's last menstrual period was 12/07/2012.      Past Medical History  Diagnosis Date  . Carpal tunnel syndrome   . Depression   . ADHD (attention deficit hyperactivity disorder)   . Hyperlipidemia   . Obesity   . Anxiety   . UTI (urinary tract infection)     Past Surgical History  Procedure Laterality Date  . Mouth surgery    . Skin biopsy  2012    Family History  Problem Relation Age of Onset  . Cancer Mother     breast  . Diabetes Mother   . Factor V Leiden deficiency Mother   . Hypertension Father   . Diabetes Father   . Aneurysm Father   . Diabetes Maternal Grandmother   . Celiac disease Daughter   . Diabetes Paternal Grandfather     History  Substance Use Topics  . Smoking status: Never Smoker   . Smokeless tobacco: Never Used  . Alcohol Use: No     No Known Allergies  Prescriptions prior to admission  Medication Sig Dispense Refill  . acetaminophen (TYLENOL) 500 MG tablet Take 500 mg by mouth every 4 (four) hours as needed for moderate pain or headache.       . cetirizine (ZYRTEC) 10 MG tablet Take 10 mg by mouth daily as needed for allergies.      . Prenatal Vit-Fe Fumarate-FA (PNV PRENATAL PLUS MULTIVITAMIN) 27-1 MG TABS Take 1 tablet by mouth once.  30 tablet  11    Review of Systems - Negative except for what is mentioned in HPI.  Physical Exam  Blood pressure 144/83, pulse 96, temperature 97.9 F (36.6 C),  resp. rate 18, height 5\' 7"  (1.702 m), weight 128.822 kg (284 lb), last menstrual period 12/07/2012. GENERAL: Well-developed, well-nourished female in no acute distress.  LUNGS: Clear to auscultation bilaterally.  HEART: Regular rate and rhythm. ABDOMEN: Soft, nontender, nondistended, gravid.  EXTREMITIES: Nontender, no edema, 2+ distal pulses. DTRs: 1+, no clonus   FHT:  Baseline rate 150 bpm   Variability moderate  Accelerations present   Decelerations none Contractions: none   Labs: Results for orders placed during the hospital encounter of 08/29/13 (from the past 24 hour(s))  URINALYSIS, ROUTINE W REFLEX MICROSCOPIC   Collection Time    08/29/13  3:45 PM      Result Value Ref Range   Color, Urine YELLOW  YELLOW   APPearance CLEAR  CLEAR   Specific Gravity, Urine 1.010  1.005 - 1.030   pH 6.0  5.0 - 8.0   Glucose, UA NEGATIVE  NEGATIVE mg/dL   Hgb urine dipstick NEGATIVE  NEGATIVE   Bilirubin Urine NEGATIVE  NEGATIVE   Ketones, ur NEGATIVE  NEGATIVE mg/dL   Protein, ur NEGATIVE  NEGATIVE mg/dL   Urobilinogen, UA 0.2  0.0 - 1.0 mg/dL  Nitrite NEGATIVE  NEGATIVE   Leukocytes, UA SMALL (*) NEGATIVE  URINE MICROSCOPIC-ADD ON   Collection Time    08/29/13  3:45 PM      Result Value Ref Range   Squamous Epithelial / LPF FEW (*) RARE   WBC, UA 3-6  <3 WBC/hpf  PROTEIN / CREATININE RATIO, URINE   Collection Time    08/29/13  3:45 PM      Result Value Ref Range   Creatinine, Urine 37.51     Total Protein, Urine 4     PROTEIN CREATININE RATIO 0.11  0.00 - 0.15  CBC   Collection Time    08/29/13  5:10 PM      Result Value Ref Range   WBC 11.5 (*) 4.0 - 10.5 K/uL   RBC 3.99  3.87 - 5.11 MIL/uL   Hemoglobin 11.3 (*) 12.0 - 15.0 g/dL   HCT 86.5 (*) 78.4 - 69.6 %   MCV 87.5  78.0 - 100.0 fL   MCH 28.3  26.0 - 34.0 pg   MCHC 32.4  30.0 - 36.0 g/dL   RDW 29.5 (*) 28.4 - 13.2 %   Platelets 162  150 - 400 K/uL  COMPREHENSIVE METABOLIC PANEL   Collection Time    08/29/13   5:10 PM      Result Value Ref Range   Sodium 136 (*) 137 - 147 mEq/L   Potassium 4.1  3.7 - 5.3 mEq/L   Chloride 100  96 - 112 mEq/L   CO2 23  19 - 32 mEq/L   Glucose, Bld 95  70 - 99 mg/dL   BUN 11  6 - 23 mg/dL   Creatinine, Ser 4.40  0.50 - 1.10 mg/dL   Calcium 8.8  8.4 - 10.2 mg/dL   Total Protein 5.5 (*) 6.0 - 8.3 g/dL   Albumin 2.3 (*) 3.5 - 5.2 g/dL   AST 20  0 - 37 U/L   ALT 18  0 - 35 U/L   Alkaline Phosphatase 70  39 - 117 U/L   Total Bilirubin 0.4  0.3 - 1.2 mg/dL   GFR calc non Af Amer >90  >90 mL/min   GFR calc Af Amer >90  >90 mL/min    Imaging Studies:  US Ob Follow Up  08/26/2013   FOLLOW UP SONOGRAM   Julia Johnson is in the office for a follow up sonogram for S>D.  She is a 33 y.o. year old G20P1011 with Estimated Date of Delivery: 09/21/13  now at  [redacted]w[redacted]d weeks gestation.   GESTATION: SINGLETON  PRESENTATION: cephalic  FETAL ACTIVITY:          Heart rate         157 bpm          The fetus is active.  AMNIOTIC FLUID: The amniotic fluid volume is  normal, (upper Normal limits) 20.3 cm  SDP-9.0cm.  PLACENTA LOCALIZATION:  anterior GRADE 2  CERVIX: Unable to visualize  ADNEXA: The adnexa apepars normal.   GESTATIONAL AGE AND  BIOMETRICS:  Gestational criteria: Estimated Date of Delivery: 09/21/13  now at [redacted]w[redacted]d  Previous Scans:  GESTATIONAL SAC            mm          weeks  CROWN RUMP LENGTH            mm          weeks  NUCHAL TRANSLUCENCY  mm           BIPARIETAL DIAMETER           9.23 cm         37+3 weeks  HEAD CIRCUMFERENCE           34.34 cm         39+5 weeks  ABDOMINAL CIRCUMFERENCE           35.72 cm         39+4 weeks  FEMUR LENGTH           7.14 cm         36+4 weeks                                                           AVERAGE EGA(BY THIS SCAN):   38+2 weeks                                                 ESTIMATED FETAL WEIGHT:        3580  grams, 97th % ANATOMICAL SURVEY                                                                             COMMENTS  CEREBRAL VENTRICLES yes normal   CHOROID PLEXUS yes normal   CEREBELLUM yes normal   CISTERNA MAGNA yes normal   NUCHAL REGION yes normal   ORBITS no    NASAL BONE no    NOSE/LIP no    FACIAL PROFILE no    4 CHAMBERED HEART no    OUTFLOW TRACTS no    DIAPHRAGM yes normal   STOMACH yes normal   RENAL REGION yes normal   BLADDER yes normal   CORD INSERTION no    3 VESSEL CORD yes    SPINE no    ARMS/HANDS no    LEGS/FEET yes    GENITALIA yes normal female        SUSPECTED ABNORMALITIES:  no  QUALITY OF SCAN: satisfactory  TECHNICIAN COMMENTS:  U/S(36+2wks)-vtx active fetus, EFW 7 lb 14 oz (97th%tile), fluid (upper  normal limits) AFI-21.3cm SDP-9.0cm, FHR-157 bpm, female fetus, anterior  Gr 2 placenta   A copy of this report including all images has been saved and backed up to  a second source for retrieval if needed. All measures and details of the  anatomical scan, placentation, fluid volume and pelvic anatomy are  contained in that report.  Julia Johnson 08/26/2013 1:23 PM        Assessment: Julia Johnson is  33 y.o. G3P1011 at 5456w5d presents with Hypertension, Headache and Dizziness .  Plan:  1) gHTN - cbc, cmp and prot/cr ratio all checked and normal.  - DTRs normal.  - reassurance given  2) HA - resolved with fiorecet x1.  - reassurance given   3) FWB - cat I  tracing   F/u at [facility] on Monday for twice weekly testing and discussion of plan for GHTN. rx of fioricet prn given.    Vale Haven 6/7/20156:34 PM

## 2013-08-30 ENCOUNTER — Ambulatory Visit (INDEPENDENT_AMBULATORY_CARE_PROVIDER_SITE_OTHER): Payer: BC Managed Care – PPO | Admitting: Obstetrics & Gynecology

## 2013-08-30 ENCOUNTER — Encounter: Payer: Self-pay | Admitting: Obstetrics & Gynecology

## 2013-08-30 VITALS — BP 138/80 | Wt 282.0 lb

## 2013-08-30 DIAGNOSIS — Z1389 Encounter for screening for other disorder: Secondary | ICD-10-CM

## 2013-08-30 DIAGNOSIS — O09899 Supervision of other high risk pregnancies, unspecified trimester: Secondary | ICD-10-CM

## 2013-08-30 DIAGNOSIS — O139 Gestational [pregnancy-induced] hypertension without significant proteinuria, unspecified trimester: Secondary | ICD-10-CM

## 2013-08-30 DIAGNOSIS — Z3480 Encounter for supervision of other normal pregnancy, unspecified trimester: Secondary | ICD-10-CM

## 2013-08-30 DIAGNOSIS — Z331 Pregnant state, incidental: Secondary | ICD-10-CM

## 2013-08-30 LAB — POCT URINALYSIS DIPSTICK
Blood, UA: NEGATIVE
Glucose, UA: NEGATIVE
KETONES UA: NEGATIVE
Nitrite, UA: NEGATIVE
PROTEIN UA: NEGATIVE

## 2013-08-30 NOTE — Progress Notes (Signed)
High Risk Pregnancy Diagnosis(es):   Gestational hypertension  G3P1011 [redacted]w[redacted]d Estimated Date of Delivery: 09/21/13  Blood pressure 138/80, weight 282 lb (127.914 kg), last menstrual period 12/07/2012.  Urinalysis: Negative   HPI:   No headache or visual changes  BP weight and urine results all reviewed and noted. Patient reports good fetal movement, denies any bleeding and no rupture of membranes symptoms or regular contractions.  Fundal Height:  40  Fetal Heart rate:  145 Edema:  trace  Patient is without complaints. All questions were answered.  Assessment:  Gestational Hypertension, borderline  Medication(s) Plans:  No changes  Treatment Plan:  Continue twice weekly assessment  Follow up in Thyrsday for sonogram and  for OB appt

## 2013-08-31 LAB — GC/CHLAMYDIA PROBE AMP
CT Probe RNA: NEGATIVE
GC PROBE AMP APTIMA: NEGATIVE

## 2013-08-31 LAB — STREP B DNA PROBE: GBSP: NOT DETECTED

## 2013-09-02 ENCOUNTER — Ambulatory Visit (INDEPENDENT_AMBULATORY_CARE_PROVIDER_SITE_OTHER): Payer: BC Managed Care – PPO

## 2013-09-02 ENCOUNTER — Other Ambulatory Visit: Payer: Self-pay | Admitting: Obstetrics & Gynecology

## 2013-09-02 ENCOUNTER — Ambulatory Visit (INDEPENDENT_AMBULATORY_CARE_PROVIDER_SITE_OTHER): Payer: BC Managed Care – PPO | Admitting: Obstetrics & Gynecology

## 2013-09-02 ENCOUNTER — Encounter: Payer: Self-pay | Admitting: Obstetrics & Gynecology

## 2013-09-02 VITALS — BP 130/90 | Wt 287.0 lb

## 2013-09-02 DIAGNOSIS — Z1389 Encounter for screening for other disorder: Secondary | ICD-10-CM

## 2013-09-02 DIAGNOSIS — O139 Gestational [pregnancy-induced] hypertension without significant proteinuria, unspecified trimester: Secondary | ICD-10-CM

## 2013-09-02 DIAGNOSIS — Z331 Pregnant state, incidental: Secondary | ICD-10-CM

## 2013-09-02 LAB — POCT URINALYSIS DIPSTICK
GLUCOSE UA: NEGATIVE
Ketones, UA: NEGATIVE
Nitrite, UA: NEGATIVE
Protein, UA: NEGATIVE
RBC UA: NEGATIVE

## 2013-09-02 NOTE — Progress Notes (Signed)
U/S(37+2wks)-vtx active fetus, BPP 8/8, fluid WNL AFI-15.02cm SDP-8.3cm, FHR- 156 bpm, UA Doppler RI-0.73 & 0.62, female fetus, anterior Gr 2 placenta

## 2013-09-06 ENCOUNTER — Other Ambulatory Visit: Payer: Self-pay | Admitting: Obstetrics and Gynecology

## 2013-09-06 ENCOUNTER — Ambulatory Visit (INDEPENDENT_AMBULATORY_CARE_PROVIDER_SITE_OTHER): Payer: BC Managed Care – PPO | Admitting: Obstetrics and Gynecology

## 2013-09-06 VITALS — BP 118/76 | Wt 285.0 lb

## 2013-09-06 DIAGNOSIS — Z3480 Encounter for supervision of other normal pregnancy, unspecified trimester: Secondary | ICD-10-CM

## 2013-09-06 DIAGNOSIS — Z1389 Encounter for screening for other disorder: Secondary | ICD-10-CM

## 2013-09-06 DIAGNOSIS — O139 Gestational [pregnancy-induced] hypertension without significant proteinuria, unspecified trimester: Secondary | ICD-10-CM

## 2013-09-06 DIAGNOSIS — O10019 Pre-existing essential hypertension complicating pregnancy, unspecified trimester: Secondary | ICD-10-CM

## 2013-09-06 DIAGNOSIS — Z331 Pregnant state, incidental: Secondary | ICD-10-CM

## 2013-09-06 LAB — POCT URINALYSIS DIPSTICK
GLUCOSE UA: NEGATIVE
Ketones, UA: NEGATIVE
Leukocytes, UA: NEGATIVE
NITRITE UA: NEGATIVE
PROTEIN UA: NEGATIVE
RBC UA: NEGATIVE

## 2013-09-06 NOTE — Progress Notes (Signed)
High Risk Pregnancy Diagnosis(es):   ge  G3P1011 968w6d Estimated Date of Delivery: 09/21/13  Blood pressure 118/76, weight 285 lb (129.275 kg), last menstrual period 12/07/2012.  Urinalysis: Negative  HPI: Patient presents for NST today due to elevated blood pressure at last visit.    BP weight and urine results all reviewed and noted. Patient reports good fetal movement, denies any bleeding and no rupture of membranes symptoms or regular contractions.  Fundal Height:  42 Fetal Heart rate:  150 Edema:  minimal  Patient is without complaints. Denies headache scotoma.  All questions were answered. Plan is to repeat u/s for EFW this Thursday, BPP, check cervix. Consider IOL at 39 wk or earlier if HTN present.   Assessment:  1768w6d,   Hx borderline BP elevations last wk, now normal                       LGA infant.                      Hx "easy" del of 8lb 7 oz infant p iol at 39+ c prior pregnancy Plan   BPP, u/s efw 3 days.            Consider IOL at 39 wk or earlier prn elevated BP's  Medication(s) Plans:  As above  Treatment Plan:  As above  Follow up in 3 days for OB appt, weight check and BPP

## 2013-09-06 NOTE — Progress Notes (Signed)
Pt wants to discuss next step, wants to avoid a c-section but is worried about the size of the baby.

## 2013-09-09 ENCOUNTER — Encounter: Payer: Self-pay | Admitting: Advanced Practice Midwife

## 2013-09-09 ENCOUNTER — Other Ambulatory Visit: Payer: BC Managed Care – PPO | Admitting: Advanced Practice Midwife

## 2013-09-09 ENCOUNTER — Encounter (HOSPITAL_COMMUNITY): Payer: Self-pay | Admitting: *Deleted

## 2013-09-09 ENCOUNTER — Ambulatory Visit (INDEPENDENT_AMBULATORY_CARE_PROVIDER_SITE_OTHER): Payer: BC Managed Care – PPO | Admitting: Advanced Practice Midwife

## 2013-09-09 ENCOUNTER — Ambulatory Visit (INDEPENDENT_AMBULATORY_CARE_PROVIDER_SITE_OTHER): Payer: BC Managed Care – PPO

## 2013-09-09 ENCOUNTER — Telehealth (HOSPITAL_COMMUNITY): Payer: Self-pay | Admitting: *Deleted

## 2013-09-09 VITALS — BP 120/78 | Wt 287.0 lb

## 2013-09-09 DIAGNOSIS — Z331 Pregnant state, incidental: Secondary | ICD-10-CM

## 2013-09-09 DIAGNOSIS — O139 Gestational [pregnancy-induced] hypertension without significant proteinuria, unspecified trimester: Secondary | ICD-10-CM

## 2013-09-09 DIAGNOSIS — O10019 Pre-existing essential hypertension complicating pregnancy, unspecified trimester: Secondary | ICD-10-CM

## 2013-09-09 DIAGNOSIS — Z1389 Encounter for screening for other disorder: Secondary | ICD-10-CM

## 2013-09-09 DIAGNOSIS — O133 Gestational [pregnancy-induced] hypertension without significant proteinuria, third trimester: Secondary | ICD-10-CM | POA: Insufficient documentation

## 2013-09-09 DIAGNOSIS — O09899 Supervision of other high risk pregnancies, unspecified trimester: Secondary | ICD-10-CM

## 2013-09-09 LAB — POCT URINALYSIS DIPSTICK
GLUCOSE UA: NEGATIVE
Ketones, UA: NEGATIVE
LEUKOCYTES UA: NEGATIVE
NITRITE UA: NEGATIVE
Protein, UA: NEGATIVE
RBC UA: NEGATIVE

## 2013-09-09 NOTE — Progress Notes (Signed)
High Risk Pregnancy Diagnosis(es):   GHTN (could be CHTN, undiagnosed)  G3P1011 573w2d Estimated Date of Delivery: 09/21/13  Blood pressure 120/78, weight 287 lb (130.182 kg), last menstrual period 12/07/2012.  Urinalysis: Negative   HPI: Denies HA, Blurred vision, RUQ pain     BP weight and urine results all reviewed and noted. Patient reports good fetal movement, denies any bleeding and no rupture of membranes symptoms or regular contractions.  Fundal Height:  87% EFW today Fetal Heart rate:  155 Edema:  trace  Patient is without complaints. All questions were answered.  Assessment:  3173w2d,   GHTN vs CHTN, stable  Medication(s) Plans:  No meds  Treatment Plan:  IOL at 39 weeks:  6/23 @ 0730 (no evening appts available)  Follow up in Monday for OB appt, /NST

## 2013-09-09 NOTE — Progress Notes (Signed)
U/S(38+2wks)-vtx active fetus, EFw 8 lb 7 oz (87th%tile), fluid WNL AFI-13.6cm SDP-6.0cm, FHR-152 bpm, UA Doppler RI-0.54 & 0.57, BPP 8/8, anterior Gr 2 placenta

## 2013-09-09 NOTE — Telephone Encounter (Signed)
Preadmission screen  

## 2013-09-09 NOTE — Progress Notes (Signed)
Pt denies any problems or concerns at this time.  

## 2013-09-10 ENCOUNTER — Encounter (HOSPITAL_COMMUNITY): Payer: Self-pay | Admitting: *Deleted

## 2013-09-10 ENCOUNTER — Inpatient Hospital Stay (HOSPITAL_COMMUNITY)
Admission: AD | Admit: 2013-09-10 | Discharge: 2013-09-10 | Disposition: A | Discharge status: Home / Self Care | Payer: BC Managed Care – PPO | Source: Ambulatory Visit | Attending: Obstetrics & Gynecology | Admitting: Obstetrics & Gynecology

## 2013-09-10 DIAGNOSIS — O9989 Other specified diseases and conditions complicating pregnancy, childbirth and the puerperium: Principal | ICD-10-CM

## 2013-09-10 DIAGNOSIS — O99891 Other specified diseases and conditions complicating pregnancy: Secondary | ICD-10-CM | POA: Diagnosis not present

## 2013-09-10 DIAGNOSIS — O133 Gestational [pregnancy-induced] hypertension without significant proteinuria, third trimester: Secondary | ICD-10-CM

## 2013-09-10 DIAGNOSIS — O139 Gestational [pregnancy-induced] hypertension without significant proteinuria, unspecified trimester: Secondary | ICD-10-CM

## 2013-09-10 DIAGNOSIS — R51 Headache: Secondary | ICD-10-CM | POA: Insufficient documentation

## 2013-09-10 LAB — URINALYSIS, ROUTINE W REFLEX MICROSCOPIC
Bilirubin Urine: NEGATIVE
Glucose, UA: NEGATIVE mg/dL
HGB URINE DIPSTICK: NEGATIVE
Ketones, ur: NEGATIVE mg/dL
NITRITE: NEGATIVE
PROTEIN: NEGATIVE mg/dL
SPECIFIC GRAVITY, URINE: 1.02 (ref 1.005–1.030)
UROBILINOGEN UA: 0.2 mg/dL (ref 0.0–1.0)
pH: 6 (ref 5.0–8.0)

## 2013-09-10 LAB — URINE MICROSCOPIC-ADD ON

## 2013-09-10 MED ORDER — DIPHENHYDRAMINE HCL 50 MG/ML IJ SOLN
25.0000 mg | Freq: Once | INTRAMUSCULAR | Status: AC
  Administered 2013-09-10: 25 mg via INTRAVENOUS
  Filled 2013-09-10: qty 1

## 2013-09-10 MED ORDER — DEXAMETHASONE SODIUM PHOSPHATE 10 MG/ML IJ SOLN
10.0000 mg | Freq: Once | INTRAMUSCULAR | Status: AC
  Administered 2013-09-10: 10 mg via INTRAVENOUS
  Filled 2013-09-10: qty 1

## 2013-09-10 MED ORDER — LACTATED RINGERS IV BOLUS (SEPSIS)
1000.0000 mL | Freq: Once | INTRAVENOUS | Status: AC
  Administered 2013-09-10: 1000 mL via INTRAVENOUS

## 2013-09-10 MED ORDER — METOCLOPRAMIDE HCL 5 MG/ML IJ SOLN
10.0000 mg | Freq: Once | INTRAMUSCULAR | Status: AC
  Administered 2013-09-10: 10 mg via INTRAVENOUS
  Filled 2013-09-10: qty 2

## 2013-09-10 NOTE — MAU Note (Signed)
Pt was seen in the office 09/09/2013. Pt states she has a history of elevated BP in pregnancy

## 2013-09-10 NOTE — MAU Provider Note (Signed)
None     Chief Complaint:  Headache   Julia Johnson is  33 y.o. G3P1011 at 74w3dpresents complaining of Headache SHe took 502mAPAP around 1730 and still has a HA.   Obstetrical/Gynecological History: OB History   Grav Para Term Preterm Abortions TAB SAB Ect Mult Living   _0 She has a hx of preeclampsia, and met criteria for GHTN a few weeks ago, but has had intermittent very normal B/P's.  She has been undergoing twice weekly testing (normal results) and plans IOL at 39 weeks.   Past Medical History: Past Medical History  Diagnosis Date  . Carpal tunnel syndrome   . Depression   . ADHD (attention deficit hyperactivity disorder)   . Hyperlipidemia   . Obesity   . Anxiety   . UTI (urinary tract infection)   . Pregnancy induced hypertension     Past Surgical History: Past Surgical History  Procedure Laterality Date  . Mouth surgery    . Skin biopsy  2012    Family History: Family History  Problem Relation Age of Onset  . Cancer Mother     breast  . Diabetes Mother   . Factor V Leiden deficiency Mother   . Hypertension Father   . Diabetes Father   . Aneurysm Father   . Diabetes Maternal Grandmother   . Congestive Heart Failure Maternal Grandmother   . Celiac disease Daughter   . Diabetes Paternal Grandfather   . Congestive Heart Failure Paternal Grandfather     Social History: History  Substance Use Topics  . Smoking status: Never Smoker   . Smokeless tobacco: Never Used  . Alcohol Use: No    Allergies: No Known Allergies  Meds:  No prescriptions prior to admission    Review of Systems   Constitutional: Negative for fever and chills Eyes: Negative for visual disturbances Respiratory: Negative for shortness of breath, dyspnea Cardiovascular: Negative for chest pain or palpitations  Gastrointestinal: Negative for vomiting, diarrhea and constipation Genitourinary: Negative for dysuria and urgency Musculoskeletal: Negative for  back pain, joint pain, myalgias  Neurological: Negative for dizziness         Physical Exam  Blood pressure 129/76, pulse 99, temperature 98.3 F (36.8 C), temperature source Oral, resp. rate 20, height 5' 6.5" (1.689 m), weight 131.543 kg (290 lb), last menstrual period 12/07/2012, SpO2 100.00%. GENERAL: Well-developed, well-nourished female in no acute distress.  LUNGS: Clear to auscultation bilaterally.  HEART: Regular rate and rhythm. ABDOMEN: Soft, nontender, nondistended, gravid.  EXTREMITIES: Nontender, no edema, 2+ distal pulses. DTR's 2+ CERVICAL EXAM: deferred FHT:  Baseline rate 140 bpm   Variability moderate  Accelerations present   Decelerations none Contractions: Every 0 mins   Labs: Results for orders placed in visit on 09/13/13 (from the past 24 hour(s))  POCT URINALYSIS DIPSTICK   Collection Time    09/13/13  2:40 PM      Result Value Ref Range   Color, UA       Clarity, UA       Glucose, UA neg     Bilirubin, UA       Ketones, UA neg     Spec Grav, UA       Blood, UA neg     pH, UA       Protein, UA neg     Urobilinogen, UA       Nitrite, UA neg  Leukocytes, UA Negative       Assessment: Julia Johnson is  33 y.o. G3P1011 at 34w3dpresents with HA relieved.  Plan: Plan IOL Tuesday as scheduled unless blood pressure increase or develops preeclampsia  Johnson,Julia Glynn 6/22/20155:36 PM ]

## 2013-09-10 NOTE — MAU Note (Signed)
Pt reports headache x about 7 hours, took tylenol with no relief then later took a Fioricet which did not help at all. Has been monitored for gest hypertension.

## 2013-09-13 ENCOUNTER — Ambulatory Visit (INDEPENDENT_AMBULATORY_CARE_PROVIDER_SITE_OTHER): Payer: BC Managed Care – PPO | Admitting: Obstetrics and Gynecology

## 2013-09-13 VITALS — BP 124/80 | Wt 290.0 lb

## 2013-09-13 DIAGNOSIS — O139 Gestational [pregnancy-induced] hypertension without significant proteinuria, unspecified trimester: Secondary | ICD-10-CM

## 2013-09-13 DIAGNOSIS — O09899 Supervision of other high risk pregnancies, unspecified trimester: Secondary | ICD-10-CM

## 2013-09-13 DIAGNOSIS — Z331 Pregnant state, incidental: Secondary | ICD-10-CM

## 2013-09-13 DIAGNOSIS — O99340 Other mental disorders complicating pregnancy, unspecified trimester: Secondary | ICD-10-CM

## 2013-09-13 DIAGNOSIS — O133 Gestational [pregnancy-induced] hypertension without significant proteinuria, third trimester: Secondary | ICD-10-CM

## 2013-09-13 DIAGNOSIS — IMO0002 Reserved for concepts with insufficient information to code with codable children: Secondary | ICD-10-CM

## 2013-09-13 DIAGNOSIS — Z1389 Encounter for screening for other disorder: Secondary | ICD-10-CM

## 2013-09-13 LAB — POCT URINALYSIS DIPSTICK
Blood, UA: NEGATIVE
Glucose, UA: NEGATIVE
Ketones, UA: NEGATIVE
LEUKOCYTES UA: NEGATIVE
Nitrite, UA: NEGATIVE
PROTEIN UA: NEGATIVE

## 2013-09-13 NOTE — Progress Notes (Signed)
High Risk Pregnancy Diagnosis(es):   GHTN   G3P1011 4359w6d Estimated Date of Delivery: 09/21/13  Blood pressure 124/80, weight 290 lb (131.543 kg), last menstrual period 12/07/2012.  Urinalysis: Negative for protein  HPI: Paulino Doorrin Muldrow is a 33 y.o. female presents today for NST, as final visit before IOL for Gest HTN   BP weight and urine results all reviewed and noted. Patient reports good fetal movement, denies any bleeding and no rupture of membranes symptoms or regular contractions.  Fundal Height:  43 cm, had u/s last Thursday, EFW 8.7 lb on 6/17 Fetal Heart rate:  150 bpm Edema:  trace Cx : 1-2cm/long /posterior /vertex/ -2  A   GEstational Hypertension P   Admit for IOL in AM. May require cervical ripening.  Patient is without complaints. All questions were answered.  Assessment:  8159w6d,   GHTN  Medication(s) Plans:  No meds  Treatment Plan:  IOL at 39 weeks: 6/23 @ 0730 (no evening appts available)

## 2013-09-13 NOTE — Progress Notes (Signed)
Pt states that back spasms and irregular contractions have increased. Voiding a lot more as well.

## 2013-09-14 ENCOUNTER — Inpatient Hospital Stay (HOSPITAL_COMMUNITY)
Admission: RE | Admit: 2013-09-14 | Discharge: 2013-09-16 | DRG: 775 | Disposition: A | Discharge status: Home / Self Care | Payer: BC Managed Care – PPO | Source: Ambulatory Visit | Attending: Obstetrics and Gynecology | Admitting: Obstetrics and Gynecology

## 2013-09-14 DIAGNOSIS — O99214 Obesity complicating childbirth: Secondary | ICD-10-CM

## 2013-09-14 DIAGNOSIS — E669 Obesity, unspecified: Secondary | ICD-10-CM | POA: Diagnosis present

## 2013-09-14 DIAGNOSIS — Z6841 Body Mass Index (BMI) 40.0 and over, adult: Secondary | ICD-10-CM

## 2013-09-14 DIAGNOSIS — Z8249 Family history of ischemic heart disease and other diseases of the circulatory system: Secondary | ICD-10-CM

## 2013-09-14 DIAGNOSIS — Z833 Family history of diabetes mellitus: Secondary | ICD-10-CM | POA: Diagnosis not present

## 2013-09-14 DIAGNOSIS — O139 Gestational [pregnancy-induced] hypertension without significant proteinuria, unspecified trimester: Secondary | ICD-10-CM | POA: Diagnosis present

## 2013-09-14 DIAGNOSIS — O479 False labor, unspecified: Secondary | ICD-10-CM | POA: Diagnosis present

## 2013-09-14 DIAGNOSIS — Z3490 Encounter for supervision of normal pregnancy, unspecified, unspecified trimester: Secondary | ICD-10-CM

## 2013-09-14 LAB — COMPREHENSIVE METABOLIC PANEL
ALBUMIN: 2.4 g/dL — AB (ref 3.5–5.2)
ALK PHOS: 85 U/L (ref 39–117)
ALT: 21 U/L (ref 0–35)
AST: 20 U/L (ref 0–37)
BILIRUBIN TOTAL: 0.4 mg/dL (ref 0.3–1.2)
BUN: 12 mg/dL (ref 6–23)
CO2: 20 mEq/L (ref 19–32)
Calcium: 8.8 mg/dL (ref 8.4–10.5)
Chloride: 102 mEq/L (ref 96–112)
Creatinine, Ser: 0.72 mg/dL (ref 0.50–1.10)
GFR calc Af Amer: >90 mL/min (ref >90)
GFR calc non Af Amer: >90 mL/min (ref >90)
Glucose, Bld: 158 mg/dL — ABNORMAL HIGH (ref 70–99)
Potassium: 4.3 mEq/L (ref 3.7–5.3)
Sodium: 138 mEq/L (ref 137–147)
TOTAL PROTEIN: 5.6 g/dL — AB (ref 6.0–8.3)

## 2013-09-14 LAB — TYPE AND SCREEN
ABO/RH(D): B POS
Antibody Screen: NEGATIVE

## 2013-09-14 LAB — CBC
HCT: 35.9 % — ABNORMAL LOW (ref 36.0–46.0)
HEMOGLOBIN: 11.9 g/dL — AB (ref 12.0–15.0)
MCH: 29.1 pg (ref 26.0–34.0)
MCHC: 33.1 g/dL (ref 30.0–36.0)
MCV: 87.8 fL (ref 78.0–100.0)
Platelets: 169 10*3/uL (ref 150–400)
RBC: 4.09 MIL/uL (ref 3.87–5.11)
RDW: 16 % — AB (ref 11.5–15.5)
WBC: 12.5 10*3/uL — AB (ref 4.0–10.5)

## 2013-09-14 LAB — RPR

## 2013-09-14 MED ORDER — ACETAMINOPHEN 325 MG PO TABS: 650.0000 mg | ORAL_TABLET | ORAL | Status: DC | PRN

## 2013-09-14 MED ORDER — OXYTOCIN BOLUS FROM INFUSION: 500.0000 mL | INTRAVENOUS | Status: DC

## 2013-09-14 MED ORDER — OXYCODONE-ACETAMINOPHEN 5-325 MG PO TABS: 1.0000 | ORAL_TABLET | ORAL | Status: DC | PRN

## 2013-09-14 MED ORDER — FENTANYL 2.5 MCG/ML BUPIVACAINE 1/10 % EPIDURAL INFUSION (WH - ANES)
14.0000 mL/h | INTRAMUSCULAR | Status: DC | PRN
  Filled 2013-09-14: qty 125

## 2013-09-14 MED ORDER — CITRIC ACID-SODIUM CITRATE 334-500 MG/5ML PO SOLN: 30.0000 mL | ORAL | Status: DC | PRN

## 2013-09-14 MED ORDER — LIDOCAINE HCL (PF) 1 % IJ SOLN
30.0000 mL | INTRAMUSCULAR | Status: DC | PRN
  Administered 2013-09-14: 30 mL via SUBCUTANEOUS
  Filled 2013-09-14: qty 30

## 2013-09-14 MED ORDER — ONDANSETRON HCL 4 MG/2ML IJ SOLN
4.0000 mg | Freq: Four times a day (QID) | INTRAMUSCULAR | Status: DC | PRN
  Administered 2013-09-14: 4 mg via INTRAVENOUS
  Filled 2013-09-14: qty 2

## 2013-09-14 MED ORDER — FENTANYL CITRATE 0.05 MG/ML IJ SOLN
100.0000 ug | INTRAMUSCULAR | Status: DC | PRN
  Administered 2013-09-14 (×4): 100 ug via INTRAVENOUS
  Filled 2013-09-14 (×3): qty 2

## 2013-09-14 MED ORDER — MISOPROSTOL 25 MCG QUARTER TABLET: 25.0000 ug | ORAL_TABLET | ORAL | Status: DC | PRN

## 2013-09-14 MED ORDER — DIPHENHYDRAMINE HCL 50 MG/ML IJ SOLN: 12.5000 mg | INTRAMUSCULAR | Status: DC | PRN

## 2013-09-14 MED ORDER — IBUPROFEN 600 MG PO TABS
600.0000 mg | ORAL_TABLET | Freq: Four times a day (QID) | ORAL | Status: DC | PRN
  Administered 2013-09-15: 600 mg via ORAL
  Filled 2013-09-14: qty 1

## 2013-09-14 MED ORDER — EPHEDRINE 5 MG/ML INJ
10.0000 mg | INTRAVENOUS | Status: DC | PRN
  Filled 2013-09-14: qty 2

## 2013-09-14 MED ORDER — TERBUTALINE SULFATE 1 MG/ML IJ SOLN: 0.2500 mg | Freq: Once | INTRAMUSCULAR | Status: AC | PRN

## 2013-09-14 MED ORDER — LACTATED RINGERS IV SOLN
500.0000 mL | INTRAVENOUS | Status: DC | PRN
Start: 2013-09-14 — End: 2013-09-15

## 2013-09-14 MED ORDER — LACTATED RINGERS IV SOLN
INTRAVENOUS | Status: DC
  Administered 2013-09-14: 16:00:00 via INTRAVENOUS

## 2013-09-14 MED ORDER — FENTANYL CITRATE 0.05 MG/ML IJ SOLN
INTRAMUSCULAR | Status: AC
  Filled 2013-09-14: qty 2

## 2013-09-14 MED ORDER — PHENYLEPHRINE 40 MCG/ML (10ML) SYRINGE FOR IV PUSH (FOR BLOOD PRESSURE SUPPORT)
80.0000 ug | PREFILLED_SYRINGE | INTRAVENOUS | Status: DC | PRN
  Filled 2013-09-14: qty 10
  Filled 2013-09-14: qty 2

## 2013-09-14 MED ORDER — FLEET ENEMA 7-19 GM/118ML RE ENEM: 1.0000 | ENEMA | RECTAL | Status: DC | PRN

## 2013-09-14 MED ORDER — OXYTOCIN 40 UNITS IN LACTATED RINGERS INFUSION - SIMPLE MED
62.5000 mL/h | INTRAVENOUS | Status: DC
  Filled 2013-09-14: qty 1000

## 2013-09-14 MED ORDER — MISOPROSTOL 200 MCG PO TABS
50.0000 ug | ORAL_TABLET | ORAL | Status: DC | PRN
  Administered 2013-09-14 (×2): 50 ug via ORAL
  Filled 2013-09-14: qty 1

## 2013-09-14 MED ORDER — PHENYLEPHRINE 40 MCG/ML (10ML) SYRINGE FOR IV PUSH (FOR BLOOD PRESSURE SUPPORT)
80.0000 ug | PREFILLED_SYRINGE | INTRAVENOUS | Status: DC | PRN
  Filled 2013-09-14: qty 2

## 2013-09-14 MED ORDER — OXYTOCIN 40 UNITS IN LACTATED RINGERS INFUSION - SIMPLE MED
1.0000 m[IU]/min | INTRAVENOUS | Status: DC
  Administered 2013-09-14: 2 m[IU]/min via INTRAVENOUS
  Administered 2013-09-14: 666 m[IU]/min via INTRAVENOUS
  Filled 2013-09-14: qty 1000

## 2013-09-14 MED ORDER — LACTATED RINGERS IV SOLN: 500.0000 mL | Freq: Once | INTRAVENOUS | Status: DC

## 2013-09-14 MED ORDER — EPHEDRINE 5 MG/ML INJ
10.0000 mg | INTRAVENOUS | Status: DC | PRN
  Filled 2013-09-14: qty 4
  Filled 2013-09-14: qty 2

## 2013-09-14 NOTE — Progress Notes (Signed)
Julia Johnson is a 33 y.o. G3P1011 at 3326w0d admitted for induction of labor due to Hypertension.  Subjective:  Doing well. Starting to feel the contractions slightly more.  +FM.    Objective: BP 118/75  Pulse 90  Temp(Src) 98.1 F (36.7 C) (Oral)  Resp 16  Ht 5' 6.5" (1.689 m)  Wt 131.543 kg (290 lb)  BMI 46.11 kg/m2  LMP 12/07/2012      FHT:  FHR: 140 bpm, variability: moderate,  accelerations:  Present,  decelerations:  Absent UC:   irregular, every 2-6 minutes SVE:   Dilation: Fingertip Effacement (%): 50 Station: -3 Exam by:: Paul Dykesina Feir RN  Labs: Lab Results  Component Value Date   WBC 12.5* 09/14/2013   HGB 11.9* 09/14/2013   HCT 35.9* 09/14/2013   MCV 87.8 09/14/2013   PLT 169 09/14/2013    Assessment / Plan: IOL for gHTN  Labor: cont cytotec. will re-eval after 2nd dose Fetal Wellbeing:  Category I Pain Control:  Labor support without medications I/D:  n/a Anticipated MOD:  NSVD and EFW 8lb7oz on 6/17  BECK, KELI L 09/14/2013, 4:04 PM

## 2013-09-14 NOTE — Progress Notes (Signed)
Abdominal binder removed

## 2013-09-14 NOTE — H&P (Signed)
LABOR ADMISSION HISTORY AND PHYSICAL  Julia Johnson is a 33 y.o. female G3P1011 with IUP at 663w0d presenting for IOL for gestational HTN. She denies HA, scotoma, RUQ pain and increased leg swelling.    Blood pressure have been increasing (140/80) but no preeclampsia.   She has a history of depression/anxiety but is not currently being treated and denies any symptoms.   PNCare at [facility] since 11 wks.   Prenatal History/Complications:  Past Medical History: Past Medical History  Diagnosis Date  . Carpal tunnel syndrome   . Depression   . ADHD (attention deficit hyperactivity disorder)   . Hyperlipidemia   . Obesity   . Anxiety   . UTI (urinary tract infection)   . Pregnancy induced hypertension     Past Surgical History: Past Surgical History  Procedure Laterality Date  . Mouth surgery    . Skin biopsy  2012    Obstetrical History: OB History   Grav Para Term Preterm Abortions TAB SAB Ect Mult Living   3 1 1  1  1   1      Largest baby: 8 lbs 7 oz   Social History: History   Social History  . Marital Status: Married    Spouse Name: N/A    Number of Children: N/A  . Years of Education: N/A   Social History Main Topics  . Smoking status: Never Smoker   . Smokeless tobacco: Never Used  . Alcohol Use: No  . Drug Use: No  . Sexual Activity: Yes    Birth Control/ Protection: None   Other Topics Concern  . Not on file   Social History Narrative  . No narrative on file    Family History: Family History  Problem Relation Age of Onset  . Cancer Mother     breast  . Diabetes Mother   . Factor V Leiden deficiency Mother   . Hypertension Father   . Diabetes Father   . Aneurysm Father   . Diabetes Maternal Grandmother   . Congestive Heart Failure Maternal Grandmother   . Celiac disease Daughter   . Diabetes Paternal Grandfather   . Congestive Heart Failure Paternal Grandfather     Allergies: No Known Allergies  Prescriptions prior to admission   Medication Sig Dispense Refill  . acetaminophen (TYLENOL) 500 MG tablet Take 500 mg by mouth every 4 (four) hours as needed for moderate pain or headache.       . Butalbital-APAP-Caffeine 50-300-40 MG CAPS Take 1 tablet by mouth 3 (three) times daily as needed.  10 capsule  0  . cetirizine (ZYRTEC) 10 MG tablet Take 10 mg by mouth daily as needed for allergies.      . Prenatal Vit-Fe Fumarate-FA (PNV PRENATAL PLUS MULTIVITAMIN) 27-1 MG TABS Take 1 tablet by mouth once.  30 tablet  11     Review of Systems   All systems reviewed and negative except as stated in HPI  Blood pressure 125/63, pulse 109, temperature 98 F (36.7 C), temperature source Oral, last menstrual period 12/07/2012. General appearance: alert, cooperative and no distress Lungs: clear to auscultation bilaterally Heart: regular rate and rhythm Abdomen: soft, non-tender; bowel sounds normal Extremities: Homans sign is negative, no sign of DVT Presentation: cephalic Fetal monitoringBaseline: 155 bpm, Variability: Good {> 6 bpm), Accelerations: Reactive and Decelerations: Absent Uterine activity: irregular     Prenatal labs: ABO, Rh: B/POS/-- (12/02 1521) Antibody: NEG (04/01 0909) Rubella:   RPR: NON REAC (04/01 0909)  HBsAg: NEGATIVE (  12/02 1521)  HIV: NON REACTIVE (04/01 0909)  GBS: NOT DETECTED (06/08 1543)  1 hr Glucola 2 hr   92/147/121 Genetic screening  Declined  Anatomy US normal   Prenatal Transfer Tool  Maternal Diabetes: No Genetic Screening: Declined Maternal Ultrasounds/Referrals: Normal Fetal Ultrasounds or other Referrals:  None Maternal Substance Abuse:  No Significant Maternal Medications:  None Significant Maternal Lab Results: Lab values include: Group B Strep negative  [facility]  [place], [place], 2nd baby  Pap 01/14/13: neg w/ -HRHPV  GC/CT Initial:   -/-             36+wks:  Genetic Screen NT/IT: declined  CF screen neg  Anatomic US Normal female 'Julia ConesLydia  Johnson'  Flu vaccine Declined 02/23/13, received 04/27/13  Glucose Screen  2 hr   92/147/121  GBS   Feed Preference breast  Contraception condoms  Circumcision Yes, if boy  Childbirth Classes Interested, info given, scheduled for May  Pediatrician Teressa SenterBoyd, [facility]      Results for orders placed in visit on 09/13/13 (from the past 24 hour(s))  POCT URINALYSIS DIPSTICK   Collection Time    09/13/13  2:40 PM      Result Value Ref Range   Color, UA       Clarity, UA       Glucose, UA neg     Bilirubin, UA       Ketones, UA neg     Spec Grav, UA       Blood, UA neg     pH, UA       Protein, UA neg     Urobilinogen, UA       Nitrite, UA neg     Leukocytes, UA Negative      Assessment: Julia Johnson is a 33 y.o. G3P1011 at 764w0d here for IOL for gHTN.   GHTN: Blood pressure within normal ranges currently  - continue to monitor.   #Labor: cytotec then FB  #Pain: IV pain meds #FWB: Cat 1  #ID: GBS neg  #MOF: breast  #MOC:condoms, family planning  #Circ:  Female   Myra RudeSchmitz, Jeremy E 09/14/2013, 8:41 AM   I have seen and examined this patient and agree with above documentation in the resident's note.  Pt also noted by resident to be breech on [place] at time of admission but upon my exam was vertex with a story from Mom taht large movement had been felt. Will place an abdominal binder.  Also will plan to repeat r/o labs due to hx of gHTN.  FWB- cat I   Rulon AbideKeli Beck, M.D. [facility] 09/14/2013 2:24 PM

## 2013-09-15 ENCOUNTER — Encounter (HOSPITAL_COMMUNITY): Payer: Self-pay

## 2013-09-15 DIAGNOSIS — O139 Gestational [pregnancy-induced] hypertension without significant proteinuria, unspecified trimester: Secondary | ICD-10-CM | POA: Diagnosis not present

## 2013-09-15 DIAGNOSIS — Z3490 Encounter for supervision of normal pregnancy, unspecified, unspecified trimester: Secondary | ICD-10-CM

## 2013-09-15 DIAGNOSIS — O479 False labor, unspecified: Secondary | ICD-10-CM | POA: Diagnosis not present

## 2013-09-15 LAB — ABO/RH: ABO/RH(D): B POS

## 2013-09-15 MED ORDER — LANOLIN HYDROUS EX OINT
TOPICAL_OINTMENT | CUTANEOUS | Status: DC | PRN
Start: 2013-09-15 — End: 2013-09-16

## 2013-09-15 MED ORDER — ZOLPIDEM TARTRATE 5 MG PO TABS: 5.0000 mg | ORAL_TABLET | Freq: Every evening | ORAL | Status: DC | PRN

## 2013-09-15 MED ORDER — OXYCODONE-ACETAMINOPHEN 5-325 MG PO TABS: 1.0000 | ORAL_TABLET | ORAL | Status: DC | PRN

## 2013-09-15 MED ORDER — ONDANSETRON HCL 4 MG PO TABS: 4.0000 mg | ORAL_TABLET | ORAL | Status: DC | PRN

## 2013-09-15 MED ORDER — WITCH HAZEL-GLYCERIN EX PADS: 1.0000 "application " | MEDICATED_PAD | CUTANEOUS | Status: DC | PRN

## 2013-09-15 MED ORDER — ONDANSETRON HCL 4 MG/2ML IJ SOLN: 4.0000 mg | INTRAMUSCULAR | Status: DC | PRN

## 2013-09-15 MED ORDER — SIMETHICONE 80 MG PO CHEW: 80.0000 mg | CHEWABLE_TABLET | ORAL | Status: DC | PRN

## 2013-09-15 MED ORDER — DIPHENHYDRAMINE HCL 25 MG PO CAPS: 25.0000 mg | ORAL_CAPSULE | Freq: Four times a day (QID) | ORAL | Status: DC | PRN

## 2013-09-15 MED ORDER — PRENATAL MULTIVITAMIN CH
1.0000 | ORAL_TABLET | Freq: Every day | ORAL | Status: DC
  Administered 2013-09-15 – 2013-09-16 (×2): 1 via ORAL
  Filled 2013-09-15 (×2): qty 1

## 2013-09-15 MED ORDER — TETANUS-DIPHTH-ACELL PERTUSSIS 5-2.5-18.5 LF-MCG/0.5 IM SUSP
0.5000 mL | Freq: Once | INTRAMUSCULAR | Status: AC
  Administered 2013-09-16: 0.5 mL via INTRAMUSCULAR
  Filled 2013-09-15 (×2): qty 0.5

## 2013-09-15 MED ORDER — DIBUCAINE 1 % RE OINT: 1.0000 "application " | TOPICAL_OINTMENT | RECTAL | Status: DC | PRN

## 2013-09-15 MED ORDER — IBUPROFEN 600 MG PO TABS
600.0000 mg | ORAL_TABLET | Freq: Four times a day (QID) | ORAL | Status: DC
  Administered 2013-09-15 – 2013-09-16 (×6): 600 mg via ORAL
  Filled 2013-09-15 (×6): qty 1

## 2013-09-15 MED ORDER — BENZOCAINE-MENTHOL 20-0.5 % EX AERO
1.0000 "application " | INHALATION_SPRAY | CUTANEOUS | Status: DC | PRN
  Administered 2013-09-15: 1 via TOPICAL
  Filled 2013-09-15: qty 56

## 2013-09-15 MED ORDER — SENNOSIDES-DOCUSATE SODIUM 8.6-50 MG PO TABS
2.0000 | ORAL_TABLET | ORAL | Status: DC
  Administered 2013-09-16: 2 via ORAL
  Filled 2013-09-15: qty 2

## 2013-09-15 MED ORDER — MEASLES, MUMPS & RUBELLA VAC ~~LOC~~ INJ
0.5000 mL | INJECTION | Freq: Once | SUBCUTANEOUS | Status: DC
  Filled 2013-09-15: qty 0.5

## 2013-09-15 NOTE — H&P (Signed)
Attestation of Attending Supervision of Advanced Practitioner (CNM/NP): Evaluation and management procedures were performed by the Advanced Practitioner under my supervision and collaboration.  I have reviewed the Advanced Practitioner's note and chart, and I agree with the management and plan.  CONSTANT,PEGGY 09/15/2013 11:07 AM   

## 2013-09-15 NOTE — Progress Notes (Signed)
Post Partum Day 1 Subjective: no complaints, voiding and tolerating PO In ICU only due to space needed for two hospital beds in one room for her husband in w/c  Objective: Blood pressure 137/89, pulse 116, temperature 98 F (36.7 C), temperature source Oral, resp. rate 20, height 5' 6.5" (1.689 m), weight 131.543 kg (290 lb), last menstrual period 12/07/2012, SpO2 99.00%, unknown if currently breastfeeding.  Physical Exam:  General: alert, cooperative and no distress Lochia: appropriate Uterine Fundus: firm Incision: na DVT Evaluation: No evidence of DVT seen on physical exam. No cords or calf tenderness. No significant calf/ankle edema.   Recent Labs  09/14/13 0810  HGB 11.9*  HCT 35.9*    Assessment/Plan: Plan for discharge tomorrow, Breastfeeding and Contraception condoms   LOS: 1 day   BECK, KELI L 09/15/2013, 9:03 AM

## 2013-09-15 NOTE — Lactation Note (Signed)
This note was copied from the chart of Julia Paulino Doorrin Gillham. Lactation Consultation Note     Initial consult with this mom and baby, now 4411 hours old, and having low blood sugars. Mom has been breast feeding, for 5-8 minutes. I reviewed Baby and Me book pages on breast feeding. I showed mom how to hand express. She has easily expressed colostrum, and the baby was cup fed about 0.4 mls of colostrum, and then I assisted mom with latching baby in cross cradle(as opposed to cradle), and the baby latched deeply and rhythmically for 14 minutes. I called Heather, CNS RN, and informed her of this . DEP in room, but not set up since baby is breast feeding well. Mom aware we may start her pumping, if baby's blood sugars remain low, to supplement with EBm as opposed to formula. Pregestiml formula in the baby's cris, since her 33 year old sister has celiac disease. Mom knows to call for questions/concerns. Lactatiin services reviewed with mom.   Patient Name: Julia Johnson Reason for consult: Initial assessment   Maternal Data Formula Feeding for Exclusion: Yes Reason for exclusion: Admission to Intensive Care Unit (ICU) post-partum Infant to breast within first hour of birth: Yes Has patient been taught Hand Expression?: Yes Does the patient have breastfeeding experience prior to this delivery?: Yes  Feeding Feeding Type: Breast Milk Length of feed: 14 min  LATCH Score/Interventions Latch: Grasps breast easily, tongue down, lips flanged, rhythmical sucking. Intervention(s): Adjust position;Assist with latch;Breast compression  Audible Swallowing: A few with stimulation Intervention(s): Skin to skin;Hand expression  Type of Nipple: Everted at rest and after stimulation (soft, semi flat, baby latched well )  Comfort (Breast/Nipple): Soft / non-tender     Hold (Positioning): Assistance needed to correctly position infant at breast and maintain latch. (mom was using cradle  - showed her cc for deeper latch) Intervention(s): Breastfeeding basics reviewed;Support Pillows;Position options;Skin to skin  LATCH Score: 8  Lactation Tools Discussed/Used Tools: Pump Initiated by:: c lee rn lc   Consult Status Consult Status: Follow-up Date: 09/16/13 Follow-up type: In-patient    Alfred LevinsLee, [name], 11:44 AM

## 2013-09-15 NOTE — Progress Notes (Signed)
UR chart review completed.  

## 2013-09-16 ENCOUNTER — Encounter (HOSPITAL_COMMUNITY): Payer: Self-pay

## 2013-09-16 MED ORDER — IBUPROFEN 600 MG PO TABS: 600.0000 mg | ORAL_TABLET | Freq: Four times a day (QID) | ORAL | Status: DC

## 2013-09-16 NOTE — Lactation Note (Signed)
This note was copied from the chart of Julia Paulino Doorrin Rolston. Lactation Consultation Note     Follow up consult with this mom and term baby, now 34 hours post partum. The baby was still  Having some intermittentt problems with hypoglycemia, and now hyperbilirubinemia. The baby is on double phototherapy, with last bili  being 10.4 at midnight. Mom is pumping every 3 hours, feeding by syringe about 5 mls of EBM(whatever she expresses) and then formula feeding up to 20 mls. I showed MGM how to clean and reassemble pump parts. I told mom that she can bottle feed EBM first, and then add formula to the bottle, and bottle feed formula. She does not need to syringe feed, since we need to bottle feed formula anyway. Mom agreed to this.Mom knows to call for questions/concerns.  Patient Name: Julia Johnson Reason for consult: Follow-up assessment   Maternal Data    Feeding    LATCH Score/Interventions                      Lactation Tools Discussed/Used Pump Review: Setup, frequency, and cleaning   Consult Status Consult Status: Follow-up Date: 09/17/13 Follow-up type: In-patient    Alfred LevinsLee, [name], 10:08 AM

## 2013-09-16 NOTE — Discharge Summary (Signed)
Obstetric Discharge Summary Reason for Admission: induction of labor for gHTN Prenatal Procedures: none Intrapartum Procedures: spontaneous vaginal delivery Postpartum Procedures: none Complications-Operative and Postpartum: 2nd degree perineal laceration Hemoglobin  Date Value Ref Range Status  09/14/2013 11.9* 12.0 - 15.0 g/dL Final     HCT  Date Value Ref Range Status  09/14/2013 35.9* 36.0 - 46.0 % Final   Ms Randie HeinzBroadway is a 33yo G3P1011 admitted on the morning of 09/14/13 @ 39.0wks for IOL due to gHTN. Her BPs have been rising, but pt was asymptomatic. Magnesium therapy was not required during her stay. Cx ripening was accomplished with cytotec and FB followed by Pitocin, and delivery was achieved shortly before MN on the same day. By PPD#2 the pt was doing well and was deemed to have received the full benefit of her hospital stay. BPs remained manageable PP without medication. The infant was being followed closely for hypoglycemia and elevated bili. She was breastfeeding and plans NFP.  Physical Exam:  General: alert, cooperative and no distress Heart: RRR Lungs: nl effort Lochia: appropriate Uterine Fundus: firm DVT Evaluation: No evidence of DVT seen on physical exam.  Discharge Diagnoses: Term Pregnancy-delivered  Discharge Information: Date: 09/16/2013 Activity: pelvic rest Diet: routine Medications: PNV and Ibuprofen Condition: stable Instructions: refer to practice specific booklet Discharge to: home Follow-up Information   Follow up with [facility]. (Keep next scheduled appointment for your postpartum appointment.)    Contact information:   [address] [place] [id number] [phone]      Newborn Data: Live born female  Birth Weight: 9 lb 11.7 oz (4415 g) APGAR: 8, 9  Home with mother.  Cam HaiSHAW, KIMBERLY CNM 09/16/2013, 8:13 AM

## 2013-09-16 NOTE — Progress Notes (Signed)
09/16/13 1600  Clinical Encounter Type  Visited With Patient and family together;Health care provider (consulted with RN, CSW)  Visit Type Spiritual support;Social support  Referral From Nurse;Chaplain  Spiritual Encounters  Spiritual Needs Emotional;Prayer  Stress Factors  Patient Stress Factors Family relationships (loss of transport (access issue/husband has cerebral palsy))  Family Stress Factors Family relationships (loss of transport (access issue/husband has cerebral palsy))   Made lengthy (>1 hour) visit with Larkin InaErin, Scott, older daughter Ardelle ParkRachel, Sarie's stepmother, and baby Isabelle CourseLydia.  Primarily served as witness to family's stories of stressors and motivation, providing reflective listening, blessing for baby, prayer for family.  In addition to family relationships, a huge stressor is lack of vehicle suitable for transporting Scott with his power wheelchair; per family, [place] for [place] passengers to medical appointments.  This lack of mobility creates additional expenses (per family, "$45 to transport Scott to [employer] dance [place] a mile from home") and severely limits social engagement, errand running, and other elements of daily quality of life.  They have been unable to reach a church, as well--a big loss, they report, as Lorin PicketScott is [employer]. Consulted RN and CSW re support/resources.  Family voiced great appreciation for pastoral presence, listening, and prayer.   [address] LaneChaplain Enrigue Hashimi ParkerLunde[place], [place] [phone]

## 2013-09-16 NOTE — Discharge Instructions (Signed)
Vaginal Delivery °Care After °Refer to this sheet in the next few weeks. These discharge instructions provide you with information on caring for yourself after delivery. Your caregiver may also give you specific instructions. Your treatment has been planned according to the most current medical practices available, but problems sometimes occur. Call your caregiver if you have any problems or questions after you go home. °HOME CARE INSTRUCTIONS °· Take over-the-counter or prescription medicines only as directed by your caregiver or pharmacist. °· Do not drink alcohol, especially if you are breastfeeding or taking medicine to relieve pain. °· Do not chew or smoke tobacco. °· Do not use illegal drugs. °· Continue to use good perineal care. Good perineal care includes: °¨ Wiping your perineum from front to back. °¨ Keeping your perineum clean. °· Do not use tampons or douche until your caregiver says it is okay. °· Shower, wash your hair, and take tub baths as directed by your caregiver. °· Wear a well-fitting bra that provides breast support. °· Eat healthy foods. °· Drink enough fluids to keep your urine clear or pale yellow. °· Eat high-fiber foods such as whole grain cereals and breads, brown rice, beans, and fresh fruits and vegetables every day. These foods may help prevent or relieve constipation. °· Follow your cargiver's recommendations regarding resumption of activities such as climbing stairs, driving, lifting, exercising, or traveling. °· Talk to your caregiver about resuming sexual activities. Resumption of sexual activities is dependent upon your risk of infection, your rate of healing, and your comfort and desire to resume sexual activity. °· Try to have someone help you with your household activities and your newborn for at least a few days after you leave the hospital. °· Rest as much as possible. Try to rest or take a nap when your newborn is sleeping. °· Increase your activities gradually. °· Keep all  of your scheduled postpartum appointments. It is very important to keep your scheduled follow-up appointments. At these appointments, your caregiver will be checking to make sure that you are healing physically and emotionally. °SEEK MEDICAL CARE IF:  °· You are passing large clots from your vagina. Save any clots to show your caregiver. °· You have a foul smelling discharge from your vagina. °· You have trouble urinating. °· You are urinating frequently. °· You have pain when you urinate. °· You have a change in your bowel movements. °· You have increasing redness, pain, or swelling near your vaginal incision (episiotomy) or vaginal tear. °· You have pus draining from your episiotomy or vaginal tear. °· Your episiotomy or vaginal tear is separating. °· You have painful, hard, or reddened breasts. °· You have a severe headache. °· You have blurred vision or see spots. °· You feel sad or depressed. °· You have thoughts of hurting yourself or your newborn. °· You have questions about your care, the care of your newborn, or medicines. °· You are dizzy or lightheaded. °· You have a rash. °· You have nausea or vomiting. °· You were breastfeeding and have not had a menstrual period within 12 weeks after you stopped breastfeeding. °· You are not breastfeeding and have not had a menstrual period by the 12th week after delivery. °· You have a fever. °SEEK IMMEDIATE MEDICAL CARE IF:  °· You have persistent pain. °· You have chest pain. °· You have shortness of breath. °· You faint. °· You have leg pain. °· You have stomach pain. °· Your vaginal bleeding saturates two or more sanitary pads   in 1 hour. °MAKE SURE YOU:  °· Understand these instructions. °· Will watch your condition. °· Will get help right away if you are not doing well or get worse. ° ° °Document Released: 03/08/2000 Document Revised: 12/04/2011 Document Reviewed: 11/06/2011 °ExitCare® Patient Information ©2015 [employer]. This information is not intended to  replace advice given to you by your health care provider. Make sure you discuss any questions you have with your health care provider. ° °

## 2013-09-16 NOTE — Progress Notes (Signed)
Patient and family  are seen awake in room, no concern voiced. We will continue to monitor.

## 2013-09-17 ENCOUNTER — Ambulatory Visit: Payer: Self-pay

## 2013-09-17 NOTE — Lactation Note (Signed)
This note was copied from the chart of Julia Johnson [place]. Lactation Consultation Note     Follow up consult with this mom and baby, going home today. They are 60 hours post partum, and baby is gterm. Her bili level is dropping, and phototherapy has been discontinued. I had mom pump with DEP to see how her milk supply was doing. In about 5 minutes, she expressed 8 mls, and was still dripping. I had mom stop at this time, and used the EBM in an SNS  Under a 24 nipple shiled, and assisted mom with latching baby. She finished the EBM and continued to suckle well at the breast for 10 minutes total. I stressed to mom to make sure baby is latched beyond her nipple, boht with and without nipple shield. I also told mom she need not use SNS each time, but it is an option for supplemtn ing EBM/formula at the breast. Mom is also aware to call alctation for questions/concerns and o/p consults prn.  Patient Name: Julia Johnson Rietz JXBJY'NToday's Date: 09/17/2013 Reason for consult: Follow-up assessment   Maternal Data    Feeding Feeding Type: Breast Milk Length of feed: 10 min  LATCH Score/Interventions Latch: Repeated attempts needed to sustain latch, nipple held in mouth throughout feeding, stimulation needed to elicit sucking reflex. (latched deeper with shiled and  breast compression) Intervention(s): Adjust position  Audible Swallowing: A few with stimulation  Type of Nipple: Everted at rest and after stimulation (suing shiled to transition back to breast)  Comfort (Breast/Nipple): Soft / non-tender     Hold (Positioning): Assistance needed to correctly position infant at breast and maintain latch. Intervention(s): Breastfeeding basics reviewed;Support Pillows;Position options;Skin to skin  LATCH Score: 7  Lactation Tools Discussed/Used Tools: Pump Breast pump type: Double-Electric Breast Pump Pump Review: Setup, frequency, and cleaning;Milk Storage;Other (comment) (nipple shiled and  SNS)   Consult Status Consult Status: Complete Follow-up type: Call as needed    Alfred LevinsLee, Cecille Mcclusky Anne 09/17/2013, 12:19 PM

## 2013-09-23 ENCOUNTER — Telehealth: Payer: Self-pay | Admitting: Obstetrics & Gynecology

## 2013-09-23 NOTE — Telephone Encounter (Signed)
Pt states delivered 09/14/2013 having a lot of bright red vaginal bleeding. Is this normal. Informed pt is normal to have vaginal bleeding postpartum could last up to 6 weeks but should gradually improve. Pt encouraged to continue PNV since she is breastfeeding. Pt verbalized understanding.

## 2013-10-11 ENCOUNTER — Ambulatory Visit (INDEPENDENT_AMBULATORY_CARE_PROVIDER_SITE_OTHER): Payer: Medicaid Other | Admitting: Women's Health

## 2013-10-11 ENCOUNTER — Encounter: Payer: Self-pay | Admitting: Women's Health

## 2013-10-11 VITALS — BP 110/84 | Ht 67.0 in | Wt 252.0 lb

## 2013-10-11 DIAGNOSIS — O0990 Supervision of high risk pregnancy, unspecified, unspecified trimester: Secondary | ICD-10-CM

## 2013-10-11 DIAGNOSIS — Z8759 Personal history of other complications of pregnancy, childbirth and the puerperium: Secondary | ICD-10-CM | POA: Insufficient documentation

## 2013-10-11 NOTE — Progress Notes (Signed)
Patient ID: Julia Johnson, female   DOB: 1981-03-06, 33 y.o.   MRN: [id number] Subjective:    Julia Doorrin Grimes is a 33 y.o. 393P2012 Caucasian female who presents for a postpartum visit. She is 4 weeks postpartum following a spontaneous vaginal delivery at 3739 gestational weeks after IOL for GHTN. Anesthesia: IV pain meds. I have fully reviewed the prenatal and intrapartum course. Postpartum course has been uncomplicated. Pt[facility] after baby was born d/t complications/unknown etiology, so pt has been under a lot of stress.  Baby's course has been complicated at birth by low sugars, then some hyperbilirubinemia, doing well now. Baby is feeding by breast. Bleeding thin lochia. Bowel function is normal. Bladder function is normal. Patient is not sexually active. Last sexual activity: prior to birth of child. Contraception method is none and plans for condoms. Postpartum depression screening: negative. Score 5.  Last pap 12/2012 and was neg w/ -HRHPV.  The following portions of the patient's history were reviewed and updated as appropriate: allergies, current medications, past medical history, past surgical history and problem list.  Review of Systems Pertinent items are noted in HPI.   Filed Vitals:   10/11/13 1142  BP: 110/84  Height: 5\' 7"  (1.702 m)  Weight: 252 lb (114.306 kg)   No LMP recorded.  Objective:   General:  alert, cooperative and no distress   Breasts:  deferred, no complaints  Lungs: clear to auscultation bilaterally  Heart:  regular rate and rhythm  Abdomen: soft, nontender   Vulva: normal  Vagina: normal vagina  Cervix:  closed  Corpus: Well-involuted  Adnexa:  Non-palpable  Rectal Exam: no hemorrhoids        Assessment:   Postpartum exam 4 wks s/p SVD after IOL for GHTN Breastfeeding Depression screening Contraception counseling   Plan:  Contraception: condoms Follow up in: 1 year for physical or earlier if needed  Marge DuncansBooker,  Jaleisa Brose Randall CNM, [facility]NP-BC 10/11/2013 12:23 PM

## 2013-11-11 ENCOUNTER — Ambulatory Visit (HOSPITAL_COMMUNITY)
Admission: RE | Admit: 2013-11-11 | Discharge: 2013-11-11 | Disposition: A | Discharge status: Home / Self Care | Payer: BC Managed Care – PPO | Source: Ambulatory Visit | Attending: Obstetrics & Gynecology | Admitting: Obstetrics & Gynecology

## 2013-11-11 NOTE — Lactation Note (Signed)
Lactation Consult  Mother's reason for visit:  Trouble with gas, reflux and pulling off breast  Visit Type:  Feeding assessment Appointment Notes:  None Consult:  Initial Lactation Consultant:  Huston FoleyMOULDEN, Rachelanne Whidby S  ________________________________________________________________________  Baby's Name: Julia SeashoreLydia Johnson  Date of Birth: 09/14/2013  Pediatrician: [facility]  Gender: female  Gestational Age: 1579w0d (At Birth)  Birth Weight: 9 lb 11.7 oz (4415 g)  Weight at Discharge: Weight: 9 lb 1.5 oz (4125 g) Date of Discharge: 09/17/2013  Filed Weights   09/14/13 2335 09/16/13 0001 09/17/13 0005  Weight: 9 lb 11.7 oz (4415 g) 9 lb 5.2 oz (4230 g) 9 lb 1.5 oz (4125 g)  [facility] 9-5 on 11/08/13 Location:Pediatrician's office  Weight today: 11-9.2   ________________________________________________________________________  Mother's Name: Julia DoorErin Johnson Type of delivery:  vaginal Breastfeeding Experience:  Breastfed first baby for 18 months ________________________________________________________________________  Breastfeeding History (Post Discharge)  Frequency of breastfeeding:  Every 2-3 hours Duration of feeding:  10-15 minutes    Pumping  Type of pump:  Unknown Frequency:  Prn when away from baby Volume:  90-18020ml  Infant Intake and Output Assessment  Voids:  6-10 in 24 hrs.  Color:  Clear yellow Stools:  6-10 in 24 hrs.  Color:  Green and Yellow  ________________________________________________________________________  Maternal Breast Assessment  Breast:  Full Nipple:  Erect Pain level:  0   _______________________________________________________________________ Feeding Assessment/Evaluation  Mom and 458 week old baby here for feeding assessment and consultation.  Baby has been latching well and gaining well but mom has some concerns related to spitting and periods of painful gas.  Observed baby latch well to the breast  and nurse actively.  Baby started pulling off when milk flow slowed but when switching to other breast she calmed and nursed well.  Mom states baby does spit and she discussed reflux and spitting with her pediatrician this week.  She states pediatrician was not concerned.  Spitting is rarely projectile.  Mom states baby is generally calm with fussy periods off and on.  Discussed growth spurts and cluster feeding.  Reviewed dietary changes that may be helpful for mom to try.  Answered questions and encouraged to call us with any concerns.  Mom is very motivated.  Initial feeding assessment:  Infant's oral assessment:  WNL  Positioning:  Cradle Right breast/left breast  LATCH documentation:  Latch:  2 = Grasps breast easily, tongue down, lips flanged, rhythmical sucking.  Audible swallowing:  2 = Spontaneous and intermittent  Type of nipple:  2 = Everted at rest and after stimulation  Comfort (Breast/Nipple):  2 = Soft / non-tender  Hold (Positioning):  2 = No assistance needed to correctly position infant at breast  LATCH score:  10  Attached assessment:  Deep  Lips flanged:  Yes.    Lips untucked:  No.  Suck assessment:  Nutritive  Tools: NONE Instructed on use and cleaning of tool:  Yes.    Pre-feed weight:  5252 g   Post-feed weight:  5353 g  Amount transferred:  101 ml Amount supplemented:  0 ml      Total amount transferred:  101 ml Total supplement given:  0 ml

## 2014-01-24 ENCOUNTER — Encounter: Payer: Self-pay | Admitting: Women's Health

## 2014-05-27 ENCOUNTER — Encounter (INDEPENDENT_AMBULATORY_CARE_PROVIDER_SITE_OTHER): Payer: Self-pay | Admitting: *Deleted

## 2014-09-12 ENCOUNTER — Ambulatory Visit (INDEPENDENT_AMBULATORY_CARE_PROVIDER_SITE_OTHER): Payer: Medicaid Other | Admitting: Internal Medicine

## 2014-10-10 ENCOUNTER — Encounter (HOSPITAL_COMMUNITY): Payer: Self-pay | Admitting: *Deleted

## 2014-10-10 ENCOUNTER — Emergency Department (HOSPITAL_COMMUNITY)
Admission: EM | Admit: 2014-10-10 | Discharge: 2014-10-10 | Disposition: A | Discharge status: Home / Self Care | Payer: Medicaid Other | Attending: Emergency Medicine | Admitting: Emergency Medicine

## 2014-10-10 DIAGNOSIS — E669 Obesity, unspecified: Secondary | ICD-10-CM | POA: Insufficient documentation

## 2014-10-10 DIAGNOSIS — Z8659 Personal history of other mental and behavioral disorders: Secondary | ICD-10-CM | POA: Insufficient documentation

## 2014-10-10 DIAGNOSIS — Z79899 Other long term (current) drug therapy: Secondary | ICD-10-CM | POA: Insufficient documentation

## 2014-10-10 DIAGNOSIS — Z8669 Personal history of other diseases of the nervous system and sense organs: Secondary | ICD-10-CM | POA: Insufficient documentation

## 2014-10-10 DIAGNOSIS — Z3202 Encounter for pregnancy test, result negative: Secondary | ICD-10-CM | POA: Insufficient documentation

## 2014-10-10 DIAGNOSIS — N390 Urinary tract infection, site not specified: Secondary | ICD-10-CM

## 2014-10-10 HISTORY — DX: Celiac disease: K90.0

## 2014-10-10 LAB — URINALYSIS, ROUTINE W REFLEX MICROSCOPIC
Bilirubin Urine: NEGATIVE
GLUCOSE, UA: NEGATIVE mg/dL
KETONES UR: NEGATIVE mg/dL
Nitrite: NEGATIVE
Protein, ur: NEGATIVE mg/dL
SPECIFIC GRAVITY, URINE: 1.025 (ref 1.005–1.030)
Urobilinogen, UA: 0.2 mg/dL (ref 0.0–1.0)
pH: 5.5 (ref 5.0–8.0)

## 2014-10-10 LAB — URINE MICROSCOPIC-ADD ON

## 2014-10-10 LAB — I-STAT BETA HCG BLOOD, ED (MC, WL, AP ONLY)

## 2014-10-10 LAB — PREGNANCY, URINE: Preg Test, Ur: NEGATIVE

## 2014-10-10 MED ORDER — IBUPROFEN 800 MG PO TABS
800.0000 mg | ORAL_TABLET | Freq: Once | ORAL | Status: DC
  Filled 2014-10-10: qty 1

## 2014-10-10 MED ORDER — CEPHALEXIN 500 MG PO CAPS
500.0000 mg | ORAL_CAPSULE | Freq: Once | ORAL | Status: DC
  Filled 2014-10-10: qty 1

## 2014-10-10 MED ORDER — NITROFURANTOIN MONOHYD MACRO 100 MG PO CAPS: 100.0000 mg | ORAL_CAPSULE | Freq: Once | ORAL | Status: DC

## 2014-10-10 MED ORDER — NITROFURANTOIN MONOHYD MACRO 100 MG PO CAPS
ORAL_CAPSULE | ORAL | Status: AC
  Administered 2014-10-10: 100 mg
  Filled 2014-10-10: qty 1

## 2014-10-10 MED ORDER — NITROFURANTOIN MACROCRYSTAL 100 MG PO CAPS: 100.0000 mg | ORAL_CAPSULE | Freq: Four times a day (QID) | ORAL | Status: DC

## 2014-10-10 NOTE — ED Notes (Signed)
In to medicated pt. Pt states she is still breast feeding and does not want to take the Keflex

## 2014-10-10 NOTE — ED Notes (Signed)
Patient reports low back pain at times with urinary frequency. Denies dysuria. Patient also reports possible pregnancy.

## 2014-10-10 NOTE — Discharge Instructions (Signed)

## 2014-10-10 NOTE — ED Provider Notes (Signed)
CSN: [id number]     Arrival date & time 10/10/14  0930 History   First MD Initiated Contact with Patient 10/10/14 1012     Chief Complaint  Patient presents with  . Back Pain     (Consider location/radiation/quality/duration/timing/severity/associated sxs/prior Treatment) HPI   Julia Johnson is a 34 y.o. female who presents to the Emergency Department complaining of back pain, urinary frequency and feeling like she is unable to completely empty her bladder.  symptoms have been present for four days.  Began noticing diffuse lower back pain initially, then noticing increased frequency.  She reports generalized malaise as well.  She states that she also may be pregnant.  She has not taken any medications for her symptoms.  She denies fever, vomiting, nausea, burning with urination, hematuria, vaginal bleeding or discharge, and abdominal pain.  She states that her first pregnancy was not detected by urine test.      Past Medical History  Diagnosis Date  . Carpal tunnel syndrome   . Depression   . ADHD (attention deficit hyperactivity disorder)   . Hyperlipidemia   . Obesity   . Anxiety   . UTI (urinary tract infection)   . Pregnancy induced hypertension   . Celiac disease    Past Surgical History  Procedure Laterality Date  . Mouth surgery    . Skin biopsy  2012   Family History  Problem Relation Age of Onset  . Cancer Mother     breast  . Diabetes Mother   . Factor V Leiden deficiency Mother   . Hypertension Father   . Diabetes Father   . Aneurysm Father   . Diabetes Maternal Grandmother   . Congestive Heart Failure Maternal Grandmother   . Celiac disease Daughter   . Diabetes Paternal Grandfather   . Congestive Heart Failure Paternal Grandfather    History  Substance Use Topics  . Smoking status: Never Smoker   . Smokeless tobacco: Never Used  . Alcohol Use: No   OB History    Gravida Para Term Preterm AB TAB SAB Ectopic Multiple Living   Review of Systems  Constitutional: Negative for fever, chills, activity change and appetite change.  Respiratory: Negative for chest tightness and shortness of breath.   Gastrointestinal: Negative for nausea, vomiting and abdominal pain.  Genitourinary: Positive for dysuria, urgency and frequency. Negative for hematuria, flank pain, decreased urine volume and difficulty urinating.  Musculoskeletal: Negative for back pain.  Skin: Negative for rash.  Neurological: Negative for dizziness, weakness and numbness.  Hematological: Negative for adenopathy.  Psychiatric/Behavioral: Negative for confusion.  All other systems reviewed and are negative.     Allergies  Review of patient's allergies indicates no known allergies.  Home Medications   Prior to Admission medications   Medication Sig Start Date End Date Taking? Authorizing Provider  cetirizine (ZYRTEC) 10 MG tablet Take 10 mg by mouth daily as needed for allergies.   Yes Historical Provider, MD  ibuprofen (ADVIL,MOTRIN) 600 MG tablet Take 1 tablet (600 mg total) by mouth every 6 (six) hours. 09/16/13  Yes Arabella Merles, CNM  Prenatal Vit-Fe Fumarate-FA (PNV PRENATAL PLUS MULTIVITAMIN) 27-1 MG TABS Take 1 tablet by mouth once. 03/23/13  Yes Scarlette Calico Cresenzo-Dishmon, CNM   BP 139/98 mmHg  Pulse 95  Temp(Src) 98 F (36.7 C) (Oral)  Resp 18  Ht  (1.702 m)  Wt 250 lb (113.399 kg)  BMI 39.15 kg/m2  SpO2 100%  LMP 09/10/2014 Physical Exam  Constitutional: She is oriented to person, place, and time. She appears well-developed and well-nourished. No distress.  HENT:  Head: Normocephalic and atraumatic.  Cardiovascular: Normal rate, regular rhythm, normal heart sounds and intact distal pulses.   No murmur heard. Pulmonary/Chest: Effort normal and breath sounds normal. No respiratory distress. She has no wheezes. She has no rales.  Abdominal: Soft. Normal appearance. She exhibits no distension and no mass. There is no  hepatosplenomegaly. There is tenderness in the suprapubic area. There is no rigidity, no rebound, no guarding, no CVA tenderness and no tenderness at McBurney's point.  Mild ttp of the suprapubic region.  Remaining abdomen is soft, non-tender without guarding or rebound tenderness. No CVA tenderness  Musculoskeletal: Normal range of motion. She exhibits no edema.  Mild lower bilateral lumbar muscle tenderness on exam.  No spinal tenderness.  5/5 strength against resistance of bilateral lower extremities.   Neurological: She is alert and oriented to person, place, and time. Coordination normal.  Skin: Skin is warm and dry. No rash noted.  Nursing note and vitals reviewed.   ED Course  Procedures (including critical care time) Labs Review Labs Reviewed  URINALYSIS, ROUTINE W REFLEX MICROSCOPIC ([facility]) - Abnormal; Notable for the following:    APPearance HAZY (*)    Hgb urine dipstick SMALL (*)    Leukocytes, UA LARGE (*)    All other components within normal limits  URINE MICROSCOPIC-ADD ON - Abnormal; Notable for the following:    Squamous Epithelial / LPF MANY (*)    Bacteria, UA MANY (*)    All other components within normal limits  URINE CULTURE  PREGNANCY, URINE  I-STAT BETA HCG BLOOD, ED ([facility], [facility], [facility] ONLY)    Imaging Review No results found.   EKG Interpretation None      Urine culture pending.    MDM   Final diagnoses:  UTI (lower urinary tract infection)   Pt is well appearing, non-toxic.  No fever, CVA tenderness or vomiting to suggest pyelonephritis.    She agrees to increase water intake, abx and close f/u with her PMD or to return here if needed.      Pauline Ausammy Nona Gracey, PA-C 10/10/14 1636  Vanetta MuldersScott Zackowski, MD 10/12/14 [phone]

## 2014-10-12 LAB — URINE CULTURE

## 2014-11-05 ENCOUNTER — Inpatient Hospital Stay (HOSPITAL_COMMUNITY)
Admission: AD | Admit: 2014-11-05 | Discharge: 2014-11-05 | Disposition: A | Discharge status: Home / Self Care | Payer: Self-pay | Source: Ambulatory Visit | Attending: Obstetrics & Gynecology | Admitting: Obstetrics & Gynecology

## 2014-11-05 DIAGNOSIS — N61 Inflammatory disorders of breast: Secondary | ICD-10-CM | POA: Insufficient documentation

## 2014-11-05 DIAGNOSIS — N644 Mastodynia: Secondary | ICD-10-CM | POA: Insufficient documentation

## 2014-11-05 MED ORDER — CEPHALEXIN 500 MG PO CAPS: 500.0000 mg | ORAL_CAPSULE | Freq: Four times a day (QID) | ORAL | Status: DC

## 2014-11-05 NOTE — MAU Provider Note (Signed)
History     CSN: [id number]  Arrival date and time: 11/05/14 4098   First Provider Initiated Contact with Patient 11/05/14 1957      Chief Complaint  Patient presents with  . Breast Pain   HPI Ms. Julia Johnson is a 34 y.o. [id number] who presents to MAU today with complaint of breast tenderness and low-grade fever since earlier this evening. She states that her child, who is 49 months old, spent the night away from home for the first time and she did not pump or breastfeed all night. She feels that the breast tenderness has persisted on the right side even though she has pumped and breastfed since onset. She states low-grade fever of 99.8 F tonight and chills. She denies UTI symptoms or URI symptoms.   OB History    Gravida Para Term Preterm AB TAB SAB Ectopic Multiple Living   Past Medical History  Diagnosis Date  . Carpal tunnel syndrome   . Depression   . ADHD (attention deficit hyperactivity disorder)   . Hyperlipidemia   . Obesity   . Anxiety   . UTI (urinary tract infection)   . Pregnancy induced hypertension   . Celiac disease     Past Surgical History  Procedure Laterality Date  . Mouth surgery    . Skin biopsy  2012    Family History  Problem Relation Age of Onset  . Cancer Mother     breast  . Diabetes Mother   . Factor V Leiden deficiency Mother   . Hypertension Father   . Diabetes Father   . Aneurysm Father   . Diabetes Maternal Grandmother   . Congestive Heart Failure Maternal Grandmother   . Celiac disease Daughter   . Diabetes Paternal Grandfather   . Congestive Heart Failure Paternal Grandfather     Social History  Substance Use Topics  . Smoking status: Never Smoker   . Smokeless tobacco: Never Used  . Alcohol Use: No    Allergies: No Known Allergies  Prescriptions prior to admission  Medication Sig Dispense Refill Last Dose  . cetirizine (ZYRTEC) 10 MG tablet Take 10 mg by mouth daily as needed for allergies.    Past Month at Unknown time  . ibuprofen (ADVIL,MOTRIN) 600 MG tablet Take 1 tablet (600 mg total) by mouth every 6 (six) hours. 30 tablet 0 Past Month at Unknown time  . nitrofurantoin (MACRODANTIN) 100 MG capsule Take 1 capsule (100 mg total) by mouth 4 (four) times daily. For 7 days 28 capsule 0   . Prenatal Vit-Fe Fumarate-FA (PNV PRENATAL PLUS MULTIVITAMIN) 27-1 MG TABS Take 1 tablet by mouth once. 30 tablet 11 Past Month at Unknown time    Review of Systems  Constitutional: Positive for fever, chills and malaise/fatigue.  HENT: Negative for congestion and sore throat.   Respiratory: Negative for cough.   Genitourinary: Negative for dysuria, urgency and frequency.       + breast pain  Skin: Negative for rash.   Physical Exam   Blood pressure 130/78, pulse 132, temperature 99.4 F (37.4 C), temperature source Oral, resp. rate 16, height  (1.702 m), weight 244 lb 4 oz (110.791 kg), last menstrual period 10/04/2014, SpO2 98 %, currently breastfeeding.  Physical Exam  Nursing note and vitals reviewed. Constitutional: She is oriented to person, place, and time. She appears well-developed and well-nourished. No distress.  HENT:  Head: Normocephalic  and atraumatic.  Cardiovascular: Normal rate.   Respiratory: Effort normal. Right breast exhibits tenderness (mild to moderate tenderness to palpation of the right breast most prominent around 9 o'clock). Right breast exhibits no inverted nipple, no mass, no nipple discharge and no skin change. Left breast exhibits no inverted nipple, no mass, no nipple discharge, no skin change and no tenderness. Breasts are symmetrical.  GI: Soft. She exhibits no distension and no mass. There is no tenderness. There is no rebound and no guarding.  Neurological: She is alert and oriented to person, place, and time.  Skin: Skin is warm and dry. No erythema.  Psychiatric: She has a normal mood and affect.   MAU Course  Procedures None  MDM Discussed  with Dr. Macon Large. She suggests treatment with Keflex for possible early mastitis given tenderness, low-grade fever and tachycardia.   Assessment and Plan  A: Mastitis, acute, right  P: Discharge home Rx for Keflex given to patient Warning signs for worsening condition discussed Patient advised to follow-up with [facility] if symptoms persist or worsen Patient may return to MAU as needed or if her condition were to change or worsen   Marny Lowenstein, PA-C  11/05/2014, 8:00 PM

## 2014-11-05 NOTE — Discharge Instructions (Signed)
Breastfeeding and Mastitis °Mastitis is inflammation of the breast tissue. It can occur in women who are breastfeeding. This can make breastfeeding painful. Mastitis will sometimes go away on its own. Your health care provider will help determine if treatment is needed. °CAUSES °Mastitis is often associated with a blocked milk (lactiferous) duct. This can happen when too much milk builds up in the breast. Causes of excess milk in the breast can include: °· Poor latch-on. If your baby is not latched onto the breast properly, she or he may not empty your breast completely while breastfeeding. °· Allowing too much time to pass between feedings. °· Wearing a bra or other clothing that is too tight. This puts extra pressure on the lactiferous ducts so milk does not flow through them as it should. °Mastitis can also be caused by a bacterial infection. Bacteria may enter the breast tissue through cuts or openings in the skin. In women who are breastfeeding, this may occur because of cracked or irritated skin. Cracks in the skin are often caused when your baby does not latch on properly to the breast. °SIGNS AND SYMPTOMS °· Swelling, redness, tenderness, and pain in an area of the breast. °· Swelling of the glands under the arm on the same side. °· Fever may or may not accompany mastitis. °If an infection is allowed to progress, a collection of pus (abscess) may develop. °DIAGNOSIS  °Your health care provider can usually diagnose mastitis based on your symptoms and a physical exam. Tests may be done to help confirm the diagnosis. These may include: °· Removal of pus from the breast by applying pressure to the area. This pus can be examined in the lab to determine which bacteria are present. If an abscess has developed, the fluid in the abscess can be removed with a needle. This can also be used to confirm the diagnosis and determine the bacteria present. In most cases, pus will not be present. °· Blood tests to determine if  your body is fighting a bacterial infection. °· Mammogram or ultrasound tests to rule out other problems or diseases. °TREATMENT  °Mastitis that occurs with breastfeeding will sometimes go away on its own. Your health care provider may choose to wait 24 hours after first seeing you to decide whether a prescription medicine is needed. If your symptoms are worse after 24 hours, your health care provider will likely prescribe an antibiotic medicine to treat the mastitis. He or she will determine which bacteria are most likely causing the infection and will then select an appropriate antibiotic medicine. This is sometimes changed based on the results of tests performed to identify the bacteria, or if there is no response to the antibiotic medicine selected. Antibiotic medicines are usually given by mouth. You may also be given medicine for pain. °HOME CARE INSTRUCTIONS °· Only take over-the-counter or prescription medicines for pain, fever, or discomfort as directed by your health care provider. °· If your health care provider prescribed an antibiotic medicine, take the medicine as directed. Make sure you finish it even if you start to feel better. °· Do not wear a tight or underwire bra. Wear a soft, supportive bra. °· Increase your fluid intake, especially if you have a fever. °· Continue to empty the breast. Your health care provider can tell you whether this milk is safe for your infant or needs to be thrown out. You may be told to stop nursing until your health care provider thinks it is safe for your baby.   Use a breast pump if you are advised to stop nursing. °· Keep your nipples clean and dry. °· Empty the first breast completely before going to the other breast. If your baby is not emptying your breasts completely for some reason, use a breast pump to empty your breasts. °· If you go back to work, pump your breasts while at work to stay in time with your nursing schedule. °· Avoid allowing your breasts to become  overly filled with milk (engorged). °SEEK MEDICAL CARE IF: °· You have pus-like discharge from the breast. °· Your symptoms do not improve with the treatment prescribed by your health care provider within 2 days. °SEEK IMMEDIATE MEDICAL CARE IF: °· Your pain and swelling are getting worse. °· You have pain that is not controlled with medicine. °· You have a red line extending from the breast toward your armpit. °· You have a fever or persistent symptoms for more than 2-3 days. °· You have a fever and your symptoms suddenly get worse. °MAKE SURE YOU:  °· Understand these instructions. °· Will watch your condition. °· Will get help right away if you are not doing well or get worse. °Document Released: 07/06/2004 Document Revised: 03/16/2013 Document Reviewed: 10/15/2012 °[employer]® Patient Information ©2015 [employer]. This information is not intended to replace advice given to you by your health care provider. Make sure you discuss any questions you have with your health care provider. ° °

## 2014-11-05 NOTE — MAU Note (Signed)
Right breast pain.  Breastfeeding. Didn't pump for 18 hours because child went to spend night at grandma's .  Child is 71 months old.  Pumped this morning and then nursed baby 3 times then and developed chills and fever this afternoon.  Temp at home 99.8 axillary.

## 2014-12-22 ENCOUNTER — Telehealth: Payer: Self-pay | Admitting: *Deleted

## 2014-12-22 NOTE — Telephone Encounter (Signed)
Pt c/o period 11/24/2014 and started bleeding again 12/21/2014. Pt c/o heavy bright red vaginal bleeding x 1, 1 pad every hour, hx of anemia,  no cramping. Pt states she does not have insurance and does not have the money to be seen. Pt advised to go to ER if bleeding persist. Pt is taking PNV and iron supplements due to hx of anemia. Pt verbalized understanding.

## 2015-02-09 ENCOUNTER — Encounter (HOSPITAL_COMMUNITY): Payer: Self-pay | Admitting: Emergency Medicine

## 2015-02-09 ENCOUNTER — Emergency Department (HOSPITAL_COMMUNITY): Payer: Medicaid Other

## 2015-02-09 ENCOUNTER — Emergency Department (HOSPITAL_COMMUNITY)
Admission: EM | Admit: 2015-02-09 | Discharge: 2015-02-09 | Disposition: A | Discharge status: Home / Self Care | Payer: Medicaid Other | Attending: Emergency Medicine | Admitting: Emergency Medicine

## 2015-02-09 DIAGNOSIS — R0789 Other chest pain: Secondary | ICD-10-CM | POA: Insufficient documentation

## 2015-02-09 DIAGNOSIS — Z792 Long term (current) use of antibiotics: Secondary | ICD-10-CM | POA: Insufficient documentation

## 2015-02-09 DIAGNOSIS — Z87440 Personal history of urinary (tract) infections: Secondary | ICD-10-CM | POA: Insufficient documentation

## 2015-02-09 DIAGNOSIS — Z8669 Personal history of other diseases of the nervous system and sense organs: Secondary | ICD-10-CM | POA: Insufficient documentation

## 2015-02-09 DIAGNOSIS — Z8659 Personal history of other mental and behavioral disorders: Secondary | ICD-10-CM | POA: Insufficient documentation

## 2015-02-09 DIAGNOSIS — Z8719 Personal history of other diseases of the digestive system: Secondary | ICD-10-CM | POA: Insufficient documentation

## 2015-02-09 DIAGNOSIS — E669 Obesity, unspecified: Secondary | ICD-10-CM | POA: Insufficient documentation

## 2015-02-09 DIAGNOSIS — J40 Bronchitis, not specified as acute or chronic: Secondary | ICD-10-CM | POA: Insufficient documentation

## 2015-02-09 LAB — CBC WITH DIFFERENTIAL/PLATELET
Basophils Absolute: 0 10*3/uL (ref 0.0–0.1)
Basophils Relative: 0 %
EOS PCT: 3 %
Eosinophils Absolute: 0.3 10*3/uL (ref 0.0–0.7)
HCT: 40 % (ref 36.0–46.0)
Hemoglobin: 13.4 g/dL (ref 12.0–15.0)
LYMPHS ABS: 2.8 10*3/uL (ref 0.7–4.0)
Lymphocytes Relative: 34 %
MCH: 30.2 pg (ref 26.0–34.0)
MCHC: 33.5 g/dL (ref 30.0–36.0)
MCV: 90.3 fL (ref 78.0–100.0)
MONOS PCT: 7 %
Monocytes Absolute: 0.5 10*3/uL (ref 0.1–1.0)
Neutro Abs: 4.6 10*3/uL (ref 1.7–7.7)
Neutrophils Relative %: 56 %
PLATELETS: 173 10*3/uL (ref 150–400)
RBC: 4.43 MIL/uL (ref 3.87–5.11)
RDW: 14.6 % (ref 11.5–15.5)
WBC: 8.2 10*3/uL (ref 4.0–10.5)

## 2015-02-09 LAB — COMPREHENSIVE METABOLIC PANEL
ALT: 42 U/L (ref 14–54)
AST: 29 U/L (ref 15–41)
Albumin: 4.3 g/dL (ref 3.5–5.0)
Alkaline Phosphatase: 57 U/L (ref 38–126)
Anion gap: 7 (ref 5–15)
BUN: 13 mg/dL (ref 6–20)
CO2: 27 mmol/L (ref 22–32)
Calcium: 9.4 mg/dL (ref 8.9–10.3)
Chloride: 106 mmol/L (ref 101–111)
Creatinine, Ser: 1.06 mg/dL — ABNORMAL HIGH (ref 0.44–1.00)
GFR calc Af Amer: >60 mL/min (ref >60)
GFR calc non Af Amer: >60 mL/min (ref >60)
Glucose, Bld: 97 mg/dL (ref 65–99)
Potassium: 4.5 mmol/L (ref 3.5–5.1)
Sodium: 140 mmol/L (ref 135–145)
Total Bilirubin: 1 mg/dL (ref 0.3–1.2)
Total Protein: 7.2 g/dL (ref 6.5–8.1)

## 2015-02-09 LAB — URINALYSIS, ROUTINE W REFLEX MICROSCOPIC
Bilirubin Urine: NEGATIVE
Glucose, UA: NEGATIVE mg/dL
Hgb urine dipstick: NEGATIVE
Ketones, ur: NEGATIVE mg/dL
Leukocytes, UA: NEGATIVE
Nitrite: NEGATIVE
Protein, ur: NEGATIVE mg/dL
Specific Gravity, Urine: >1.03 — ABNORMAL HIGH (ref 1.005–1.030)
pH: 5.5 (ref 5.0–8.0)

## 2015-02-09 MED ORDER — ALBUTEROL SULFATE HFA 108 (90 BASE) MCG/ACT IN AERS
2.0000 | INHALATION_SPRAY | RESPIRATORY_TRACT | Status: AC
  Administered 2015-02-09: 2 via RESPIRATORY_TRACT
  Filled 2015-02-09: qty 6.7

## 2015-02-09 NOTE — ED Provider Notes (Signed)
CSN: [id number]     Arrival date & time 02/09/15  1530 History   First MD Initiated Contact with Patient 02/09/15 1904     Chief Complaint  Patient presents with  . Shortness of Breath     HPI Pt was seen at 1910. Per pt, c/o gradual onset and persistence of constant right sided chest wall "pain" that began 2 days ago. Pt states the pain began "after we were moving some furniture." Pt states the pain worsens with palpation of the area and body position changes. Pt states she "has been coughing a lot too" which also aggravates the discomfort. Denies direct injury, no abd pain, no N/V/D, no back pain, no palpitations, no fevers, no rash, no calf/LE pain or unilateral swelling.    Breastfeeding Past Medical History  Diagnosis Date  . Carpal tunnel syndrome   . Depression   . ADHD (attention deficit hyperactivity disorder)   . Hyperlipidemia   . Obesity   . Anxiety   . UTI (urinary tract infection)   . Pregnancy induced hypertension   . Celiac disease    Past Surgical History  Procedure Laterality Date  . Mouth surgery    . Skin biopsy  2012   Family History  Problem Relation Age of Onset  . Cancer Mother     breast  . Diabetes Mother   . Factor V Leiden deficiency Mother   . Hypertension Father   . Diabetes Father   . Aneurysm Father   . Diabetes Maternal Grandmother   . Congestive Heart Failure Maternal Grandmother   . Celiac disease Daughter   . Diabetes Paternal Grandfather   . Congestive Heart Failure Paternal Grandfather    Social History  Substance Use Topics  . Smoking status: Never Smoker   . Smokeless tobacco: Never Used  . Alcohol Use: No   OB History    Gravida Para Term Preterm AB TAB SAB Ectopic Multiple Living   3 2 2  1  1   2      Review of Systems ROS: Statement: All systems negative except as marked or noted in the HPI; Constitutional: Negative for fever and chills. ; ; Eyes: Negative for eye pain, redness and discharge. ; ; ENMT: Negative for  ear pain, hoarseness, nasal congestion, sinus pressure and sore throat. ; ; Cardiovascular: Negative for palpitations, diaphoresis, dyspnea and peripheral edema. ; ; Respiratory: +cough. Negative for wheezing and stridor. ; ; Gastrointestinal: Negative for nausea, vomiting, diarrhea, abdominal pain, blood in stool, hematemesis, jaundice and rectal bleeding. . ; ; Genitourinary: Negative for dysuria, flank pain and hematuria. ; ; Musculoskeletal: +chest wall pain. Negative for back pain and neck pain. Negative for swelling and trauma.; ; Skin: Negative for pruritus, rash, abrasions, blisters, bruising and skin lesion.; ; Neuro: Negative for headache, lightheadedness and neck stiffness. Negative for weakness, altered level of consciousness , altered mental status, extremity weakness, paresthesias, involuntary movement, seizure and syncope.      Allergies  Gluten meal  Home Medications   Prior to Admission medications   Medication Sig Start Date End Date Taking? Authorizing Provider  cephALEXin (KEFLEX) 500 MG capsule Take 1 capsule (500 mg total) by mouth 4 (four) times daily. 11/05/14   Marny LowensteinJulie N Wenzel, PA-C  cetirizine (ZYRTEC) 10 MG tablet Take 10 mg by mouth daily as needed for allergies.    Historical Provider, MD  ibuprofen (ADVIL,MOTRIN) 600 MG tablet Take 1 tablet (600 mg total) by mouth every 6 (six) hours. 09/16/13  Arabella Merles, CNM  nitrofurantoin (MACRODANTIN) 100 MG capsule Take 1 capsule (100 mg total) by mouth 4 (four) times daily. For 7 days 10/10/14   Pauline Aus, PA-C  Prenatal Vit-Fe Fumarate-FA (PNV PRENATAL PLUS MULTIVITAMIN) 27-1 MG TABS Take 1 tablet by mouth once. 03/23/13   Jacklyn Shell, CNM   BP 114/86 mmHg  Pulse 85  Temp(Src) 98.1 F (36.7 C) (Oral)  Resp 20  Ht  (1.702 m)  Wt 245 lb (111.131 kg)  BMI 38.36 kg/m2  SpO2 99%  LMP 01/20/2015 Physical Exam  1915: Physical examination:  Nursing notes reviewed; Vital signs and O2 SAT reviewed;   Constitutional: Well developed, Well nourished, Well hydrated, In no acute distress; Head:  Normocephalic, atraumatic; Eyes: EOMI, PERRL, No scleral icterus; ENMT: TM's clear bilat. +edemetous nasal turbinates bilat with clear rhinorrhea. Mouth and pharynx normal, Mucous membranes moist; Neck: Supple, Full range of motion, No lymphadenopathy; Cardiovascular: Regular rate and rhythm, No murmur, rub, or gallop; Respiratory: Breath sounds clear & equal bilaterally, No wheezes.  Speaking full sentences with ease, Normal respiratory effort/excursion; Chest: +right lateral chest wall tender to palp. No rash, no soft tissue crepitus, no deformity. Movement normal; Abdomen: Soft, Nontender, Nondistended, Normal bowel sounds; Genitourinary: No CVA tenderness; Extremities: Pulses normal, No tenderness, No edema, No calf edema or asymmetry.; Neuro: AA&Ox3, Major CN grossly intact.  Speech clear. No gross focal motor or sensory deficits in extremities.; Skin: Color normal, Warm, Dry.   ED Course  Procedures (including critical care time) Labs Review   Imaging Review  I have personally reviewed and evaluated these images and lab results as part of my medical decision-making.   EKG Interpretation None      MDM  MDM Reviewed: previous chart, nursing note and vitals Interpretation: labs and x-ray      Results for orders placed or performed during the hospital encounter of 02/09/15  Urinalysis, Routine w reflex microscopic (not at [facility])  Result Value Ref Range   Color, Urine YELLOW YELLOW   APPearance CLEAR CLEAR   Specific Gravity, Urine >1.030 (H) 1.005 - 1.030   pH 5.5 5.0 - 8.0   Glucose, UA NEGATIVE NEGATIVE mg/dL   Hgb urine dipstick NEGATIVE NEGATIVE   Bilirubin Urine NEGATIVE NEGATIVE   Ketones, ur NEGATIVE NEGATIVE mg/dL   Protein, ur NEGATIVE NEGATIVE mg/dL   Nitrite NEGATIVE NEGATIVE   Leukocytes, UA NEGATIVE NEGATIVE  CBC with Differential  Result Value Ref Range   WBC 8.2 4.0 -  10.5 K/uL   RBC 4.43 3.87 - 5.11 MIL/uL   Hemoglobin 13.4 12.0 - 15.0 g/dL   HCT 16.1 09.6 - 04.5 %   MCV 90.3 78.0 - 100.0 fL   MCH 30.2 26.0 - 34.0 pg   MCHC 33.5 30.0 - 36.0 g/dL   RDW 40.9 81.1 - 91.4 %   Platelets 173 150 - 400 K/uL   Neutrophils Relative % 56 %   Neutro Abs 4.6 1.7 - 7.7 K/uL   Lymphocytes Relative 34 %   Lymphs Abs 2.8 0.7 - 4.0 K/uL   Monocytes Relative 7 %   Monocytes Absolute 0.5 0.1 - 1.0 K/uL   Eosinophils Relative 3 %   Eosinophils Absolute 0.3 0.0 - 0.7 K/uL   Basophils Relative 0 %   Basophils Absolute 0.0 0.0 - 0.1 K/uL  Comprehensive metabolic panel  Result Value Ref Range   Sodium 140 135 - 145 mmol/L   Potassium 4.5 3.5 - 5.1 mmol/L   Chloride 106  101 - 111 mmol/L   CO2 27 22 - 32 mmol/L   Glucose, Bld 97 65 - 99 mg/dL   BUN 13 6 - 20 mg/dL   Creatinine, Ser 1.61 (H) 0.44 - 1.00 mg/dL   Calcium 9.4 8.9 - 09.6 mg/dL   Total Protein 7.2 6.5 - 8.1 g/dL   Albumin 4.3 3.5 - 5.0 g/dL   AST 29 15 - 41 U/L   ALT 42 14 - 54 U/L   Alkaline Phosphatase 57 38 - 126 U/L   Total Bilirubin 1.0 0.3 - 1.2 mg/dL   GFR calc non Af Amer >60 >60 mL/min   GFR calc Af Amer >60 >60 mL/min   Anion gap 7 5 - 15   Dg Chest 2 View 02/09/2015  CLINICAL DATA:  Short of breath. Right rib cage pain. Nausea and cough. EXAM: CHEST  2 VIEW COMPARISON:  None. FINDINGS: The heart size and mediastinal contours are within normal limits. Both lungs are clear. The visualized skeletal structures are unremarkable. IMPRESSION: No active cardiopulmonary disease. Electronically Signed   By: Signa Kell M.D.   On: 02/09/2015 17:18    1935:  TTP right lateral chest wall. Abd benign. Workup reassuring. Doubt PE with low risk Wells and PERC negative. Tx cough and msk pain symptomatically at this time. Dx and testing d/w pt and family.  Questions answered.  Verb understanding, agreeable to d/c home with outpt f/u.   Samuel Jester, DO 02/12/15 [phone]

## 2015-02-09 NOTE — Discharge Instructions (Signed)
°Emergency Department Resource Guide °1) Find a Doctor and Pay Out of Pocket °Although you won't have to find out who is covered by your insurance plan, it is a good idea to ask around and get recommendations. You will then need to call the office and see if the doctor you have chosen will accept you as a new patient and what types of options they offer for patients who are self-pay. Some doctors offer discounts or will set up payment plans for their patients who do not have insurance, but you will need to ask so you aren't surprised when you get to your appointment. ° °2) Contact Your Local Health Department °Not all health departments have doctors that can see patients for sick visits, but many do, so it is worth a call to see if yours does. If you don't know where your local health department is, you can check in your phone book. The CDC also has a tool to help you locate your state's health department, and many state websites also have listings of all of their local health departments. ° °3) Find a Walk-in Clinic °If your illness is not likely to be very severe or complicated, you may want to try a walk in clinic. These are popping up all over the country in pharmacies, drugstores, and shopping centers. They're usually staffed by nurse practitioners or physician assistants that have been trained to treat common illnesses and complaints. They're usually fairly quick and inexpensive. However, if you have serious medical issues or chronic medical problems, these are probably not your best option. ° °No Primary Care Doctor: °- Call Health Connect at  832-8000 - they can help you locate a primary care doctor that  accepts your insurance, provides certain services, etc. °- Physician Referral Service- 1-800-533-3463 ° °Chronic Pain Problems: °Organization         Address  Phone   Notes  °[facility]  (336) 297-2271 Patients need to be referred by their primary care doctor.  ° °Medication  Assistance: °Organization         Address  Phone   Notes  °[place] [facility] [address] °[place], [place] [address] (336) 641-8030 --Must be a resident of [place] °-- Must have NO insurance coverage whatsoever (no Medicaid/ Medicare, etc.) °-- The pt. MUST have a primary care doctor that directs their care regularly and follows them in the community °  °[facility]  (866) 331-1348   °[employer]  (888) 892-1162   ° °Agencies that provide inexpensive medical care: °Organization         Address  Phone   Notes  °[facility]  (336) 832-8035   °[facility]    (336) 832-7272   °[facility] [address] °[place], [place] [address] (336) 832-4777   °[facility] [address], °[place] (336) 271-4999   °[facility]    (336) 373-0678   °[facility]    (336) 272-1050   °[facility] ° [address], [place] Phone:  (336) 832-4444, Fax:  (336) 832-4440 Hours of Operation:  9 am - 6 pm, M-F.  Also accepts Medicaid/Medicare and self-pay.  °[facility] ° [address], [place] Phone: (336) 832-3150, Fax: (336) 832-3151. Hours of Operation:  8:30 am - 5:30 pm, M-F.  Also accepts Medicaid and self-pay.  °[facility] [place] [address], [place] Phone: (336) 878-6027   °[facility] [address], [place], [place] (336)723-1848, Ext. 123 Mondays & Thursdays: 7-9 AM.  First 15 patients are seen on a first come, first serve basis. °  ° °Medicaid-accepting [place] Providers: ° °Organization         Address  Phone   Notes  °[facility] [address], [place] (336) 641-2100 Also accepts self-pay patients.  °[facility] [address], [place] ° (336) 856-9996   °[facility] [address], [place]  (336) 288-8857   °[facility] [address] [place] [address], [place] (336) 299-7000   °Veita Bland [address], [place]  ° (336) 373-1557 Only accepts [place] Access Medicaid patients after they have their name applied to their card.  ° °Self-Pay (no insurance) in [place]: ° °Organization         Address  Phone   Notes  °Sickle Cell Patients, [facility] [address], [place] (336) 832-1970   °[facility] [address], [place] (336) 832-4400   °[facility] ° [address], [place] (336) 992-4800   °[facility]/Dr. Osei-Bonsu ° [address] [place] [address], [place] or [address], [place] (336) 841-8500 Phone number for both [place] and [place] locations is the same.  °[facility] [address], [place] (336) 299-0000   °[facility] [address] [place] [address], [place] or [address] (336) 852-7530 °(336) 878-2260   °[facility] [address], [place] (336) 350-1642, phone; (336) 294-5005, fax Sees patients 1st and 3rd Saturday of every month.  Must not qualify for public or private insurance (i.e. Medicaid, Medicare, [place] Health Choice, Veterans' Benefits) • Household income should be no more than 200% of the poverty level •The clinic cannot treat you if you are pregnant or think you are pregnant • Sexually transmitted diseases are not treated at the clinic.  ° ° °Dental Care: °Organization         Address  Phone  Notes  °[place] [facility] [address], [place] (336) 641-6152 Accepts children up to age 21 who are enrolled in Medicaid or [place] Health Choice; pregnant women with a Medicaid card; and children who have applied for Medicaid or [place] Health Choice, but were declined, whose parents can pay a reduced fee at time of service.  °[place]  [facility] [place]  [address], [place] (336) 641-7733 Accepts children up to age 21 who are enrolled in Medicaid or [place] Health Choice; pregnant women with a Medicaid card; and children who have applied for Medicaid or [place] Health Choice, but were declined, whose parents can pay a reduced fee at time of service.  °[facility] ° [address], [place] (336) 641-4533 Patients are seen by appointment only. Walk-ins are not accepted. [facility] will see patients 18 years of age and older. °Monday - Tuesday (8am-5pm) °Most Wednesdays (8:30-5pm) °$30 per visit, cash only  °[facility] ° [address], [place] (336) 641-4533 Patients are seen by appointment only. Walk-ins are not accepted. [facility] will see patients 18 years of age and older. °One   Wednesday Evening (Monthly: Volunteer Based).  $30 per visit, cash only  °[facility]  (919) 537-3737 for adults; Children under age 4, call Graduate Pediatric Dentistry at ([phone]) [phone]. Children aged 4-14, please call (919) [phone] to request a pediatric application. ° Dental services are provided in all areas of dental care including fillings, crowns and bridges, complete and partial dentures, implants, gum treatment, root canals, and extractions. Preventive care is also provided. Treatment is provided to both adults and children. °Patients are selected via a lottery and there is often a waiting list. °  °[facility] [address], °[place] ° (336) 763-8833 [email or web address] °  °[facility] [address], [place], [place] (336)723-1848, Ext. 123 Second and Fourth Thursday of each month, opens at 6:30 AM; Clinic ends at 9 AM.  Patients are seen on a first-come first-served basis, and a limited number are seen during each clinic.  ° °[facility] ° [address], [place], [place] (336) 723-7904    Eligibility Requirements °You must have lived in [place], [place], or [place] counties for at least the last three months. °  You cannot be eligible for state or federal sponsored healthcare insurance, including Veterans Administration, Medicaid, or Medicare. °  You generally cannot be eligible for healthcare insurance through your employer.  °  How to apply: °Eligibility screenings are held every Tuesday and Wednesday afternoon from 1:00 pm until 4:00 pm. You do not need an appointment for the interview!  °[facility] [address], [place], [place] 336-631-2330   °[facility]  336-342-8273   °[facility]  336-703-3100   °[facility]  336-570-6415   ° °Behavioral Health Resources in the Community: °Intensive Outpatient Programs °Organization         Address  Phone  Notes  °[facility] [address], [place], [place] 336-878-6098   °[facility] [address], [place], [place] 336-832-9800   °[facility] [address], [place], [place] ° 336-882-2125   °[facility] [address],  °[place], [place] 1-800-853-5163 or 336-641-4981   °Substance Abuse Resources °Organization         Address  Phone  Notes  °[facility]  336-882-2125   °[facility]  336-784-9470   °[facility]  336-285-9073   °[facility]  336-845-3988   °Residential & Outpatient Substance Abuse Program  1-800-659-3381   °Psychological Services °Organization         Address  Phone  Notes  °[facility]  336- 832-9600   °[facility]  336- 378-7881   °[facility] [address], [place] 1-800-853-5163 or 336-641-4981   ° °Mobile Crisis Teams °Organization         Address  Phone  Notes  °[facility], Mobile Crisis Care Unit  1-877-626-1772   °Assertive °[facility] ° [address]  [place], [place] 336-834-9664   °Sharon DeEsch [address] °[place] 336-554-5454   ° °Self-Help/Support Groups °Organization         Address  Phone             Notes  °[facility]oc[facility] of  - variety of support groups  336- 373-1402 Call for more information  °Narcotics Anonymous (NA), [facility] [address], °[place]  2 meetings at this location  ° °  Residential Treatment Programs Organization         Address  Phone  Notes  [facility] [address],    [place]  [phone]   [facility]  [address], [place] [phone], [place], [place] [phone]   [facility] [address], [place] [phone] Admissions: 8am-3pm M-F  [facility] [address]    [place], [place] [phone]([phone]   [facility] [address], [place], [place] [phone]   [facility] [address]  [place], [place] [phone]   [facility] Intensive Outpatient [address], [place], [place] [phone]   [facility] ([facility] [address]  [place], [place] [phone] or [phone]   [facility] ([facility]) [address] [place], [place] [phone] Accepts Medicaid  [facility] [address]  [place] [phone] Substance Abuse/Addiction Treatment   [facility] Resources Organization         Address  Phone  Notes  [facility]  360-737-8388(888) [phone]   Angie FavaJulie Brannon, PhD [address], [address] [place], [place]   [phone](336) (832) [phone] or (607)[phone](336) [phone]   [facility]   [address] [place], [place] [phone](336) [phone]   [facility] [address], [place], [place] [phone](336) [phone] Insurance/Medicaid/sponsorship through [facility] [address]                                    [place], [place] [phone](336) [phone] Therapy/tele-psych/case    [facility] [address]   [place], [place] [phone](336) [phone]    Dr. Lolly MustacheArfeen  [phone](336) [phone]   [facility]  [employer] [facility] 1) [address], [place] 2) [address], [place] 3)  [address], [place] [phone](336) [phone](336) [phone]  [phone](336) [phone]   [facility] Child Abuse Hotline [phone](336) 864-113-7964 or [phone](336) [phone] (After Hours)      Use your albuterol inhaler (2 to 4 puffs) every 4 hours for the next 7 days, then as needed for cough, wheezing, or shortness of breath. Take over the counter tylenol and ibuprofen, as directed on packaging, as needed for discomfort.  Apply moist heat or ice to the area(s) of discomfort, for 15 minutes at a time, several times per day for the next few days.  Do not fall asleep on a heating or ice pack.  Call your regular medical doctor tomorrow to schedule a follow up appointment this week.  Return to the Emergency Department immediately if worsening.

## 2015-02-09 NOTE — ED Notes (Signed)
Pt c/o of increasingly worse SOB, RT ribcage pain, nausea, and cough. Pt states she was recently treated with an URI.

## 2015-02-09 NOTE — ED Notes (Signed)
Pt states understanding of care given and follow up instructions.  No further questions at this time

## 2015-05-29 ENCOUNTER — Encounter: Payer: Self-pay | Admitting: Women's Health

## 2015-05-29 ENCOUNTER — Ambulatory Visit (INDEPENDENT_AMBULATORY_CARE_PROVIDER_SITE_OTHER): Payer: BLUE CROSS/BLUE SHIELD | Admitting: Women's Health

## 2015-05-29 VITALS — BP 124/74 | HR 72 | Wt 251.5 lb

## 2015-05-29 DIAGNOSIS — N920 Excessive and frequent menstruation with regular cycle: Secondary | ICD-10-CM | POA: Diagnosis not present

## 2015-05-29 DIAGNOSIS — N898 Other specified noninflammatory disorders of vagina: Secondary | ICD-10-CM | POA: Diagnosis not present

## 2015-05-29 DIAGNOSIS — Z3202 Encounter for pregnancy test, result negative: Secondary | ICD-10-CM | POA: Diagnosis not present

## 2015-05-29 DIAGNOSIS — F3281 Premenstrual dysphoric disorder: Secondary | ICD-10-CM | POA: Insufficient documentation

## 2015-05-29 DIAGNOSIS — Z832 Family history of diseases of the blood and blood-forming organs and certain disorders involving the immune mechanism: Secondary | ICD-10-CM | POA: Insufficient documentation

## 2015-05-29 LAB — POCT WET PREP (WET MOUNT): Clue Cells Wet Prep Whiff POC: NEGATIVE

## 2015-05-29 LAB — POCT URINE PREGNANCY: Preg Test, Ur: NEGATIVE

## 2015-05-29 NOTE — Progress Notes (Addendum)
Patient ID: Julia Johnson Tagg, female   DOB: 12/13/1980, 35 y.o.   MRN: [id number]   [facility]  Patient name: Julia Johnson Umble MRN [id number]  Date of birth: 12/13/1980  CC & HPI:  Julia Johnson Hathaway is a 35 y.o. 103P2012 Caucasian female presenting today for report of heavy periods since Sept. Periods are regular, occ may be few days late, changing nearly saturated overnight pad q 1hr, some stringy clots, +cramping and bloating. Sometimes will think period is ending then gets heavy again. Periods last ~5-7d. Increased moodiness and headaches just before period starts. Not on any birth control, is sexually active ~q231mth w/ husband. Does have mittelschmerz pain so avoids sex around ovulation. Considering another pregnancy, but is seeing GI currently to r/o celiac, cut out gluten x 6374yr felt much better- is currently in middle of gluten challenge, so wants to get all of that settled first.   COCs caused increased depression in past. Mom has Factor V Leiden, pt states she's never been checked. Just recently had TSH checked w/ PCP, normal at 1.901 (05/25/15). Still breastfeeding 2120 month old. Concerned about fibroids, no known family hx, requests pelvic u/s.  Patient's last menstrual period was 05/24/2015. The current method of family planning is none. Last pap 01/14/13, neg  Pertinent History Reviewed:  Medical & Surgical Hx:   Past medical, surgical, family, and social history reviewed in electronic medical record Medications: Reviewed & Updated - see associated section Allergies: Reviewed in electronic medical record  Objective Findings:  Vitals: BP 124/74 mmHg  Pulse 72  Wt 251 lb 8 oz (114.08 kg)  LMP 05/24/2015 Body mass index is 39.38 kg/(m^2).  Physical Examination: General appearance - alert, well appearing, and in no distress Pelvic - normal external genitalia, vulva, vagina, & cervix. Uterus and adnexa difficult to palpate d/t body habitus. Normal nonodorous d/c, wet prep obtained, no  blood in vault, nothing coming from cx  Neg urine preg test today Results for orders placed or performed in visit on 05/29/15 (from the past 24 hour(s))  POCT Wet Prep Mellody Drown(Wet [place])     Status: Normal   Collection Time: 05/29/15  4:21 PM  Result Value Ref Range   Source Wet Prep POC vaginal    WBC, Wet Prep HPF POC few    Bacteria Wet Prep HPF POC None None, Few, Too numerous to count   BACTERIA WET PREP MORPHOLOGY POC     Clue Cells Wet Prep HPF POC None None, Too numerous to count   Clue Cells Wet Prep Whiff POC Negative Whiff    Yeast Wet Prep HPF POC None    KOH Wet Prep POC     Trichomonas Wet Prep HPF POC none      Assessment & Plan:  A:   Menorrhagia w/ regular cycle  PMDD  Family h/o Factor V Leiden  P:  CBC, Factor V Leiden today  Unable to void enough for gc/ct, pt/husband profess monogamous marriage  Return for this week for pelvic u/s, then 1wk for F/U.  Marge DuncansBooker, Kalman Nylen Randall CNM, [facility] 05/29/2015 4:19 PM

## 2015-06-01 LAB — CBC
HEMATOCRIT: 41 % (ref 34.0–46.6)
Hemoglobin: 14.6 g/dL (ref 11.1–15.9)
MCH: 31.2 pg (ref 26.6–33.0)
MCHC: 35.6 g/dL (ref 31.5–35.7)
MCV: 88 fL (ref 79–97)
PLATELETS: 267 10*3/uL (ref 150–379)
RBC: 4.68 x10E6/uL (ref 3.77–5.28)
RDW: 14 % (ref 12.3–15.4)
WBC: 10 10*3/uL (ref 3.4–10.8)

## 2015-06-01 LAB — FACTOR 5 LEIDEN

## 2015-06-02 ENCOUNTER — Ambulatory Visit (INDEPENDENT_AMBULATORY_CARE_PROVIDER_SITE_OTHER): Payer: BLUE CROSS/BLUE SHIELD

## 2015-06-02 DIAGNOSIS — N854 Malposition of uterus: Secondary | ICD-10-CM | POA: Diagnosis not present

## 2015-06-02 DIAGNOSIS — N920 Excessive and frequent menstruation with regular cycle: Secondary | ICD-10-CM | POA: Diagnosis not present

## 2015-06-02 NOTE — Progress Notes (Signed)
PEVLIC US TA/TV: homogenous retroverted uterus wnl,normal ov's bilat(mobile),EEC 11 mm,no free fluid seen,no pain during ultrasound

## 2015-06-05 ENCOUNTER — Telehealth: Payer: Self-pay | Admitting: Women's Health

## 2015-06-05 NOTE — Telephone Encounter (Signed)
LM for pt to return call. Need to discuss u/s and labs.  Cheral MarkerKimberly R. Thornton Dohrmann, CNM, [facility] 06/05/2015 11:05 AM

## 2015-06-05 NOTE — Telephone Encounter (Signed)
Returned pt's call, notified her of normal pelvic u/s, normal hgb 14+, Factor V Leiden mutation was negative. She should know results from GI testing/gluten allergy this week. Wants to wait until after that to get on COCs for PMDD/menorrhagia sx. States she will email me when she has results/is ready to start COCs. Does not smoke, no h/o HTN, DVT/PE, CVA, MI, or migraines w/ aura.  Cheral MarkerKimberly R. Booker, CNM, [facility] 06/05/2015 11:38 AM

## 2015-06-06 ENCOUNTER — Ambulatory Visit: Payer: BLUE CROSS/BLUE SHIELD | Admitting: Women's Health

## 2015-06-28 ENCOUNTER — Encounter: Payer: Self-pay | Admitting: Women's Health

## 2015-06-28 ENCOUNTER — Other Ambulatory Visit: Payer: Self-pay | Admitting: Women's Health

## 2015-06-28 MED ORDER — NORETHINDRONE 0.35 MG PO TABS: ORAL_TABLET | ORAL | Status: DC

## 2015-06-28 NOTE — Progress Notes (Signed)
Received my-chart e-mail from pt:       ----- Message -----     From: [name]     Sent: 06/28/2015  1:45 PM      To: [facility]    Subject: Visit Follow-Up Question                   Hi Julia Johnson,        Just wanted to let you know my endoscopy is Friday, April 7, so I will be returning gluten free after the procedure. I started my period on April 1, so the timing also works out for me to start birth control again and hopefully curtail some of these symptoms between going gluten free and taking the bc. Could you please [facility] some [facility] for [facility] that is ok for nursing? I am still nursing Julia Johnson some--we at the in between weaning stage. I hope to wean her by her birthday in June, or shortly there after. Thanks!         Julia Johnson      Rx micronor w/ 11RF sent.  Cheral MarkerKimberly R. Johnson, CNM, [facility] 06/28/2015 4:33 PM

## 2015-07-28 ENCOUNTER — Telehealth (HOSPITAL_COMMUNITY): Payer: Self-pay | Admitting: Lactation Services

## 2015-07-28 NOTE — Telephone Encounter (Signed)
Patient called about ways to wean her 4519-month old from breastfeeding. Weaning discussed at length. Technically, Mom has already begun to wean daughter by not offering breast, trying to limit time at breast, etc. I referred patient to website resource: www.kellymom.com (article "Weaning Techniques"). Mom also given anticipatory guidance that lactation cessation can bring on PPMD in women who have a hx of anxiety/depression.  Glenetta HewKim Vaun Hyndman, RN, IBCLC

## 2015-09-29 ENCOUNTER — Encounter: Payer: Self-pay | Admitting: Women's Health

## 2015-11-26 ENCOUNTER — Encounter: Payer: Self-pay | Admitting: Women's Health

## 2015-11-29 ENCOUNTER — Ambulatory Visit (INDEPENDENT_AMBULATORY_CARE_PROVIDER_SITE_OTHER): Payer: BLUE CROSS/BLUE SHIELD | Admitting: Adult Health

## 2015-11-29 ENCOUNTER — Encounter: Payer: Self-pay | Admitting: Adult Health

## 2015-11-29 VITALS — BP 110/60 | HR 84 | Ht 66.5 in | Wt 217.5 lb

## 2015-11-29 DIAGNOSIS — R110 Nausea: Secondary | ICD-10-CM

## 2015-11-29 DIAGNOSIS — Z3490 Encounter for supervision of normal pregnancy, unspecified, unspecified trimester: Secondary | ICD-10-CM

## 2015-11-29 DIAGNOSIS — N926 Irregular menstruation, unspecified: Secondary | ICD-10-CM

## 2015-11-29 DIAGNOSIS — Z3201 Encounter for pregnancy test, result positive: Secondary | ICD-10-CM

## 2015-11-29 DIAGNOSIS — O3680X0 Pregnancy with inconclusive fetal viability, not applicable or unspecified: Secondary | ICD-10-CM

## 2015-11-29 DIAGNOSIS — O09521 Supervision of elderly multigravida, first trimester: Secondary | ICD-10-CM

## 2015-11-29 LAB — POCT URINE PREGNANCY: PREG TEST UR: POSITIVE — AB

## 2015-11-29 NOTE — Patient Instructions (Signed)
First Trimester of Pregnancy The first trimester of pregnancy is from week 1 until the end of week 12 (months 1 through 3). A week after a sperm fertilizes an egg, the egg will implant on the wall of the uterus. This embryo will begin to develop into a baby. Genes from you and your partner are forming the baby. The female genes determine whether the baby is a boy or a girl. At 6-8 weeks, the eyes and face are formed, and the heartbeat can be seen on ultrasound. At the end of 12 weeks, all the baby's organs are formed.  Now that you are pregnant, you will want to do everything you can to have a healthy baby. Two of the most important things are to get good prenatal care and to follow your health care provider's instructions. Prenatal care is all the medical care you receive before the baby's birth. This care will help prevent, find, and treat any problems during the pregnancy and childbirth. BODY CHANGES Your body goes through many changes during pregnancy. The changes vary from woman to woman.   You may gain or lose a couple of pounds at first.  You may feel sick to your stomach (nauseous) and throw up (vomit). If the vomiting is uncontrollable, call your health care provider.  You may tire easily.  You may develop headaches that can be relieved by medicines approved by your health care provider.  You may urinate more often. Painful urination may mean you have a bladder infection.  You may develop heartburn as a result of your pregnancy.  You may develop constipation because certain hormones are causing the muscles that push waste through your intestines to slow down.  You may develop hemorrhoids or swollen, bulging veins (varicose veins).  Your breasts may begin to grow larger and become tender. Your nipples may stick out more, and the tissue that surrounds them (areola) may become darker.  Your gums may bleed and may be sensitive to brushing and flossing.  Dark spots or blotches (chloasma,  mask of pregnancy) may develop on your face. This will likely fade after the baby is born.  Your menstrual periods will stop.  You may have a loss of appetite.  You may develop cravings for certain kinds of food.  You may have changes in your emotions from day to day, such as being excited to be pregnant or being concerned that something may go wrong with the pregnancy and baby.  You may have more vivid and strange dreams.  You may have changes in your hair. These can include thickening of your hair, rapid growth, and changes in texture. Some women also have hair loss during or after pregnancy, or hair that feels dry or thin. Your hair will most likely return to normal after your baby is born. WHAT TO EXPECT AT YOUR PRENATAL VISITS During a routine prenatal visit:  You will be weighed to make sure you and the baby are growing normally.  Your blood pressure will be taken.  Your abdomen will be measured to track your baby's growth.  The fetal heartbeat will be listened to starting around week 10 or 12 of your pregnancy.  Test results from any previous visits will be discussed. Your health care provider may ask you:  How you are feeling.  If you are feeling the baby move.  If you have had any abnormal symptoms, such as leaking fluid, bleeding, severe headaches, or abdominal cramping.  If you are using any tobacco products,   including cigarettes, chewing tobacco, and electronic cigarettes.  If you have any questions. Other tests that may be performed during your first trimester include:  Blood tests to find your blood type and to check for the presence of any previous infections. They will also be used to check for low iron levels (anemia) and Rh antibodies. Later in the pregnancy, blood tests for diabetes will be done along with other tests if problems develop.  Urine tests to check for infections, diabetes, or protein in the urine.  An ultrasound to confirm the proper growth  and development of the baby.  An amniocentesis to check for possible genetic problems.  Fetal screens for spina bifida and Down syndrome.  You may need other tests to make sure you and the baby are doing well.  HIV (human immunodeficiency virus) testing. Routine prenatal testing includes screening for HIV, unless you choose not to have this test. HOME CARE INSTRUCTIONS  Medicines  Follow your health care provider's instructions regarding medicine use. Specific medicines may be either safe or unsafe to take during pregnancy.  Take your prenatal vitamins as directed.  If you develop constipation, try taking a stool softener if your health care provider approves. Diet  Eat regular, well-balanced meals. Choose a variety of foods, such as meat or vegetable-based protein, fish, milk and low-fat dairy products, vegetables, fruits, and whole grain breads and cereals. Your health care provider will help you determine the amount of weight gain that is right for you.  Avoid raw meat and uncooked cheese. These carry germs that can cause birth defects in the baby.  Eating four or five small meals rather than three large meals a day may help relieve nausea and vomiting. If you start to feel nauseous, eating a few soda crackers can be helpful. Drinking liquids between meals instead of during meals also seems to help nausea and vomiting.  If you develop constipation, eat more high-fiber foods, such as fresh vegetables or fruit and whole grains. Drink enough fluids to keep your urine clear or pale yellow. Activity and Exercise  Exercise only as directed by your health care provider. Exercising will help you:  Control your weight.  Stay in shape.  Be prepared for labor and delivery.  Experiencing pain or cramping in the lower abdomen or low back is a good sign that you should stop exercising. Check with your health care provider before continuing normal exercises.  Try to avoid standing for long  periods of time. Move your legs often if you must stand in one place for a long time.  Avoid heavy lifting.  Wear low-heeled shoes, and practice good posture.  You may continue to have sex unless your health care provider directs you otherwise. Relief of Pain or Discomfort  Wear a good support bra for breast tenderness.   Take warm sitz baths to soothe any pain or discomfort caused by hemorrhoids. Use hemorrhoid cream if your health care provider approves.   Rest with your legs elevated if you have leg cramps or low back pain.  If you develop varicose veins in your legs, wear support hose. Elevate your feet for 15 minutes, 3-4 times a day. Limit salt in your diet. Prenatal Care  Schedule your prenatal visits by the twelfth week of pregnancy. They are usually scheduled monthly at first, then more often in the last 2 months before delivery.  Write down your questions. Take them to your prenatal visits.  Keep all your prenatal visits as directed by your   health care provider. Safety  Wear your seat belt at all times when driving.  Make a list of emergency phone numbers, including numbers for family, friends, the hospital, and police and fire departments. General Tips  Ask your health care provider for a referral to a local prenatal education class. Begin classes no later than at the beginning of month 6 of your pregnancy.  Ask for help if you have counseling or nutritional needs during pregnancy. Your health care provider can offer advice or refer you to specialists for help with various needs.  Do not use hot tubs, steam rooms, or saunas.  Do not douche or use tampons or scented sanitary pads.  Do not cross your legs for long periods of time.  Avoid cat litter boxes and soil used by cats. These carry germs that can cause birth defects in the baby and possibly loss of the fetus by miscarriage or stillbirth.  Avoid all smoking, herbs, alcohol, and medicines not prescribed by  your health care provider. Chemicals in these affect the formation and growth of the baby.  Do not use any tobacco products, including cigarettes, chewing tobacco, and electronic cigarettes. If you need help quitting, ask your health care provider. You may receive counseling support and other resources to help you quit.  Schedule a dentist appointment. At home, brush your teeth with a soft toothbrush and be gentle when you floss. SEEK MEDICAL CARE IF:   You have dizziness.  You have mild pelvic cramps, pelvic pressure, or nagging pain in the abdominal area.  You have persistent nausea, vomiting, or diarrhea.  You have a bad smelling vaginal discharge.  You have pain with urination.  You notice increased swelling in your face, hands, legs, or ankles. SEEK IMMEDIATE MEDICAL CARE IF:   You have a fever.  You are leaking fluid from your vagina.  You have spotting or bleeding from your vagina.  You have severe abdominal cramping or pain.  You have rapid weight gain or loss.  You vomit blood or material that looks like coffee grounds.  You are exposed to [place] measles and have never had them.  You are exposed to fifth disease or chickenpox.  You develop a severe headache.  You have shortness of breath.  You have any kind of trauma, such as from a fall or a car accident.   This information is not intended to replace advice given to you by your health care provider. Make sure you discuss any questions you have with your health care provider.   Document Released: 03/05/2001 Document Revised: 04/01/2014 Document Reviewed: 01/19/2013 Elsevier Interactive Patient Education [employer]2016 Elsevier Inc. Return 9/15 for dating [place]

## 2015-11-29 NOTE — Progress Notes (Signed)
Subjective:     Patient ID: Julia Johnson, female   DOB: Jul 14, 1980, 35 y.o.   MRN: [id number]  HPI Julia Johnson is a 35 year old white female, married in for a UPT, she has had 2+HPT.She has 35 yo that she is still breastfeeding some, in am and pm ,but is weaning and she has been diagnosed with Celiac's and has lost 35 lbs on her new diet. Some nausea.  Review of Systems +missed period +nausea Reviewed past medical,surgical, social and family history. Reviewed medications and allergies.     Objective:   Physical Exam BP 110/60 (BP Location: Left Arm, Patient Position: Sitting, Cuff Size: Large)   Pulse 84   Ht 5' 6.5" (1.689 m)   Wt 217 lb 8 oz (98.7 kg)   LMP 10/18/2015   Breastfeeding? Yes   BMI 34.58 kg/m    UPT +,about 5+1 week, by LMP with EDD 07/24/16, Skin warm and dry. Neck: mid line trachea, normal thyroid, good ROM, no lymphadenopathy noted. Lungs: clear to ausculation bilaterally. Cardiovascular: regular rate and rhythm.Abdomen is soft and non tender.  Assessment:     Pregnancy examination or test, positive result - Plan: POCT urine pregnancy  Pregnant  AMA (advanced maternal age) multigravida 35+, first trimester  Pregnancy with inconclusive fetal viability, not applicable or unspecified fetus - Plan: US OB Comp Less 14 Wks     Plan:     Return 9/15 for dating US Review handout on first trimester

## 2015-12-08 ENCOUNTER — Other Ambulatory Visit: Payer: Self-pay | Admitting: Adult Health

## 2015-12-08 ENCOUNTER — Ambulatory Visit (INDEPENDENT_AMBULATORY_CARE_PROVIDER_SITE_OTHER): Payer: BLUE CROSS/BLUE SHIELD

## 2015-12-08 DIAGNOSIS — O3680X0 Pregnancy with inconclusive fetal viability, not applicable or unspecified: Secondary | ICD-10-CM

## 2015-12-08 DIAGNOSIS — Z3A01 Less than 8 weeks gestation of pregnancy: Secondary | ICD-10-CM | POA: Diagnosis not present

## 2015-12-08 NOTE — Progress Notes (Signed)
US 6+3 wks,single IUP w/ys,pos fht 122 bpm,normal ov's bilat,crl 7.0 mm

## 2015-12-19 ENCOUNTER — Encounter: Payer: BLUE CROSS/BLUE SHIELD | Admitting: Women's Health

## 2015-12-20 ENCOUNTER — Encounter: Payer: Self-pay | Admitting: Women's Health

## 2015-12-20 ENCOUNTER — Ambulatory Visit (INDEPENDENT_AMBULATORY_CARE_PROVIDER_SITE_OTHER): Payer: BLUE CROSS/BLUE SHIELD | Admitting: Women's Health

## 2015-12-20 VITALS — BP 100/56 | HR 80 | Wt 219.0 lb

## 2015-12-20 DIAGNOSIS — Z3491 Encounter for supervision of normal pregnancy, unspecified, first trimester: Secondary | ICD-10-CM

## 2015-12-20 DIAGNOSIS — Z369 Encounter for antenatal screening, unspecified: Secondary | ICD-10-CM

## 2015-12-20 DIAGNOSIS — Z0283 Encounter for blood-alcohol and blood-drug test: Secondary | ICD-10-CM

## 2015-12-20 DIAGNOSIS — Z1389 Encounter for screening for other disorder: Secondary | ICD-10-CM

## 2015-12-20 DIAGNOSIS — O09529 Supervision of elderly multigravida, unspecified trimester: Secondary | ICD-10-CM | POA: Insufficient documentation

## 2015-12-20 DIAGNOSIS — O09521 Supervision of elderly multigravida, first trimester: Secondary | ICD-10-CM

## 2015-12-20 DIAGNOSIS — Z331 Pregnant state, incidental: Secondary | ICD-10-CM

## 2015-12-20 DIAGNOSIS — O0990 Supervision of high risk pregnancy, unspecified, unspecified trimester: Secondary | ICD-10-CM | POA: Insufficient documentation

## 2015-12-20 DIAGNOSIS — Z3682 Encounter for antenatal screening for nuchal translucency: Secondary | ICD-10-CM

## 2015-12-20 NOTE — Patient Instructions (Addendum)

## 2015-12-20 NOTE — Progress Notes (Signed)
Subjective:  Julia Johnson is a 35 y.o. 634P2012 Caucasian female at 8135w1d by 6wk u/s, being seen today for her first obstetrical visit.  Her obstetrical history is significant for term svb x 2, IOL w/ 2nd baby for GHTN- baby was 9lb11.7, sab x 1, recently dx w/ celiac disease and is on a gluten-free diet and doing much better- depression/anxiety have improved, has lost weight. Saw Dr. Loreen FreudSafta her GI MD yest, sent her w/ labs to get drawn w/ PN1, pt also requests lipid profile b/c she has h/o high cholesterol.  Pregnancy history fully reviewed.  Patient reports no complaints. Denies vb, cramping, uti s/s, abnormal/malodorous vag d/c, or vulvovaginal itching/irritation.  BP (!) 100/56   Pulse 80   Wt 219 lb (99.3 kg)   LMP 10/18/2015 (Approximate)   BMI 34.82 kg/m   HISTORY: OB History  Gravida Para Term Preterm AB Living  4 2 2   1 2   SAB TAB Ectopic Multiple Live Births  1       2    # Outcome Date GA Lbr Len/2nd Weight Sex Delivery Anes PTL Lv  4 Current           3 Term 09/14/13 2249w0d  9 lb 11.7 oz (4.415 kg) F Vag-Spont None N LIV  2 SAB 05/10/12          1 Term 05/09/06 777w0d  8 lb 7 oz (3.827 kg) F Vag-Spont None N LIV     Birth Comments: BPs had started to elevate, but no pre-e     Past Medical History:  Diagnosis Date  . ADHD (attention deficit hyperactivity disorder)   . Anxiety   . Carpal tunnel syndrome   . Celiac disease   . Depression   . Hyperlipidemia   . Obesity   . Pregnancy induced hypertension   . UTI (urinary tract infection)    Past Surgical History:  Procedure Laterality Date  . MOUTH SURGERY    . SKIN BIOPSY  2012   Family History  Problem Relation Age of Onset  . Cancer Mother     breast  . Diabetes Mother   . Factor V Leiden deficiency Mother   . Heart failure Mother   . Heart disease Mother   . Hypertension Father   . Diabetes Father   . Aneurysm Father   . Kidney disease Father     on dialysis  . Diabetes Maternal Grandmother   .  Congestive Heart Failure Maternal Grandmother   . Celiac disease Daughter   . Diabetes Paternal Grandfather   . Congestive Heart Failure Paternal Grandfather   . Other Daughter     Celiac gene    Exam   System:     General: Well developed & nourished, no acute distress   Skin: Warm & dry, normal coloration and turgor, no rashes   Neurologic: Alert & oriented, normal mood   Cardiovascular: Regular rate & rhythm   Respiratory: Effort & rate normal, LCTAB, acyanotic   Abdomen: Soft, non tender   Extremities: normal strength, tone   Pelvic Exam:    Perineum: Normal perineum   Vulva: Normal, no lesions   Vagina:  Normal mucosa, normal discharge   Cervix: Normal, bulbous, appears closed   Uterus: Normal size/shape/contour for GA   Thin prep pap smear will need in Oct  FHR: + via informal transabdominal u/s   Assessment:   Pregnancy: [id number] Patient Active Problem List   Diagnosis Date Noted  . Supervision of  normal pregnancy 12/20/2015    Priority: High  . History of gestational hypertension 10/11/2013    Priority: High  . Family history of breast cancer in first degree relative 04/27/2013    Priority: High  . Depression with anxiety 02/23/2013    Priority: High  . AMA (advanced maternal age) multigravida 35+ 12/20/2015  . Menorrhagia with regular cycle 05/29/2015  . Family history of factor V Leiden mutation 05/29/2015  . PMDD (premenstrual dysphoric disorder) 05/29/2015    [redacted]w[redacted]d [id number] New OB visit Celiac dz AMA H/O GHTN  Plan:  Will get PN1 on next Mon or Tues, owes [facility] $, and unable to decipher one of labs GI MD requesting- so have called the office and waiting to here back Continue prenatal vitamins Problem list reviewed and updated Reviewed n/v relief measures and warning s/s to report Reviewed recommended weight gain based on pre-gravid BMI Encouraged well-balanced diet Genetic Screening discussed Integrated Screen: requested Cystic fibrosis  screening discussed declined Ultrasound discussed; fetal survey: requested Follow up in Mon or Tues for labs, then 4 weeks for 1st IT/NT and visit [facility] completed Declines flu shot  Marge Duncans CNM, [facility] 12/20/2015 4:39 PM

## 2015-12-20 NOTE — Addendum Note (Signed)
Addended by: Gaylyn RongEVANS, [place] A on: 12/20/2015 05:20 PM   Modules accepted: Orders

## 2015-12-21 ENCOUNTER — Other Ambulatory Visit: Payer: Self-pay | Admitting: Women's Health

## 2015-12-21 DIAGNOSIS — Z331 Pregnant state, incidental: Secondary | ICD-10-CM

## 2015-12-21 DIAGNOSIS — Z8639 Personal history of other endocrine, nutritional and metabolic disease: Secondary | ICD-10-CM

## 2015-12-21 DIAGNOSIS — Z3682 Encounter for antenatal screening for nuchal translucency: Secondary | ICD-10-CM

## 2015-12-21 DIAGNOSIS — K900 Celiac disease: Secondary | ICD-10-CM

## 2015-12-21 LAB — URINALYSIS, ROUTINE W REFLEX MICROSCOPIC
Bilirubin, UA: NEGATIVE
GLUCOSE, UA: NEGATIVE
Ketones, UA: NEGATIVE
NITRITE UA: NEGATIVE
PROTEIN UA: NEGATIVE
RBC, UA: NEGATIVE
Specific Gravity, UA: 1.02 (ref 1.005–1.030)
Urobilinogen, Ur: 1 mg/dL (ref 0.2–1.0)
pH, UA: 6.5 (ref 5.0–7.5)

## 2015-12-21 LAB — PMP SCREEN PROFILE (10S), URINE
AMPHETAMINE SCRN UR: NEGATIVE ng/mL
Barbiturate Screen, Ur: NEGATIVE ng/mL
Benzodiazepine Screen, Urine: NEGATIVE ng/mL
COCAINE(METAB.) SCREEN, URINE: NEGATIVE ng/mL
Cannabinoids Ur Ql Scn: NEGATIVE ng/mL
Creatinine(Crt), U: 93.6 mg/dL (ref 20.0–300.0)
METHADONE SCREEN, URINE: NEGATIVE ng/mL
OPIATE SCRN UR: NEGATIVE ng/mL
OXYCODONE+OXYMORPHONE UR QL SCN: NEGATIVE ng/mL
PCP SCRN UR: NEGATIVE ng/mL
PROPOXYPHENE SCREEN: NEGATIVE ng/mL
Ph of Urine: 6.1 (ref 4.5–8.9)

## 2015-12-21 LAB — MICROSCOPIC EXAMINATION: Casts: NONE SEEN /lpf

## 2015-12-22 LAB — URINE CULTURE

## 2015-12-22 LAB — GC/CHLAMYDIA PROBE AMP
CHLAMYDIA, DNA PROBE: NEGATIVE
NEISSERIA GONORRHOEAE BY PCR: NEGATIVE

## 2015-12-25 ENCOUNTER — Other Ambulatory Visit: Payer: Self-pay | Admitting: Women's Health

## 2015-12-25 ENCOUNTER — Other Ambulatory Visit: Payer: BLUE CROSS/BLUE SHIELD

## 2015-12-26 LAB — ABO/RH: Rh Factor: POSITIVE

## 2015-12-26 LAB — HEPATITIS B SURFACE ANTIGEN: HEP B S AG: NEGATIVE

## 2015-12-26 LAB — CBC
HEMOGLOBIN: 13.3 g/dL (ref 11.1–15.9)
Hematocrit: 39 % (ref 34.0–46.6)
MCH: 30 pg (ref 26.6–33.0)
MCHC: 34.1 g/dL (ref 31.5–35.7)
MCV: 88 fL (ref 79–97)
Platelets: 164 10*3/uL (ref 150–379)
RBC: 4.43 x10E6/uL (ref 3.77–5.28)
RDW: 12.8 % (ref 12.3–15.4)
WBC: 7.4 10*3/uL (ref 3.4–10.8)

## 2015-12-26 LAB — RPR: RPR: NONREACTIVE

## 2015-12-26 LAB — HIV ANTIBODY (ROUTINE TESTING W REFLEX): HIV Screen 4th Generation wRfx: NONREACTIVE

## 2015-12-26 LAB — ANTIBODY SCREEN: Antibody Screen: NEGATIVE

## 2015-12-26 LAB — VARICELLA ZOSTER ANTIBODY, IGG: Varicella zoster IgG: 428 index (ref >165)

## 2015-12-26 LAB — RUBELLA SCREEN: Rubella Antibodies, IGG: 1.05 index (ref >0.99)

## 2015-12-28 LAB — FOLATE RBC
FOLATE, HEMOLYSATE: 558.9 ng/mL
FOLATE, RBC: 1440 ng/mL (ref >498)
Hematocrit: 38.8 % (ref 34.0–46.6)

## 2015-12-28 LAB — IRON AND TIBC
Iron Saturation: 30 % (ref 15–55)
Iron: 91 ug/dL (ref 27–159)
TIBC: 305 ug/dL (ref 250–450)
UIBC: 214 ug/dL (ref 131–425)

## 2015-12-28 LAB — COMPREHENSIVE METABOLIC PANEL
A/G RATIO: 1.7 (ref 1.2–2.2)
ALK PHOS: 68 IU/L (ref 39–117)
ALT: 19 IU/L (ref 0–32)
AST: 14 IU/L (ref 0–40)
Albumin: 4.1 g/dL (ref 3.5–5.5)
BILIRUBIN TOTAL: 0.9 mg/dL (ref 0.0–1.2)
BUN/Creatinine Ratio: 10 (ref 9–23)
BUN: 7 mg/dL (ref 6–20)
CHLORIDE: 101 mmol/L (ref 96–106)
CO2: 21 mmol/L (ref 18–29)
Calcium: 9.2 mg/dL (ref 8.7–10.2)
Creatinine, Ser: 0.73 mg/dL (ref 0.57–1.00)
GFR calc non Af Amer: 107 mL/min/{1.73_m2} (ref >59)
GFR, EST AFRICAN AMERICAN: 123 mL/min/{1.73_m2} (ref >59)
GLUCOSE: 92 mg/dL (ref 65–99)
Globulin, Total: 2.4 g/dL (ref 1.5–4.5)
POTASSIUM: 4.3 mmol/L (ref 3.5–5.2)
Sodium: 140 mmol/L (ref 134–144)
TOTAL PROTEIN: 6.5 g/dL (ref 6.0–8.5)

## 2015-12-28 LAB — VITAMIN D 25 HYDROXY (VIT D DEFICIENCY, FRACTURES): Vit D, 25-Hydroxy: 28.4 ng/mL — ABNORMAL LOW (ref 30.0–100.0)

## 2015-12-28 LAB — LIPID PANEL
CHOLESTEROL TOTAL: 212 mg/dL — AB (ref 100–199)
Chol/HDL Ratio: 4.9 ratio units — ABNORMAL HIGH (ref 0.0–4.4)
HDL: 43 mg/dL (ref >39)
LDL Calculated: 124 mg/dL — ABNORMAL HIGH (ref 0–99)
TRIGLYCERIDES: 224 mg/dL — AB (ref 0–149)
VLDL CHOLESTEROL CAL: 45 mg/dL — AB (ref 5–40)

## 2015-12-28 LAB — GLIADIN IGA+TTG IGA
Antigliadin Abs, IgA: 7 units (ref 0–19)
TRANSGLUTAMINASE IGA: 2 U/mL (ref 0–3)

## 2015-12-28 LAB — TRANSFERRIN: TRANSFERRIN: 268 mg/dL (ref 200–370)

## 2015-12-28 LAB — VITAMIN B12: Vitamin B-12: 431 pg/mL (ref 211–946)

## 2015-12-28 LAB — T4, FREE: Free T4: 0.91 ng/dL (ref 0.82–1.77)

## 2015-12-28 LAB — ZINC: ZINC: 83 ug/dL (ref 56–134)

## 2015-12-28 LAB — TSH: TSH: 1.61 u[IU]/mL (ref 0.450–4.500)

## 2016-01-05 ENCOUNTER — Encounter: Payer: Self-pay | Admitting: Women's Health

## 2016-01-05 DIAGNOSIS — E7800 Pure hypercholesterolemia, unspecified: Secondary | ICD-10-CM | POA: Insufficient documentation

## 2016-01-05 DIAGNOSIS — E785 Hyperlipidemia, unspecified: Secondary | ICD-10-CM | POA: Insufficient documentation

## 2016-01-17 ENCOUNTER — Ambulatory Visit (INDEPENDENT_AMBULATORY_CARE_PROVIDER_SITE_OTHER): Payer: BLUE CROSS/BLUE SHIELD

## 2016-01-17 ENCOUNTER — Other Ambulatory Visit (HOSPITAL_COMMUNITY)
Admission: RE | Admit: 2016-01-17 | Discharge: 2016-01-17 | Disposition: A | Discharge status: Home / Self Care | Payer: BLUE CROSS/BLUE SHIELD | Source: Ambulatory Visit | Attending: Advanced Practice Midwife | Admitting: Advanced Practice Midwife

## 2016-01-17 ENCOUNTER — Ambulatory Visit (INDEPENDENT_AMBULATORY_CARE_PROVIDER_SITE_OTHER): Payer: BLUE CROSS/BLUE SHIELD | Admitting: Advanced Practice Midwife

## 2016-01-17 ENCOUNTER — Encounter: Payer: Self-pay | Admitting: Advanced Practice Midwife

## 2016-01-17 VITALS — BP 120/62 | HR 100 | Wt 220.0 lb

## 2016-01-17 DIAGNOSIS — Z331 Pregnant state, incidental: Secondary | ICD-10-CM

## 2016-01-17 DIAGNOSIS — Z3A13 13 weeks gestation of pregnancy: Secondary | ICD-10-CM

## 2016-01-17 DIAGNOSIS — O09521 Supervision of elderly multigravida, first trimester: Secondary | ICD-10-CM

## 2016-01-17 DIAGNOSIS — Z3A12 12 weeks gestation of pregnancy: Secondary | ICD-10-CM | POA: Diagnosis not present

## 2016-01-17 DIAGNOSIS — Z3481 Encounter for supervision of other normal pregnancy, first trimester: Secondary | ICD-10-CM

## 2016-01-17 DIAGNOSIS — Z1389 Encounter for screening for other disorder: Secondary | ICD-10-CM

## 2016-01-17 DIAGNOSIS — Z3401 Encounter for supervision of normal first pregnancy, first trimester: Secondary | ICD-10-CM

## 2016-01-17 DIAGNOSIS — Z1151 Encounter for screening for human papillomavirus (HPV): Secondary | ICD-10-CM | POA: Insufficient documentation

## 2016-01-17 DIAGNOSIS — Z124 Encounter for screening for malignant neoplasm of cervix: Secondary | ICD-10-CM

## 2016-01-17 DIAGNOSIS — Z3682 Encounter for antenatal screening for nuchal translucency: Secondary | ICD-10-CM | POA: Diagnosis not present

## 2016-01-17 DIAGNOSIS — Z01419 Encounter for gynecological examination (general) (routine) without abnormal findings: Secondary | ICD-10-CM | POA: Insufficient documentation

## 2016-01-17 DIAGNOSIS — K900 Celiac disease: Secondary | ICD-10-CM

## 2016-01-17 LAB — POCT URINALYSIS DIPSTICK
GLUCOSE UA: NEGATIVE
Ketones, UA: NEGATIVE
Nitrite, UA: NEGATIVE
RBC UA: NEGATIVE

## 2016-01-17 NOTE — Progress Notes (Signed)
[id number] 5197w1d Estimated Date of Delivery: 07/30/16  Blood pressure 120/62, pulse 100, weight 220 lb (99.8 kg), last menstrual period 10/18/2015, currently breastfeeding.   BP weight and urine results all reviewed and noted.  Please refer to the obstetrical flow sheet for the fundal height and fetal heart rate documentation:   US 12+1 wks,measurements c/w dates,normal ov's bilat,NB present,NT 1.5 mm,crl 56.9 mm,fhr 170 bpm,post pl gr 0  denies any bleeding and no rupture of membranes symptoms or regular contractions. Patient is without complaints.  Pap smear collected. Doing well on gluten free diet.  All questions were answered.  Orders Placed This Encounter  Procedures  . Maternal Screen, Integrated #1  . POCT urinalysis dipstick    Plan:  Continued routine obstetrical care, start ASA 81mg  (moderate risk for GHTN)  Return in about 4 weeks (around 02/14/2016) for LROB.

## 2016-01-17 NOTE — Progress Notes (Addendum)
US 12+1 wks,measurements c/w dates,normal ov's bilat,NB present,NT 1.5 mm,crl 56.9 mm,fhr 170 bpm,post pl gr 0

## 2016-01-19 LAB — CYTOLOGY - PAP
Diagnosis: NEGATIVE
HPV: NOT DETECTED

## 2016-01-20 LAB — MATERNAL SCREEN, INTEGRATED #1
CROWN RUMP LENGTH MAT SCREEN: 56.9 mm
GEST. AGE ON COLLECTION DATE: 12.3 wk
Maternal Age at EDD: 36.2 years
Nuchal Translucency (NT): 1.5 mm
Number of Fetuses: 1
PAPP-A Value: 228.2 ng/mL
WEIGHT: 220 [lb_av]

## 2016-02-14 ENCOUNTER — Encounter: Payer: Self-pay | Admitting: Advanced Practice Midwife

## 2016-02-14 ENCOUNTER — Ambulatory Visit (INDEPENDENT_AMBULATORY_CARE_PROVIDER_SITE_OTHER): Payer: BLUE CROSS/BLUE SHIELD | Admitting: Advanced Practice Midwife

## 2016-02-14 VITALS — BP 120/62 | HR 98 | Wt 222.2 lb

## 2016-02-14 DIAGNOSIS — Z3A16 16 weeks gestation of pregnancy: Secondary | ICD-10-CM

## 2016-02-14 DIAGNOSIS — Z363 Encounter for antenatal screening for malformations: Secondary | ICD-10-CM

## 2016-02-14 DIAGNOSIS — Z8759 Personal history of other complications of pregnancy, childbirth and the puerperium: Secondary | ICD-10-CM

## 2016-02-14 DIAGNOSIS — Z331 Pregnant state, incidental: Secondary | ICD-10-CM

## 2016-02-14 DIAGNOSIS — Z1389 Encounter for screening for other disorder: Secondary | ICD-10-CM

## 2016-02-14 DIAGNOSIS — Z3682 Encounter for antenatal screening for nuchal translucency: Secondary | ICD-10-CM

## 2016-02-14 DIAGNOSIS — O09522 Supervision of elderly multigravida, second trimester: Secondary | ICD-10-CM

## 2016-02-14 DIAGNOSIS — Z3482 Encounter for supervision of other normal pregnancy, second trimester: Secondary | ICD-10-CM

## 2016-02-14 LAB — POCT URINALYSIS DIPSTICK
Blood, UA: NEGATIVE
Glucose, UA: NEGATIVE
NITRITE UA: NEGATIVE
Protein, UA: NEGATIVE

## 2016-02-14 MED ORDER — ASPIRIN EC 81 MG PO TBEC: 81.0000 mg | DELAYED_RELEASE_TABLET | Freq: Every day | ORAL | 6 refills | Status: DC

## 2016-02-14 MED ORDER — ASPIRIN 81 MG PO CHEW: 81.0000 mg | CHEWABLE_TABLET | Freq: Every day | ORAL | 6 refills | Status: DC

## 2016-02-14 NOTE — Patient Instructions (Signed)
Second Trimester of Pregnancy The second trimester is from week 13 through week 28 (months 4 through 6). The second trimester is often a time when you feel your best. Your body has also adjusted to being pregnant, and you begin to feel better physically. Usually, morning sickness has lessened or quit completely, you may have more energy, and you may have an increase in appetite. The second trimester is also a time when the fetus is growing rapidly. At the end of the sixth month, the fetus is about 9 inches long and weighs about 1 pounds. You will likely begin to feel the baby move (quickening) between 18 and 20 weeks of the pregnancy. Body changes during your second trimester Your body continues to go through many changes during your second trimester. The changes vary from woman to woman.  Your weight will continue to increase. You will notice your lower abdomen bulging out.  You may begin to get stretch marks on your hips, abdomen, and breasts.  You may develop headaches that can be relieved by medicines. The medicines should be approved by your health care provider.  You may urinate more often because the fetus is pressing on your bladder.  You may develop or continue to have heartburn as a result of your pregnancy.  You may develop constipation because certain hormones are causing the muscles that push waste through your intestines to slow down.  You may develop hemorrhoids or swollen, bulging veins (varicose veins).  You may have back pain. This is caused by:  Weight gain.  Pregnancy hormones that are relaxing the joints in your pelvis.  A shift in weight and the muscles that support your balance.  Your breasts will continue to grow and they will continue to become tender.  Your gums may bleed and may be sensitive to brushing and flossing.  Dark spots or blotches (chloasma, mask of pregnancy) may develop on your face. This will likely fade after the baby is born.  A dark line  from your belly button to the pubic area (linea nigra) may appear. This will likely fade after the baby is born.  You may have changes in your hair. These can include thickening of your hair, rapid growth, and changes in texture. Some women also have hair loss during or after pregnancy, or hair that feels dry or thin. Your hair will most likely return to normal after your baby is born. What to expect at prenatal visits During a routine prenatal visit:  You will be weighed to make sure you and the fetus are growing normally.  Your blood pressure will be taken.  Your abdomen will be measured to track your baby's growth.  The fetal heartbeat will be listened to.  Any test results from the previous visit will be discussed. Your health care provider may ask you:  How you are feeling.  If you are feeling the baby move.  If you have had any abnormal symptoms, such as leaking fluid, bleeding, severe headaches, or abdominal cramping.  If you are using any tobacco products, including cigarettes, chewing tobacco, and electronic cigarettes.  If you have any questions. Other tests that may be performed during your second trimester include:  Blood tests that check for:  Low iron levels (anemia).  Gestational diabetes (between 24 and 28 weeks).  Rh antibodies. This is to check for a protein on red blood cells (Rh factor).  Urine tests to check for infections, diabetes, or protein in the urine.  An ultrasound to   confirm the proper growth and development of the baby.  An amniocentesis to check for possible genetic problems.  Fetal screens for spina bifida and Down syndrome.  HIV (human immunodeficiency virus) testing. Routine prenatal testing includes screening for HIV, unless you choose not to have this test. Follow these instructions at home: Eating and drinking  Continue to eat regular, healthy meals.  Avoid raw meat, uncooked cheese, cat litter boxes, and soil used by cats. These  carry germs that can cause birth defects in the baby.  Take your prenatal vitamins.  Take 1500-2000 mg of calcium daily starting at the 20th week of pregnancy until you deliver your baby.  If you develop constipation:  Take over-the-counter or prescription medicines.  Drink enough fluid to keep your urine clear or pale yellow.  Eat foods that are high in fiber, such as fresh fruits and vegetables, whole grains, and beans.  Limit foods that are high in fat and processed sugars, such as fried and sweet foods. Activity  Exercise only as directed by your health care provider. Experiencing uterine cramps is a good sign to stop exercising.  Avoid heavy lifting, wear low heel shoes, and practice good posture.  Wear your seat belt at all times when driving.  Rest with your legs elevated if you have leg cramps or low back pain.  Wear a good support bra for breast tenderness.  Do not use hot tubs, steam rooms, or saunas. Lifestyle  Avoid all smoking, herbs, alcohol, and unprescribed drugs. These chemicals affect the formation and growth of the baby.  Do not use any products that contain nicotine or tobacco, such as cigarettes and e-cigarettes. If you need help quitting, ask your health care provider.  A sexual relationship may be continued unless your health care provider directs you otherwise. General instructions  Follow your health care provider's instructions regarding medicine use. There are medicines that are either safe or unsafe to take during pregnancy.  Take warm sitz baths to soothe any pain or discomfort caused by hemorrhoids. Use hemorrhoid cream if your health care provider approves.  If you develop varicose veins, wear support hose. Elevate your feet for 15 minutes, 3-4 times a day. Limit salt in your diet.  Visit your dentist if you have not gone yet during your pregnancy. Use a soft toothbrush to brush your teeth and be gentle when you floss.  Keep all follow-up  prenatal visits as told by your health care provider. This is important. Contact a health care provider if:  You have dizziness.  You have mild pelvic cramps, pelvic pressure, or nagging pain in the abdominal area.  You have persistent nausea, vomiting, or diarrhea.  You have a bad smelling vaginal discharge.  You have pain with urination. Get help right away if:  You have a fever.  You are leaking fluid from your vagina.  You have spotting or bleeding from your vagina.  You have severe abdominal cramping or pain.  You have rapid weight gain or weight loss.  You have shortness of breath with chest pain.  You notice sudden or extreme swelling of your face, hands, ankles, feet, or legs.  You have not felt your baby move in over an hour.  You have severe headaches that do not go away with medicine.  You have vision changes. Summary  The second trimester is from week 13 through week 28 (months 4 through 6). It is also a time when the fetus is growing rapidly.  Your body goes   through many changes during pregnancy. The changes vary from woman to woman.  Avoid all smoking, herbs, alcohol, and unprescribed drugs. These chemicals affect the formation and growth your baby.  Do not use any tobacco products, such as cigarettes, chewing tobacco, and e-cigarettes. If you need help quitting, ask your health care provider.  Contact your health care provider if you have any questions. Keep all prenatal visits as told by your health care provider. This is important. This information is not intended to replace advice given to you by your health care provider. Make sure you discuss any questions you have with your health care provider. Document Released: 03/05/2001 Document Revised: 08/17/2015 Document Reviewed: 05/12/2012 Elsevier Interactive Patient Education  2017 Elsevier Inc.  

## 2016-02-14 NOTE — Progress Notes (Signed)
[id number] 3230w1d Estimated Date of Delivery: 07/30/16  Blood pressure 120/62, pulse 98, weight 222 lb 3.2 oz (100.8 kg), last menstrual period 10/18/2015, currently breastfeeding.   BP weight and urine results all reviewed and noted.  Please refer to the obstetrical flow sheet for the fundal height and fetal heart rate documentation:  Patient denies any bleeding and no rupture of membranes symptoms or regular contractions. Patient is without complaints. All questions were answered.  Orders Placed This Encounter  Procedures  . US OB Comp + 14 Wk  . Maternal Screen, Integrated #2  . POCT urinalysis dipstick    Plan:  Continued routine obstetrical care, 2nfd IT Return in about 3 weeks (around 03/06/2016) for LROB, UV:[id number]:Anatomy.

## 2016-02-17 LAB — MATERNAL SCREEN, INTEGRATED #2
AFP MARKER: 37.1 ng/mL
AFP MOM: 1.58
CROWN RUMP LENGTH: 56.9 mm
DIA MoM: 1.6
DIA Value: 224.7 pg/mL
Estriol, Unconjugated: 0.86 ng/mL
GEST. AGE ON COLLECTION DATE: 12.3 wk
GESTATIONAL AGE: 16.3 wk
HCG VALUE: 97.5 [IU]/mL
MATERNAL AGE AT EDD: 36.2 a
Nuchal Translucency (NT): 1.5 mm
Nuchal Translucency MoM: 1.02
Number of Fetuses: 1
PAPP-A MOM: 0.42
PAPP-A VALUE: 228.2 ng/mL
Test Results:: NEGATIVE
WEIGHT: 220 [lb_av]
WEIGHT: 222 [lb_av]
hCG MoM: 3.97
uE3 MoM: 1.12

## 2016-03-08 ENCOUNTER — Encounter: Payer: Self-pay | Admitting: Women's Health

## 2016-03-08 ENCOUNTER — Ambulatory Visit (INDEPENDENT_AMBULATORY_CARE_PROVIDER_SITE_OTHER): Payer: BLUE CROSS/BLUE SHIELD

## 2016-03-08 ENCOUNTER — Ambulatory Visit (INDEPENDENT_AMBULATORY_CARE_PROVIDER_SITE_OTHER): Payer: BLUE CROSS/BLUE SHIELD | Admitting: Women's Health

## 2016-03-08 DIAGNOSIS — O321XX1 Maternal care for breech presentation, fetus 1: Secondary | ICD-10-CM | POA: Diagnosis not present

## 2016-03-08 DIAGNOSIS — Z3482 Encounter for supervision of other normal pregnancy, second trimester: Secondary | ICD-10-CM

## 2016-03-08 DIAGNOSIS — Z363 Encounter for antenatal screening for malformations: Secondary | ICD-10-CM | POA: Diagnosis not present

## 2016-03-08 DIAGNOSIS — Z3402 Encounter for supervision of normal first pregnancy, second trimester: Secondary | ICD-10-CM

## 2016-03-08 DIAGNOSIS — Z3A20 20 weeks gestation of pregnancy: Secondary | ICD-10-CM | POA: Diagnosis not present

## 2016-03-08 DIAGNOSIS — K900 Celiac disease: Secondary | ICD-10-CM

## 2016-03-08 DIAGNOSIS — Z331 Pregnant state, incidental: Secondary | ICD-10-CM

## 2016-03-08 DIAGNOSIS — Z1389 Encounter for screening for other disorder: Secondary | ICD-10-CM

## 2016-03-08 LAB — POCT URINALYSIS DIPSTICK
Glucose, UA: NEGATIVE
Ketones, UA: NEGATIVE
NITRITE UA: NEGATIVE
PROTEIN UA: NEGATIVE
RBC UA: NEGATIVE

## 2016-03-08 NOTE — Progress Notes (Signed)
Low-risk OB appointment [id number] 5767w3d Estimated Date of Delivery: 07/30/16 LMP 10/18/2015 (Approximate)   BP, weight, and urine reviewed.  Refer to obstetrical flow sheet for FH & FHR.  Reports good fm.  Denies regular uc's, lof, vb, or uti s/s. No complaints. Reviewed today's normal anatomy u/s, warning s/s to report, fm. Plan:  Continue routine obstetrical care  F/U in 4wks for OB appointment

## 2016-03-08 NOTE — Patient Instructions (Signed)
Second Trimester of Pregnancy The second trimester is from week 13 through week 28 (months 4 through 6). The second trimester is often a time when you feel your best. Your body has also adjusted to being pregnant, and you begin to feel better physically. Usually, morning sickness has lessened or quit completely, you may have more energy, and you may have an increase in appetite. The second trimester is also a time when the fetus is growing rapidly. At the end of the sixth month, the fetus is about 9 inches long and weighs about 1 pounds. You will likely begin to feel the baby move (quickening) between 18 and 20 weeks of the pregnancy. Body changes during your second trimester Your body continues to go through many changes during your second trimester. The changes vary from woman to woman.  Your weight will continue to increase. You will notice your lower abdomen bulging out.  You may begin to get stretch marks on your hips, abdomen, and breasts.  You may develop headaches that can be relieved by medicines. The medicines should be approved by your health care provider.  You may urinate more often because the fetus is pressing on your bladder.  You may develop or continue to have heartburn as a result of your pregnancy.  You may develop constipation because certain hormones are causing the muscles that push waste through your intestines to slow down.  You may develop hemorrhoids or swollen, bulging veins (varicose veins).  You may have back pain. This is caused by:  Weight gain.  Pregnancy hormones that are relaxing the joints in your pelvis.  A shift in weight and the muscles that support your balance.  Your breasts will continue to grow and they will continue to become tender.  Your gums may bleed and may be sensitive to brushing and flossing.  Dark spots or blotches (chloasma, mask of pregnancy) may develop on your face. This will likely fade after the baby is born.  A dark line  from your belly button to the pubic area (linea nigra) may appear. This will likely fade after the baby is born.  You may have changes in your hair. These can include thickening of your hair, rapid growth, and changes in texture. Some women also have hair loss during or after pregnancy, or hair that feels dry or thin. Your hair will most likely return to normal after your baby is born. What to expect at prenatal visits During a routine prenatal visit:  You will be weighed to make sure you and the fetus are growing normally.  Your blood pressure will be taken.  Your abdomen will be measured to track your baby's growth.  The fetal heartbeat will be listened to.  Any test results from the previous visit will be discussed. Your health care provider may ask you:  How you are feeling.  If you are feeling the baby move.  If you have had any abnormal symptoms, such as leaking fluid, bleeding, severe headaches, or abdominal cramping.  If you are using any tobacco products, including cigarettes, chewing tobacco, and electronic cigarettes.  If you have any questions. Other tests that may be performed during your second trimester include:  Blood tests that check for:  Low iron levels (anemia).  Gestational diabetes (between 24 and 28 weeks).  Rh antibodies. This is to check for a protein on red blood cells (Rh factor).  Urine tests to check for infections, diabetes, or protein in the urine.  An ultrasound to   confirm the proper growth and development of the baby.  An amniocentesis to check for possible genetic problems.  Fetal screens for spina bifida and Down syndrome.  HIV (human immunodeficiency virus) testing. Routine prenatal testing includes screening for HIV, unless you choose not to have this test. Follow these instructions at home: Eating and drinking  Continue to eat regular, healthy meals.  Avoid raw meat, uncooked cheese, cat litter boxes, and soil used by cats. These  carry germs that can cause birth defects in the baby.  Take your prenatal vitamins.  Take 1500-2000 mg of calcium daily starting at the 20th week of pregnancy until you deliver your baby.  If you develop constipation:  Take over-the-counter or prescription medicines.  Drink enough fluid to keep your urine clear or pale yellow.  Eat foods that are high in fiber, such as fresh fruits and vegetables, whole grains, and beans.  Limit foods that are high in fat and processed sugars, such as fried and sweet foods. Activity  Exercise only as directed by your health care provider. Experiencing uterine cramps is a good sign to stop exercising.  Avoid heavy lifting, wear low heel shoes, and practice good posture.  Wear your seat belt at all times when driving.  Rest with your legs elevated if you have leg cramps or low back pain.  Wear a good support bra for breast tenderness.  Do not use hot tubs, steam rooms, or saunas. Lifestyle  Avoid all smoking, herbs, alcohol, and unprescribed drugs. These chemicals affect the formation and growth of the baby.  Do not use any products that contain nicotine or tobacco, such as cigarettes and e-cigarettes. If you need help quitting, ask your health care provider.  A sexual relationship may be continued unless your health care provider directs you otherwise. General instructions  Follow your health care provider's instructions regarding medicine use. There are medicines that are either safe or unsafe to take during pregnancy.  Take warm sitz baths to soothe any pain or discomfort caused by hemorrhoids. Use hemorrhoid cream if your health care provider approves.  If you develop varicose veins, wear support hose. Elevate your feet for 15 minutes, 3-4 times a day. Limit salt in your diet.  Visit your dentist if you have not gone yet during your pregnancy. Use a soft toothbrush to brush your teeth and be gentle when you floss.  Keep all follow-up  prenatal visits as told by your health care provider. This is important. Contact a health care provider if:  You have dizziness.  You have mild pelvic cramps, pelvic pressure, or nagging pain in the abdominal area.  You have persistent nausea, vomiting, or diarrhea.  You have a bad smelling vaginal discharge.  You have pain with urination. Get help right away if:  You have a fever.  You are leaking fluid from your vagina.  You have spotting or bleeding from your vagina.  You have severe abdominal cramping or pain.  You have rapid weight gain or weight loss.  You have shortness of breath with chest pain.  You notice sudden or extreme swelling of your face, hands, ankles, feet, or legs.  You have not felt your baby move in over an hour.  You have severe headaches that do not go away with medicine.  You have vision changes. Summary  The second trimester is from week 13 through week 28 (months 4 through 6). It is also a time when the fetus is growing rapidly.  Your body goes   through many changes during pregnancy. The changes vary from woman to woman.  Avoid all smoking, herbs, alcohol, and unprescribed drugs. These chemicals affect the formation and growth your baby.  Do not use any tobacco products, such as cigarettes, chewing tobacco, and e-cigarettes. If you need help quitting, ask your health care provider.  Contact your health care provider if you have any questions. Keep all prenatal visits as told by your health care provider. This is important. This information is not intended to replace advice given to you by your health care provider. Make sure you discuss any questions you have with your health care provider. Document Released: 03/05/2001 Document Revised: 08/17/2015 Document Reviewed: 05/12/2012 Elsevier Interactive Patient Education  2017 Elsevier Inc.  

## 2016-03-08 NOTE — Progress Notes (Signed)
US 19+3 wks,breech,cx 3.2 cm,post pl gr 0,normal ov's bilat,fhr 151 bpm,svp of fluid 4.2 cm,efw 274 g,anatomy complete no obvious abnormalities seen

## 2016-03-25 NOTE — L&D Delivery Note (Signed)
Delivery Note Arrived to patient's room due to repetitive variable decelerations with return to baseline, patient was found to be 8cm dilated, 90%, and 0 station, after IUPC was placed and amnioinfusion started, patient progressed to complete dilation and with 3 pushes, delivered a viable female infant.   At 7:54 PM a viable female was delivered via Vaginal, Spontaneous Delivery (Presentation:ROA). Loose nuchal cord x1 reduced after precipitous delivery of body. Cord clamped x2 and cut.   Placenta status: Delivered intact with gentle contractions. Cord: 3 vessels with the following complications: True Knot in umbilical cord. Cord pH: Not collected  APGAR: 8, 9; weight pending.  Anesthesia: Nitrous oxide Episiotomy: None Lacerations:  Perineal 2nd degree Suture Repair: 3.0 vicryl Est. Blood Loss (mL):  150cc  Mom to postpartum.  Baby to Couplet care / Skin to Skin.  Lovena Neighbours, PGY-1 07/23/2016, 8:05 PM  OB FELLOW DELIVERY ATTESTATION  I was gloved and present for the delivery in its entirety, and I agree with the above resident's note.    Jen Mow, DO OB Fellow

## 2016-04-05 ENCOUNTER — Encounter: Payer: Self-pay | Admitting: Obstetrics & Gynecology

## 2016-04-05 ENCOUNTER — Ambulatory Visit (INDEPENDENT_AMBULATORY_CARE_PROVIDER_SITE_OTHER): Payer: BLUE CROSS/BLUE SHIELD | Admitting: Obstetrics & Gynecology

## 2016-04-05 VITALS — BP 114/67 | HR 103 | Wt 231.0 lb

## 2016-04-05 DIAGNOSIS — Z3482 Encounter for supervision of other normal pregnancy, second trimester: Secondary | ICD-10-CM

## 2016-04-05 DIAGNOSIS — Z3A24 24 weeks gestation of pregnancy: Secondary | ICD-10-CM

## 2016-04-05 DIAGNOSIS — Z1389 Encounter for screening for other disorder: Secondary | ICD-10-CM

## 2016-04-05 DIAGNOSIS — Z331 Pregnant state, incidental: Secondary | ICD-10-CM

## 2016-04-05 LAB — POCT URINALYSIS DIPSTICK
Blood, UA: NEGATIVE
Glucose, UA: NEGATIVE
KETONES UA: NEGATIVE
Leukocytes, UA: NEGATIVE
Nitrite, UA: NEGATIVE
PROTEIN UA: NEGATIVE

## 2016-04-05 NOTE — Progress Notes (Signed)
[id number] 3760w3d Estimated Date of Delivery: 07/30/16  Blood pressure 114/67, pulse (!) 103, weight 231 lb (104.8 kg), last menstrual period 10/18/2015, currently breastfeeding.   BP weight and urine results all reviewed and noted.  Please refer to the obstetrical flow sheet for the fundal height and fetal heart rate documentation:  Patient reports good fetal movement, denies any bleeding and no rupture of membranes symptoms or regular contractions. Patient is without complaints. All questions were answered.  Orders Placed This Encounter  Procedures  . POCT urinalysis dipstick    Plan:  Continued routine obstetrical care,   Return in about 4 weeks (around 05/03/2016) for PN2, , LROB.

## 2016-05-03 ENCOUNTER — Encounter: Payer: Self-pay | Admitting: Women's Health

## 2016-05-03 ENCOUNTER — Ambulatory Visit (INDEPENDENT_AMBULATORY_CARE_PROVIDER_SITE_OTHER): Payer: BLUE CROSS/BLUE SHIELD | Admitting: Women's Health

## 2016-05-03 ENCOUNTER — Other Ambulatory Visit: Payer: BLUE CROSS/BLUE SHIELD

## 2016-05-03 VITALS — BP 124/64 | HR 72 | Wt 242.0 lb

## 2016-05-03 DIAGNOSIS — Z3A27 27 weeks gestation of pregnancy: Secondary | ICD-10-CM

## 2016-05-03 DIAGNOSIS — Z331 Pregnant state, incidental: Secondary | ICD-10-CM

## 2016-05-03 DIAGNOSIS — Z3482 Encounter for supervision of other normal pregnancy, second trimester: Secondary | ICD-10-CM

## 2016-05-03 DIAGNOSIS — Z131 Encounter for screening for diabetes mellitus: Secondary | ICD-10-CM | POA: Diagnosis not present

## 2016-05-03 DIAGNOSIS — Z1389 Encounter for screening for other disorder: Secondary | ICD-10-CM

## 2016-05-03 LAB — POCT URINALYSIS DIPSTICK
Blood, UA: NEGATIVE
Glucose, UA: NEGATIVE
Ketones, UA: NEGATIVE
LEUKOCYTES UA: NEGATIVE
NITRITE UA: NEGATIVE
PROTEIN UA: NEGATIVE

## 2016-05-03 NOTE — Patient Instructions (Addendum)
Call the office [phone]) or go to [facility] if:  You begin to have strong, frequent contractions  Your water breaks.  Sometimes it is a big gush of fluid, sometimes it is just a trickle that keeps getting your panties wet or running down your legs  You have vaginal bleeding.  It is normal to have a small amount of spotting if your cervix was checked.   You don't feel your baby moving like normal.  If you don't, get you something to eat and drink and lay down and focus on feeling your baby move.  You should feel at least 10 movements in 2 hours.  If you don't, you should call the office or go to [facility].    Tdap Vaccine  It is recommended that you get the Tdap vaccine during the third trimester of EACH pregnancy to help protect your baby from getting pertussis (whooping cough)  27-36 weeks is the BEST time to do this so that you can pass the protection on to your baby. During pregnancy is better than after pregnancy, but if you are unable to get it during pregnancy it will be offered at the hospital.   You can get this vaccine at the health department or your family doctor  Everyone who will be around your baby should also be up-to-date on their vaccines. Adults (who are not pregnant) only need 1 dose of Tdap during adulthood.   For Dizzy Spells:   This is usually related to either your blood sugar or your blood pressure dropping  Make sure you are staying well hydrated and drinking enough water so that your urine is clear  Eat small frequent meals and snacks containing protein (meat, eggs, nuts, cheese) so that your blood sugar doesn't drop  If you do get dizzy, sit/lay down and get you something to drink and a snack containing protein- you will usually start feeling better in 10-20 minutes    For Headaches:   Stay well hydrated, drink enough water so that your urine is clear, sometimes if you are dehydrated you can get headaches  Eat small frequent meals and  snacks, sometimes if you are hungry you can get headaches  Sometimes you get headaches during pregnancy from the pregnancy hormones  You can try tylenol (1-2 regular strength '325mg'$  or 1-2 extra strength '500mg'$ ) as directed on the box. The least amount of medication that works is best.   Cool compresses (cool wet washcloth or ice pack) to area of head that is hurting  You can also try drinking a caffeinated drink to see if this will help  If not helping, try below:  For Prevention of Headaches/Migraines:  CoQ10 '100mg'$  three times daily  Vitamin B2 '400mg'$  daily  Magnesium Oxide 400-'600mg'$  daily  If You Get a Bad Headache/Migraine:  Benadryl '25mg'$    Magnesium Oxide  1 large Gatorade  2 extra strength Tylenol (1,'000mg'$  total)  1 cup coffee or Coke  If this doesn't help please call us @ [phone]    Third Trimester of Pregnancy The third trimester is from week 29 through week 42, months 7 through 9. The third trimester is a time when the fetus is growing rapidly. At the end of the ninth month, the fetus is about 20 inches in length and weighs 6-10 pounds.  BODY CHANGES Your body goes through many changes during pregnancy. The changes vary from woman to woman.   Your weight will continue to increase. You can expect to gain 25-35  pounds (11-16 kg) by the end of the pregnancy.  You may begin to get stretch marks on your hips, abdomen, and breasts.  You may urinate more often because the fetus is moving lower into your pelvis and pressing on your bladder.  You may develop or continue to have heartburn as a result of your pregnancy.  You may develop constipation because certain hormones are causing the muscles that push waste through your intestines to slow down.  You may develop hemorrhoids or swollen, bulging veins (varicose veins).  You may have pelvic pain because of the weight gain and pregnancy hormones relaxing your joints between the bones in your pelvis. Backaches may  result from overexertion of the muscles supporting your posture.  You may have changes in your hair. These can include thickening of your hair, rapid growth, and changes in texture. Some women also have hair loss during or after pregnancy, or hair that feels dry or thin. Your hair will most likely return to normal after your baby is born.  Your breasts will continue to grow and be tender. A yellow discharge may leak from your breasts called colostrum.  Your belly button may stick out.  You may feel short of breath because of your expanding uterus.  You may notice the fetus "dropping," or moving lower in your abdomen.  You may have a bloody mucus discharge. This usually occurs a few days to a week before labor begins.  Your cervix becomes thin and soft (effaced) near your due date. WHAT TO EXPECT AT YOUR PRENATAL EXAMS  You will have prenatal exams every 2 weeks until week 36. Then, you will have weekly prenatal exams. During a routine prenatal visit:  You will be weighed to make sure you and the fetus are growing normally.  Your blood pressure is taken.  Your abdomen will be measured to track your baby's growth.  The fetal heartbeat will be listened to.  Any test results from the previous visit will be discussed.  You may have a cervical check near your due date to see if you have effaced. At around 36 weeks, your caregiver will check your cervix. At the same time, your caregiver will also perform a test on the secretions of the vaginal tissue. This test is to determine if a type of bacteria, Group B streptococcus, is present. Your caregiver will explain this further. Your caregiver may ask you:  What your birth plan is.  How you are feeling.  If you are feeling the baby move.  If you have had any abnormal symptoms, such as leaking fluid, bleeding, severe headaches, or abdominal cramping.  If you have any questions. Other tests or screenings that may be performed during your  third trimester include:  Blood tests that check for low iron levels (anemia).  Fetal testing to check the health, activity level, and growth of the fetus. Testing is done if you have certain medical conditions or if there are problems during the pregnancy. FALSE LABOR You may feel small, irregular contractions that eventually go away. These are called Braxton Hicks contractions, or false labor. Contractions may last for hours, days, or even weeks before true labor sets in. If contractions come at regular intervals, intensify, or become painful, it is best to be seen by your caregiver.  SIGNS OF LABOR   Menstrual-like cramps.  Contractions that are 5 minutes apart or less.  Contractions that start on the top of the uterus and spread down to the lower abdomen and  back.  A sense of increased pelvic pressure or back pain.  A watery or bloody mucus discharge that comes from the vagina. If you have any of these signs before the 37th week of pregnancy, call your caregiver right away. You need to go to the hospital to get checked immediately. HOME CARE INSTRUCTIONS   Avoid all smoking, herbs, alcohol, and unprescribed drugs. These chemicals affect the formation and growth of the baby.  Follow your caregiver's instructions regarding medicine use. There are medicines that are either safe or unsafe to take during pregnancy.  Exercise only as directed by your caregiver. Experiencing uterine cramps is a good sign to stop exercising.  Continue to eat regular, healthy meals.  Wear a good support bra for breast tenderness.  Do not use hot tubs, steam rooms, or saunas.  Wear your seat belt at all times when driving.  Avoid raw meat, uncooked cheese, cat litter boxes, and soil used by cats. These carry germs that can cause birth defects in the baby.  Take your prenatal vitamins.  Try taking a stool softener (if your caregiver approves) if you develop constipation. Eat more high-fiber foods,  such as fresh vegetables or fruit and whole grains. Drink plenty of fluids to keep your urine clear or pale yellow.  Take warm sitz baths to soothe any pain or discomfort caused by hemorrhoids. Use hemorrhoid cream if your caregiver approves.  If you develop varicose veins, wear support hose. Elevate your feet for 15 minutes, 3-4 times a day. Limit salt in your diet.  Avoid heavy lifting, wear low heal shoes, and practice good posture.  Rest a lot with your legs elevated if you have leg cramps or low back pain.  Visit your dentist if you have not gone during your pregnancy. Use a soft toothbrush to brush your teeth and be gentle when you floss.  A sexual relationship may be continued unless your caregiver directs you otherwise.  Do not travel far distances unless it is absolutely necessary and only with the approval of your caregiver.  Take prenatal classes to understand, practice, and ask questions about the labor and delivery.  Make a trial run to the hospital.  Pack your hospital bag.  Prepare the baby's nursery.  Continue to go to all your prenatal visits as directed by your caregiver. SEEK MEDICAL CARE IF:  You are unsure if you are in labor or if your water has broken.  You have dizziness.  You have mild pelvic cramps, pelvic pressure, or nagging pain in your abdominal area.  You have persistent nausea, vomiting, or diarrhea.  You have a bad smelling vaginal discharge.  You have pain with urination. SEEK IMMEDIATE MEDICAL CARE IF:   You have a fever.  You are leaking fluid from your vagina.  You have spotting or bleeding from your vagina.  You have severe abdominal cramping or pain.  You have rapid weight loss or gain.  You have shortness of breath with chest pain.  You notice sudden or extreme swelling of your face, hands, ankles, feet, or legs.  You have not felt your baby move in over an hour.  You have severe headaches that do not go away with  medicine.  You have vision changes. Document Released: 03/05/2001 Document Revised: 03/16/2013 Document Reviewed: 05/12/2012 [facility] Patient Information 2015 [place], [place]. This information is not intended to replace advice given to you by your health care provider. Make sure you discuss any questions you have with your health care  provider.   Thinking About Doren Custard???  Why consider waterbirth? . Gentle birth for babies . Less pain medicine used in labor . May allow for passive descent/less pushing . May reduce perineal tears  . More mobility and instinctive maternal position changes . Increased maternal relaxation . Reduced blood pressure in labor  Is waterbirth safe? What are the risks of infection, drowning or other complications? . Infection o Very low risk (3.7 % for tub vs 4.8% for bed) o 7 in 8000 waterbirths with documented infection o Poorly cleaned equipment most common cause o Slightly lower group B strep transmission rate  . Drowning o Maternal:  - Very low risk   - Related to seizures or fainting o Newborn:  - Very low risk. No evidence of increased risk of respiratory problems in multiple large studies - Physiological protection from breathing under water - Avoid underwater birth if there are any fetal complications - Once baby's head is out of the water, keep it out.  . Birth complication o Some reports of cord trauma, but risk decreased by bringing baby to surface gradually o No evidence of increased risk of shoulder dystocia. Mothers can usually change positions faster in water than in a bed, possibly aiding the maneuvers to free the shoulder.  You must attend a Doren Custard class at [facility]  3rd Wednesday of every month from 7-9pm  Free  Register by calling [phone] or online at [email or web address]  Bring [place] the certificate from the class  Waterbirth supplies needed for [facility] patients:  Our practice has a Heritage manager in a  Box tub (Regular size) at the hospital that you can borrow  You will need to purchase an accessory kit that has all needed supplies through [place] [phone] for kit, $65 for liner=$179+tax) or online through [employer]is  Or you can purchase the supplies separately: o Single-use disposable tub liner for [employer] in a Box (REGULAR size) o New garden hose labeled "lead-free", "suitable for drinking water", "non-toxic" OR "water potable" o Garden hose to remove the dirty water o Electric drain pump to remove water (We recommend 792 gallon per hour or greater pump.)  o Fish net o Bathing suit top (optional) o Long-handled mirror (optional)  [employer]- sells EVERYTHING waterbirth related, accessory kits, tubs, etc  The [employer] (www.thelaborladies.com) this is a great service if you don't want to be responsible for the set up/take down of tub! Just call the Labor Ladies and they will come do it for you for $280! This includes the rental fee for their tub, the accessory kit, set-up and take down   Things that would prevent you from having a waterbirth:  Premature, <37wks  Previous cesarean birth  Presence of thick meconium-stained fluid  Multiple gestation (Twins, triplets, etc.)  Uncontrolled diabetes  Hypertension  Heavy vaginal bleeding  Non-reassuring fetal heart rate  Active infection (MRSA, etc.)  If your labor has to be induced  Other risk issues identified by your obstetrical provider

## 2016-05-03 NOTE — Progress Notes (Signed)
Low-risk OB appointment [id number] 3267w3d Estimated Date of Delivery: 07/30/16 BP 124/64   Pulse 72   Wt 242 lb (109.8 kg)   LMP 10/18/2015 (Approximate)   BMI 38.47 kg/m   BP, weight, and urine reviewed.  Refer to obstetrical flow sheet for FH & FHR.  Reports good fm.  Denies regular uc's, lof, vb, or uti s/s. Headaches, dizzy spells, gave printed prevention/relief measures for both Interested in waterbirth, going to March class Reviewed ptl s/s, fkc. Recommended Tdap at HD/PCP per CDC guidelines.  Plan:  Continue routine obstetrical care  F/U in 3wks for OB appointment  PN2 today

## 2016-05-03 NOTE — Progress Notes (Signed)
-  24.2

## 2016-05-04 LAB — CBC
HEMATOCRIT: 34.1 % (ref 34.0–46.6)
HEMOGLOBIN: 11.3 g/dL (ref 11.1–15.9)
MCH: 29 pg (ref 26.6–33.0)
MCHC: 33.1 g/dL (ref 31.5–35.7)
MCV: 88 fL (ref 79–97)
Platelets: 195 10*3/uL (ref 150–379)
RBC: 3.89 x10E6/uL (ref 3.77–5.28)
RDW: 13.7 % (ref 12.3–15.4)
WBC: 10.7 10*3/uL (ref 3.4–10.8)

## 2016-05-04 LAB — GLUCOSE TOLERANCE, 2 HOURS W/ 1HR
Glucose, 1 hour: 132 mg/dL (ref 65–179)
Glucose, 2 hour: 124 mg/dL (ref 65–152)
Glucose, Fasting: 102 mg/dL — ABNORMAL HIGH (ref 65–91)

## 2016-05-04 LAB — HIV ANTIBODY (ROUTINE TESTING W REFLEX): HIV Screen 4th Generation wRfx: NONREACTIVE

## 2016-05-04 LAB — RPR: RPR Ser Ql: NONREACTIVE

## 2016-05-04 LAB — ANTIBODY SCREEN: ANTIBODY SCREEN: NEGATIVE

## 2016-05-06 ENCOUNTER — Other Ambulatory Visit: Payer: Self-pay | Admitting: Women's Health

## 2016-05-06 ENCOUNTER — Telehealth: Payer: Self-pay | Admitting: *Deleted

## 2016-05-06 ENCOUNTER — Encounter: Payer: Self-pay | Admitting: Women's Health

## 2016-05-06 DIAGNOSIS — Z8632 Personal history of gestational diabetes: Secondary | ICD-10-CM | POA: Insufficient documentation

## 2016-05-06 DIAGNOSIS — O24410 Gestational diabetes mellitus in pregnancy, diet controlled: Secondary | ICD-10-CM

## 2016-05-06 NOTE — Telephone Encounter (Signed)
Patient called requesting that she see her dietician Angie who manages her Celiac disease now that she has been diagnosed with GDM. Please advise.

## 2016-05-06 NOTE — Telephone Encounter (Signed)
Informed patient that if Angie her dietician is [place] and has training in gestational diabetes/glucometers that she provides for pt along w/ info on checking sugars that is fine. The appt is not just for diet, but they give glucometer, go over checking sugars qid/goals, etc. If not, she needs to schedule w/ our dietician, then she can always f/u w/ her dietician after that initial visit. Pt verbalized understanding.

## 2016-05-13 ENCOUNTER — Telehealth: Payer: Self-pay | Admitting: *Deleted

## 2016-05-13 NOTE — Telephone Encounter (Signed)
Patient called stating she has not received a call from the dietician regarding her GDM. Will give them a call and see why they have not contacted her.

## 2016-05-20 ENCOUNTER — Telehealth: Payer: Self-pay | Admitting: *Deleted

## 2016-05-20 ENCOUNTER — Encounter: Payer: BLUE CROSS/BLUE SHIELD | Attending: Obstetrics and Gynecology | Admitting: Nutrition

## 2016-05-20 ENCOUNTER — Ambulatory Visit: Payer: BLUE CROSS/BLUE SHIELD

## 2016-05-20 VITALS — Ht 67.0 in | Wt 249.0 lb

## 2016-05-20 DIAGNOSIS — Z713 Dietary counseling and surveillance: Secondary | ICD-10-CM | POA: Insufficient documentation

## 2016-05-20 DIAGNOSIS — Z3A00 Weeks of gestation of pregnancy not specified: Secondary | ICD-10-CM | POA: Insufficient documentation

## 2016-05-20 DIAGNOSIS — O24410 Gestational diabetes mellitus in pregnancy, diet controlled: Secondary | ICD-10-CM | POA: Insufficient documentation

## 2016-05-20 DIAGNOSIS — E669 Obesity, unspecified: Secondary | ICD-10-CM

## 2016-05-20 DIAGNOSIS — O24419 Gestational diabetes mellitus in pregnancy, unspecified control: Secondary | ICD-10-CM

## 2016-05-20 NOTE — Progress Notes (Signed)
Diabetes Self-Management Education  Visit Type: First/Initial  Appt. Start Time: 0800 Appt. End Time: 0930  05/20/2016  Julia Johnson DoorErin Chisum, identified by name and date of birth, is a 36 y.o. female with a diagnosis of Diabetes: Gestational Diabetes. Here with her husband who is in a wheelchair. This will be their 3rd child. [redacted] weeks gestation. EDD 07/30/16. 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 Last child weighted almost 10 lbs she notes and first child was over 8 lbs. Wasn't told she had GDM last pregnancy but her baby had low blood sugar after delivery. She notes she has celiac disease.  ASSESSMENT  Height 5\' 7"  (1.702 m), weight 249 lb (112.9 kg), last menstrual period 10/18/2015, currently breastfeeding. Body mass index is 39 kg/m.      Diabetes Self-Management Education - 05/20/16 0819      Visit Information   Visit Type First/Initial     Initial Visit   Diabetes Type Gestational Diabetes   Are you currently following a meal plan? No   Are you taking your medications as prescribed? No   Date Diagnosed 05/03/2016     Health Coping   How would you rate your overall health? Good     Psychosocial Assessment   Patient Belief/Attitude about Diabetes Motivated to manage diabetes   Self-care barriers None   Self-management support Doctor's office;Family   Other persons present Patient;Spouse/SO   Patient Concerns Nutrition/Meal planning;Medication;Monitoring;Healthy Lifestyle;Glycemic Control;Weight Control   Special Needs None   Preferred Learning Style No preference indicated   Learning Readiness Change in progress   How often do you need to have someone help you when you read instructions, pamphlets, or other written materials from your doctor or pharmacy? 1 - Never   What is the last grade level you completed in school? 16     Pre-Education Assessment   Patient understands the diabetes disease and treatment process. Needs Instruction   Patient understands incorporating nutritional  management into lifestyle. Needs Instruction   Patient undertands incorporating physical activity into lifestyle. Needs Instruction   Patient understands using medications safely. Needs Instruction   Patient understands monitoring blood glucose, interpreting and using results Needs Instruction   Patient understands prevention, detection, and treatment of acute complications. Needs Instruction   Patient understands prevention, detection, and treatment of chronic complications. Needs Instruction   Patient understands how to develop strategies to address psychosocial issues. Needs Instruction   Patient understands how to develop strategies to promote health/change behavior. Needs Instruction     Complications   How often do you check your blood sugar? 0 times/day (not testing)     Exercise   Exercise Type ADL's     Patient Education   Preconception care Pregnancy and GDM  Role of pre-pregnancy blood glucose control on the development of the fetus;Reviewed with patient blood glucose goals with pregnancy;Role of family planning for patients with diabetes     Individualized Goals (developed by patient)   Nutrition Follow meal plan discussed;General guidelines for healthy choices and portions discussed;Adjust meds/carbs with exercise as discussed   Physical Activity Exercise 3-5 times per week;30 minutes per day   Monitoring  test my blood glucose as discussed;test blood glucose pre and post meals as discussed   Reducing Risk examine blood glucose patterns   Health Coping ask for help with (comment)  Carb counting or meal planning     Post-Education Assessment   Patient understands the diabetes disease and treatment process. Demonstrates understanding / competency   Patient understands incorporating  nutritional management into lifestyle. Demonstrates understanding / competency   Patient undertands incorporating physical activity into lifestyle. Demonstrates understanding / competency      Outcomes   Expected Outcomes Demonstrated interest in learning. Expect positive outcomes   Future DMSE --  1 week   Program Status Completed      Individualized Plan for Diabetes Self-Management Training:   Learning Objective:  Patient will have a greater understanding of diabetes self-management. Patient education plan is to attend individual and/or group sessions per assessed needs and concerns.   Blood Glucose Monitoring Instruction  Assessment:  Primary concerns today: Patient here for instruction on Blood Glucose Monitoring. They  do not have their own meter at this time.  Meter Provided: Yes If Yes, Brand: Accucheck Guide Lot #: [id number]  Expiration Date:2.1.19  Medications:     Intervention:    Explained rationale of testing BG to obtain data as to how their diabetes is being managed.  Provided Target Ranges for both pre and post meals  Explained factors that effect BG including food (carbohydrate), stress, activity level and insulin availability in the body including diabetes medications  Taught patient techniques for using BG monitor and lancing device  Discussed need for Rx for strips and lancets   Explained rationale of recording BG both for patient and MD to assess patterns as needed.    Plan:  Goals 1. Follow Gestational Meal Plan as discussed. 2. Test blood sugar before breakfast and 2 hours after each meal Goals for BS  Before breakfast 60-90 mg/dl  2 hours after meals < 120 mg/dl 3. Eat protein with all meals and snacks to help stabilize blood sugars. 4. Drink 5+ bottles or water per day 5. Notify MD if BS exceed >150 mg/dl 2-3 times in a row after meals or > 100 fasting 2-3 times in a row. Walk 30 minutes per day Record BS readings on log sheets and bring meter and BS log to all appts.  Expected Outcomes:  Demonstrated interest in learning. Expect positive outcomes  Education material provided: Living Well with Diabetes, Meal plan card, My Plate and  Carbohydrate counting sheet Gestational DM Packet If problems or questions, patient to contact team via:  Phone and Email  Future DSME appointment:  (1 week)

## 2016-05-20 NOTE — Telephone Encounter (Signed)
Pt states she is using the Accu check Guide meter and Fast Click lancing device that uses the cartridges.  Called RX to [facility] in [place] for qs for 4 X daily testing with PRN refills.  Pt told to f/u with pharmacy and keep log of BS readings and bring log with her to visits.  Pt verbalized understanding.

## 2016-05-20 NOTE — Patient Instructions (Signed)
Goals 1. Follow Gestational Meal Plan as discussed. 2. Test blood sugar before breakfast and 2 hours after each meal Goals for BS  Before breakfast 60-90 mg/dl  2 hours after meals < 120 mg/dl 3. Eat protein with all meals and snacks to help stabilize blood sugars. 4. Drink 5+ bottles or water per day 5. Notify MD if BS exceed >150 mg/dl 2-3 times in a row after meals or > 100 fasting 2-3 times in a row. Walk 30 minutes per day Record BS readings on log sheets and bring meter and BS log to all appts.

## 2016-05-24 ENCOUNTER — Encounter: Payer: BLUE CROSS/BLUE SHIELD | Admitting: Obstetrics & Gynecology

## 2016-05-27 ENCOUNTER — Encounter: Payer: BLUE CROSS/BLUE SHIELD | Attending: Obstetrics and Gynecology | Admitting: Nutrition

## 2016-05-27 DIAGNOSIS — O24410 Gestational diabetes mellitus in pregnancy, diet controlled: Secondary | ICD-10-CM | POA: Diagnosis not present

## 2016-05-27 DIAGNOSIS — O24419 Gestational diabetes mellitus in pregnancy, unspecified control: Secondary | ICD-10-CM

## 2016-05-27 DIAGNOSIS — Z713 Dietary counseling and surveillance: Secondary | ICD-10-CM | POA: Diagnosis not present

## 2016-05-27 DIAGNOSIS — Z3A00 Weeks of gestation of pregnancy not specified: Secondary | ICD-10-CM | POA: Diagnosis not present

## 2016-05-27 NOTE — Progress Notes (Signed)
Diabetes Self-Management Education  Visit Type:    Appt. Start Time: 0800 Appt. End Time: 0930  05/27/2016  Ms. Julia Johnson, identified by name and date of birth, is a 36 y.o. female with a diagnosis of Diabetes:   Here with her husband.  31 weeks.BS avg 94 before meals and 110 mg/dl after meals 2 hours. Feels good. 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  Has been working on trying to eat earlier in am and meals on better schedule.    Has celiac disease and limited with certain foods due to gluten content. Diet is better balanced overall. Eating some concentrated sweets of ice cream and orange juice at times. Stressed need to avoid when possible to keep BS WNL.   ASSESSMENT  Last menstrual period 10/18/2015, currently breastfeeding. There is no height or weight on file to calculate BMI.      Diabetes Self-Management Education - 05/20/16 0819      Visit Information   Visit Type First/Initial     Initial Visit   Diabetes Type Gestational Diabetes   Are you currently following a meal plan? No   Are you taking your medications as prescribed? No   Date Diagnosed 05/03/2016     Health Coping   How would you rate your overall health? Good     Psychosocial Assessment   Patient Belief/Attitude about Diabetes Motivated to manage diabetes   Self-care barriers None   Self-management support Doctor's office;Family   Other persons present Patient;Spouse/SO   Patient Concerns Nutrition/Meal planning;Medication;Monitoring;Healthy Lifestyle;Glycemic Control;Weight Control   Special Needs None   Preferred Learning Style No preference indicated   Learning Readiness Change in progress   How often do you need to have someone help you when you read instructions, pamphlets, or other written materials from your doctor or pharmacy? 1 - Never   What is the last grade level you completed in school? 16     Pre-Education Assessment   Patient understands the diabetes disease and treatment process. Needs  Instruction   Patient understands incorporating nutritional management into lifestyle. Needs Instruction   Patient undertands incorporating physical activity into lifestyle. Needs Instruction   Patient understands using medications safely. Needs Instruction   Patient understands monitoring blood glucose, interpreting and using results Needs Instruction   Patient understands prevention, detection, and treatment of acute complications. Needs Instruction   Patient understands prevention, detection, and treatment of chronic complications. Needs Instruction   Patient understands how to develop strategies to address psychosocial issues. Needs Instruction   Patient understands how to develop strategies to promote health/change behavior. Needs Instruction     Complications   How often do you check your blood sugar? 0 times/day (not testing)     Exercise   Exercise Type ADL's     Patient Education   Preconception care Pregnancy and GDM  Role of pre-pregnancy blood glucose control on the development of the fetus;Reviewed with patient blood glucose goals with pregnancy;Role of family planning for patients with diabetes     Individualized Goals (developed by patient)   Nutrition Follow meal plan discussed;General guidelines for healthy choices and portions discussed;Adjust meds/carbs with exercise as discussed   Physical Activity Exercise 3-5 times per week;30 minutes per day   Monitoring  test my blood glucose as discussed;test blood glucose pre and post meals as discussed   Reducing Risk examine blood glucose patterns   Health Coping ask for help with (comment)  Carb counting or meal planning     Post-Education Assessment  Patient understands the diabetes disease and treatment process. Demonstrates understanding / competency   Patient understands incorporating nutritional management into lifestyle. Demonstrates understanding / competency   Patient undertands incorporating physical activity into  lifestyle. Demonstrates understanding / competency     Outcomes   Expected Outcomes Demonstrated interest in learning. Expect positive outcomes   Future DMSE --  1 week   Program Status Completed      Individualized Plan for Diabetes Self-Management Training:   Learning Objective:  Patient will have a greater understanding of diabetes self-management. Patient education plan is to attend individual and/or group sessions per assessed needs and concerns.   Blood Glucose Monitoring Instruction  Assessment:  Primary concerns today: Patient here for instruction on Blood Glucose Monitoring. They  do not have their own meter at this time.  Meter Provided: Yes If Yes, Brand: Accucheck Guide Lot #: [id number]  Expiration Date:2.1.19  Medications:     Intervention:    Explained rationale of testing BG to obtain data as to how their diabetes is being managed.  Provided Target Ranges for both pre and post meals  Explained factors that effect BG including food (carbohydrate), stress, activity level and insulin availability in the body including diabetes medications  Taught patient techniques for using BG monitor and lancing device  Discussed need for Rx for strips and lancets   Explained rationale of recording BG both for patient and MD to assess patterns as needed.   Goals 1. Eat more low morevegetables 2. Try banana or mustard for leg cramps 3. Exercise as tolerated 4. Keep 2 hour blood sugars less than 120 and fasting less than 95 mg/dl.  Expected Outcomes:     Education material provided: Living Well with Diabetes, Meal plan card, My Plate and Carbohydrate counting sheet Gestational DM Packet If problems or questions, patient to contact team via:  Phone and Email  Future DSME appointment:    PRN. Contact Obgyn if blood sugars are elevated.

## 2016-05-27 NOTE — Patient Instructions (Signed)
Goals 1. Eat more low morevegetables 2. Try banana or mustard for leg cramps 3. Exercise as tolerated 4. Keep 2 hour blood sugars less than 120 and fasting less than 95 mg/dl.

## 2016-05-29 ENCOUNTER — Ambulatory Visit (INDEPENDENT_AMBULATORY_CARE_PROVIDER_SITE_OTHER): Payer: BLUE CROSS/BLUE SHIELD | Admitting: Advanced Practice Midwife

## 2016-05-29 ENCOUNTER — Encounter: Payer: Self-pay | Admitting: Advanced Practice Midwife

## 2016-05-29 VITALS — BP 110/60 | HR 80 | Wt 246.0 lb

## 2016-05-29 DIAGNOSIS — Z3A31 31 weeks gestation of pregnancy: Secondary | ICD-10-CM

## 2016-05-29 DIAGNOSIS — O0993 Supervision of high risk pregnancy, unspecified, third trimester: Secondary | ICD-10-CM

## 2016-05-29 DIAGNOSIS — O24419 Gestational diabetes mellitus in pregnancy, unspecified control: Secondary | ICD-10-CM

## 2016-05-29 DIAGNOSIS — O09523 Supervision of elderly multigravida, third trimester: Secondary | ICD-10-CM

## 2016-05-29 DIAGNOSIS — Z1389 Encounter for screening for other disorder: Secondary | ICD-10-CM

## 2016-05-29 DIAGNOSIS — Z331 Pregnant state, incidental: Secondary | ICD-10-CM

## 2016-05-29 LAB — POCT URINALYSIS DIPSTICK
Blood, UA: NEGATIVE
GLUCOSE UA: NEGATIVE
Ketones, UA: NEGATIVE
NITRITE UA: NEGATIVE
PROTEIN UA: NEGATIVE

## 2016-05-29 MED ORDER — GLYBURIDE 2.5 MG PO TABS: 2.5000 mg | ORAL_TABLET | Freq: Every day | ORAL | 3 refills | Status: DC

## 2016-05-29 NOTE — Patient Instructions (Signed)
Third Trimester of Pregnancy The third trimester is from week 28 through week 40 (months 7 through 9). The third trimester is a time when the unborn baby (fetus) is growing rapidly. At the end of the ninth month, the fetus is about 20 inches in length and weighs 6-10 pounds. Body changes during your third trimester Your body will continue to go through many changes during pregnancy. The changes vary from woman to woman. During the third trimester:  Your weight will continue to increase. You can expect to gain 25-35 pounds (11-16 kg) by the end of the pregnancy.  You may begin to get stretch marks on your hips, abdomen, and breasts.  You may urinate more often because the fetus is moving lower into your pelvis and pressing on your bladder.  You may develop or continue to have heartburn. This is caused by increased hormones that slow down muscles in the digestive tract.  You may develop or continue to have constipation because increased hormones slow digestion and cause the muscles that push waste through your intestines to relax.  You may develop hemorrhoids. These are swollen veins (varicose veins) in the rectum that can itch or be painful.  You may develop swollen, bulging veins (varicose veins) in your legs.  You may have increased body aches in the pelvis, back, or thighs. This is due to weight gain and increased hormones that are relaxing your joints.  You may have changes in your hair. These can include thickening of your hair, rapid growth, and changes in texture. Some women also have hair loss during or after pregnancy, or hair that feels dry or thin. Your hair will most likely return to normal after your baby is born.  Your breasts will continue to grow and they will continue to become tender. A yellow fluid (colostrum) may leak from your breasts. This is the first milk you are producing for your baby.  Your belly button may stick out.  You may notice more swelling in your hands,  face, or ankles.  You may have increased tingling or numbness in your hands, arms, and legs. The skin on your belly may also feel numb.  You may feel short of breath because of your expanding uterus.  You may have more problems sleeping. This can be caused by the size of your belly, increased need to urinate, and an increase in your body's metabolism.  You may notice the fetus "dropping," or moving lower in your abdomen (lightening).  You may have increased vaginal discharge.  You may notice your joints feel loose and you may have pain around your pelvic bone.  What to expect at prenatal visits You will have prenatal exams every 2 weeks until week 36. Then you will have weekly prenatal exams. During a routine prenatal visit:  You will be weighed to make sure you and the baby are growing normally.  Your blood pressure will be taken.  Your abdomen will be measured to track your baby's growth.  The fetal heartbeat will be listened to.  Any test results from the previous visit will be discussed.  You may have a cervical check near your due date to see if your cervix has softened or thinned (effaced).  You will be tested for Group B streptococcus. This happens between 35 and 37 weeks.  Your health care provider may ask you:  What your birth plan is.  How you are feeling.  If you are feeling the baby move.  If you have had   any abnormal symptoms, such as leaking fluid, bleeding, severe headaches, or abdominal cramping.  If you are using any tobacco products, including cigarettes, chewing tobacco, and electronic cigarettes.  If you have any questions.  Other tests or screenings that may be performed during your third trimester include:  Blood tests that check for low iron levels (anemia).  Fetal testing to check the health, activity level, and growth of the fetus. Testing is done if you have certain medical conditions or if there are problems during the  pregnancy.  Nonstress test (NST). This test checks the health of your baby to make sure there are no signs of problems, such as the baby not getting enough oxygen. During this test, a belt is placed around your belly. The baby is made to move, and its heart rate is monitored during movement.  What is false labor? False labor is a condition in which you feel small, irregular tightenings of the muscles in the womb (contractions) that usually go away with rest, changing position, or drinking water. These are called Braxton Hicks contractions. Contractions may last for hours, days, or even weeks before true labor sets in. If contractions come at regular intervals, become more frequent, increase in intensity, or become painful, you should see your health care provider. What are the signs of labor?  Abdominal cramps.  Regular contractions that start at 10 minutes apart and become stronger and more frequent with time.  Contractions that start on the top of the uterus and spread down to the lower abdomen and back.  Increased pelvic pressure and dull back pain.  A watery or bloody mucus discharge that comes from the vagina.  Leaking of amniotic fluid. This is also known as your "water breaking." It could be a slow trickle or a gush. Let your health care provider know if it has a color or strange odor. If you have any of these signs, call your health care provider right away, even if it is before your due date. Follow these instructions at home: Medicines  Follow your health care provider's instructions regarding medicine use. Specific medicines may be either safe or unsafe to take during pregnancy.  Take a prenatal vitamin that contains at least 600 micrograms (mcg) of folic acid.  If you develop constipation, try taking a stool softener if your health care provider approves. Eating and drinking  Eat a balanced diet that includes fresh fruits and vegetables, whole grains, good sources of protein  such as meat, eggs, or tofu, and low-fat dairy. Your health care provider will help you determine the amount of weight gain that is right for you.  Avoid raw meat and uncooked cheese. These carry germs that can cause birth defects in the baby.  If you have low calcium intake from food, talk to your health care provider about whether you should take a daily calcium supplement.  Eat four or five small meals rather than three large meals a day.  Limit foods that are high in fat and processed sugars, such as fried and sweet foods.  To prevent constipation: ? Drink enough fluid to keep your urine clear or pale yellow. ? Eat foods that are high in fiber, such as fresh fruits and vegetables, whole grains, and beans. Activity  Exercise only as directed by your health care provider. Most women can continue their usual exercise routine during pregnancy. Try to exercise for 30 minutes at least 5 days a week. Stop exercising if you experience uterine contractions.  Avoid heavy   lifting.  Do not exercise in extreme heat or humidity, or at high altitudes.  Wear low-heel, comfortable shoes.  Practice good posture.  You may continue to have sex unless your health care provider tells you otherwise. Relieving pain and discomfort  Take frequent breaks and rest with your legs elevated if you have leg cramps or low back pain.  Take warm sitz baths to soothe any pain or discomfort caused by hemorrhoids. Use hemorrhoid cream if your health care provider approves.  Wear a good support bra to prevent discomfort from breast tenderness.  If you develop varicose veins: ? Wear support pantyhose or compression stockings as told by your healthcare provider. ? Elevate your feet for 15 minutes, 3-4 times a day. Prenatal care  Write down your questions. Take them to your prenatal visits.  Keep all your prenatal visits as told by your health care provider. This is important. Safety  Wear your seat belt at  all times when driving.  Make a list of emergency phone numbers, including numbers for family, friends, the hospital, and police and fire departments. General instructions  Avoid cat litter boxes and soil used by cats. These carry germs that can cause birth defects in the baby. If you have a cat, ask someone to clean the litter box for you.  Do not travel far distances unless it is absolutely necessary and only with the approval of your health care provider.  Do not use hot tubs, steam rooms, or saunas.  Do not drink alcohol.  Do not use any products that contain nicotine or tobacco, such as cigarettes and e-cigarettes. If you need help quitting, ask your health care provider.  Do not use any medicinal herbs or unprescribed drugs. These chemicals affect the formation and growth of the baby.  Do not douche or use tampons or scented sanitary pads.  Do not cross your legs for long periods of time.  To prepare for the arrival of your baby: ? Take prenatal classes to understand, practice, and ask questions about labor and delivery. ? Make a trial run to the hospital. ? Visit the hospital and tour the maternity area. ? Arrange for maternity or paternity leave through employers. ? Arrange for family and friends to take care of pets while you are in the hospital. ? Purchase a rear-facing car seat and make sure you know how to install it in your car. ? Pack your hospital bag. ? Prepare the baby's nursery. Make sure to remove all pillows and stuffed animals from the baby's crib to prevent suffocation.  Visit your dentist if you have not gone during your pregnancy. Use a soft toothbrush to brush your teeth and be gentle when you floss. Contact a health care provider if:  You are unsure if you are in labor or if your water has broken.  You become dizzy.  You have mild pelvic cramps, pelvic pressure, or nagging pain in your abdominal area.  You have lower back pain.  You have persistent  nausea, vomiting, or diarrhea.  You have an unusual or bad smelling vaginal discharge.  You have pain when you urinate. Get help right away if:  Your water breaks before 37 weeks.  You have regular contractions less than 5 minutes apart before 37 weeks.  You have a fever.  You are leaking fluid from your vagina.  You have spotting or bleeding from your vagina.  You have severe abdominal pain or cramping.  You have rapid weight loss or weight gain.    You have shortness of breath with chest pain.  You notice sudden or extreme swelling of your face, hands, ankles, feet, or legs.  Your baby makes fewer than 10 movements in 2 hours.  You have severe headaches that do not go away when you take medicine.  You have vision changes. Summary  The third trimester is from week 28 through week 40, months 7 through 9. The third trimester is a time when the unborn baby (fetus) is growing rapidly.  During the third trimester, your discomfort may increase as you and your baby continue to gain weight. You may have abdominal, leg, and back pain, sleeping problems, and an increased need to urinate.  During the third trimester your breasts will keep growing and they will continue to become tender. A yellow fluid (colostrum) may leak from your breasts. This is the first milk you are producing for your baby.  False labor is a condition in which you feel small, irregular tightenings of the muscles in the womb (contractions) that eventually go away. These are called Braxton Hicks contractions. Contractions may last for hours, days, or even weeks before true labor sets in.  Signs of labor can include: abdominal cramps; regular contractions that start at 10 minutes apart and become stronger and more frequent with time; watery or bloody mucus discharge that comes from the vagina; increased pelvic pressure and dull back pain; and leaking of amniotic fluid. This information is not intended to replace advice  given to you by your health care provider. Make sure you discuss any questions you have with your health care provider. Document Released: 03/05/2001 Document Revised: 08/17/2015 Document Reviewed: 05/12/2012 Elsevier Interactive Patient Education  2017 [employer]  

## 2016-05-29 NOTE — Progress Notes (Signed)
Fetal Surveillance Testing today:  doppler   High Risk Pregnancy Diagnosis(es):   A1DM, now A2DM [id number] 2517w1d Estimated Date of Delivery: 07/30/16  Blood pressure 110/60, pulse 80, weight 246 lb (111.6 kg), last menstrual period 10/18/2015, currently breastfeeding.  Urinalysis: Negative   HPI: The patient is being seen today for ongoing management of the above. Today she reports FBS: 92-99   2 hour PP:  87-195 (6/26 >120).     BP weight and urine results all reviewed and noted. Patient reports good fetal movement, denies any bleeding and no rupture of membranes symptoms or regular contractions.  Fundal Height:  33 Fetal Heart rate:  145 Edema:  no  Patient is without complaints other than noted in her HPI. All questions were answered.  All lab and sonogram results have been reviewed. Comments:   FBS all > 90, will start meds  Assessment:  1.  Pregnancy at 4617w1d,  Estimated Date of Delivery: 07/30/16 :                          2.  GDM, fair FBS control                        3.    Medication(s) Plans:  Start glyburide 2.5mg  qhs (if drops too much, try 1.25mg  qhs)  Treatment Plan: US 20-24-28-32-35-38//fetal echo  Twice weekly NST 32 weeks  IOL 39 weeks  *FBS were all out of range, but borderline.  Possibly will drop FBS too much w/meds, so we will review BS log next week and see if meds are really indicated.    NST/blood sugar log review on Tuesday  No orders of the defined types were placed in this encounter.  Orders Placed This Encounter  Procedures  . POCT urinalysis dipstick

## 2016-06-04 ENCOUNTER — Encounter: Payer: Self-pay | Admitting: Obstetrics and Gynecology

## 2016-06-04 ENCOUNTER — Ambulatory Visit (INDEPENDENT_AMBULATORY_CARE_PROVIDER_SITE_OTHER): Payer: BLUE CROSS/BLUE SHIELD | Admitting: Obstetrics and Gynecology

## 2016-06-04 VITALS — BP 128/76 | HR 114 | Wt 244.0 lb

## 2016-06-04 DIAGNOSIS — O24419 Gestational diabetes mellitus in pregnancy, unspecified control: Secondary | ICD-10-CM | POA: Diagnosis not present

## 2016-06-04 DIAGNOSIS — O0993 Supervision of high risk pregnancy, unspecified, third trimester: Secondary | ICD-10-CM

## 2016-06-04 DIAGNOSIS — Z331 Pregnant state, incidental: Secondary | ICD-10-CM

## 2016-06-04 DIAGNOSIS — Z8759 Personal history of other complications of pregnancy, childbirth and the puerperium: Secondary | ICD-10-CM

## 2016-06-04 DIAGNOSIS — O0990 Supervision of high risk pregnancy, unspecified, unspecified trimester: Secondary | ICD-10-CM

## 2016-06-04 DIAGNOSIS — Z1389 Encounter for screening for other disorder: Secondary | ICD-10-CM

## 2016-06-04 DIAGNOSIS — Z3A32 32 weeks gestation of pregnancy: Secondary | ICD-10-CM

## 2016-06-04 DIAGNOSIS — O09523 Supervision of elderly multigravida, third trimester: Secondary | ICD-10-CM | POA: Diagnosis not present

## 2016-06-04 LAB — POCT URINALYSIS DIPSTICK
Blood, UA: NEGATIVE
Glucose, UA: NEGATIVE
KETONES UA: NEGATIVE
LEUKOCYTES UA: NEGATIVE
Nitrite, UA: NEGATIVE
PROTEIN UA: NEGATIVE

## 2016-06-04 NOTE — Progress Notes (Addendum)
High Risk Pregnancy HROB Diagnosis(es):   A2 DM, AMA,  [id number] 2665w0d Estimated Date of Delivery: 07/30/16    HPI: The patient is being seen today for ongoing management of HEENT diabetes with NST, andREVIEW OF CBGs. Today she reports blood sugars in excellent since she was begun on glyburide 2.5 mg Patient reports good fetal movement, denies any bleeding and no rupture of membranes symptoms or regular contractions.   BP weight and urine results reviewed and noted. Blood pressure 128/76, pulse (!) 114, weight 244 lb (110.7 kg), last menstrual period 10/18/2015, currently breastfeeding.  Fundal Height: 34 Fetal Heart rate:  145, reactive contractions Physical Examination: Abdomen - soft, nontender, nondistended, no masses or organomegaly                                     Pelvic -                                      Edema:    Urinalysis:NEGATIVE for glucose protein                 POSITIVE for negative  Fetal Surveillance Testing today:  NST reactive  Lab and sonogram results have been reviewed. Comments: normal  Assessment:  1.  Pregnancy at 4765w0d,  [id number]   :                          2.  eactive NST                        3. Optimal CBG control            etal malpresentation today baby's back up transverse Medication(s) Plans:  Continue glyburide 2.5 mg twice a day  Treatment Plan:  twice weekly testing until delivery at 39 weeks We'll monitor for position change consider version if still non-vertex at 36 weeks Follow up in 0.5 weeks for appointment for high risk OB care,

## 2016-06-06 ENCOUNTER — Other Ambulatory Visit: Payer: Self-pay | Admitting: Obstetrics and Gynecology

## 2016-06-06 DIAGNOSIS — O24414 Gestational diabetes mellitus in pregnancy, insulin controlled: Secondary | ICD-10-CM

## 2016-06-06 DIAGNOSIS — O24410 Gestational diabetes mellitus in pregnancy, diet controlled: Secondary | ICD-10-CM

## 2016-06-07 ENCOUNTER — Encounter: Payer: Self-pay | Admitting: Obstetrics and Gynecology

## 2016-06-07 ENCOUNTER — Ambulatory Visit (INDEPENDENT_AMBULATORY_CARE_PROVIDER_SITE_OTHER): Payer: BLUE CROSS/BLUE SHIELD

## 2016-06-07 ENCOUNTER — Ambulatory Visit (INDEPENDENT_AMBULATORY_CARE_PROVIDER_SITE_OTHER): Payer: BLUE CROSS/BLUE SHIELD | Admitting: Obstetrics and Gynecology

## 2016-06-07 VITALS — BP 142/86 | HR 96 | Wt 242.0 lb

## 2016-06-07 DIAGNOSIS — K900 Celiac disease: Secondary | ICD-10-CM

## 2016-06-07 DIAGNOSIS — O24419 Gestational diabetes mellitus in pregnancy, unspecified control: Secondary | ICD-10-CM

## 2016-06-07 DIAGNOSIS — O0990 Supervision of high risk pregnancy, unspecified, unspecified trimester: Secondary | ICD-10-CM

## 2016-06-07 DIAGNOSIS — O24410 Gestational diabetes mellitus in pregnancy, diet controlled: Secondary | ICD-10-CM

## 2016-06-07 DIAGNOSIS — Z1389 Encounter for screening for other disorder: Secondary | ICD-10-CM

## 2016-06-07 DIAGNOSIS — Z3A32 32 weeks gestation of pregnancy: Secondary | ICD-10-CM

## 2016-06-07 DIAGNOSIS — Z331 Pregnant state, incidental: Secondary | ICD-10-CM

## 2016-06-07 LAB — POCT URINALYSIS DIPSTICK
Blood, UA: NEGATIVE
Glucose, UA: NEGATIVE
KETONES UA: NEGATIVE
LEUKOCYTES UA: NEGATIVE
Nitrite, UA: NEGATIVE
PROTEIN UA: NEGATIVE

## 2016-06-07 NOTE — Progress Notes (Signed)
US 32+3 wks,cephalic,post pl g 0,normal ov's bilat,afi 13.2 cm,fhr 130 bpm,BPP 8/8

## 2016-06-07 NOTE — Progress Notes (Signed)
Fetal Surveillance Testing today:  BPP   High Risk Pregnancy Diagnosis(es):   A2 DM, AMA  [id number] 764w3d Estimated Date of Delivery: 07/30/16  Last menstrual period 10/18/2015, currently breastfeeding.  Urinalysis: Negative   HPI: The patient is being seen today for ongoing management of the above. Pt states her BS have been fairly well controlled. Today she has no acute complaints or symptoms.    BP weight and urine results all reviewed and noted. Patient reports good fetal movement, denies any bleeding and no rupture of membranes symptoms or regular contractions.  Fundal Height:  35cm Fetal Heart rate:  132 bpm per BPP Edema: none  Patient is without complaints other than noted in her HPI. All questions were answered.  All lab and sonogram results have been reviewed. Comments: normal  Assessment:  1.  Pregnancy at 454w3d,  Estimated Date of Delivery: 07/30/16                         2.  Optimal CBG control                           Medication(s) Plans:   Continue glyburide 2.5 mg twice a day  Treatment Plan:  twice weekly testing until delivery at 39 weeks                            efw 38 wk  Follow up in 4 days for appointment for high risk OB care  By signing my name below, I, Freida Busmaniana Omoyeni, attest that this documentation has been prepared under the direction and in the presence of Tilda BurrowJohn V Chiann Goffredo, MD . Electronically Signed: Freida Busmaniana Omoyeni, Scribe. 06/07/2016. 1:16 PM. I personally performed the services described in this documentation, which was SCRIBED in my presence. The recorded information has been reviewed and considered accurate. It has been edited as necessary during review. Tilda BurrowFERGUSON,Beckham Capistran V, MD

## 2016-06-11 ENCOUNTER — Ambulatory Visit (INDEPENDENT_AMBULATORY_CARE_PROVIDER_SITE_OTHER): Payer: BLUE CROSS/BLUE SHIELD | Admitting: Women's Health

## 2016-06-11 ENCOUNTER — Encounter: Payer: Self-pay | Admitting: Women's Health

## 2016-06-11 VITALS — BP 110/80 | HR 80 | Wt 243.6 lb

## 2016-06-11 DIAGNOSIS — Z331 Pregnant state, incidental: Secondary | ICD-10-CM

## 2016-06-11 DIAGNOSIS — O24419 Gestational diabetes mellitus in pregnancy, unspecified control: Secondary | ICD-10-CM

## 2016-06-11 DIAGNOSIS — Z1389 Encounter for screening for other disorder: Secondary | ICD-10-CM

## 2016-06-11 DIAGNOSIS — O0990 Supervision of high risk pregnancy, unspecified, unspecified trimester: Secondary | ICD-10-CM

## 2016-06-11 LAB — POCT URINALYSIS DIPSTICK
GLUCOSE UA: NEGATIVE
KETONES UA: NEGATIVE
Leukocytes, UA: NEGATIVE
Nitrite, UA: NEGATIVE
PROTEIN UA: NEGATIVE
RBC UA: NEGATIVE

## 2016-06-11 NOTE — Patient Instructions (Addendum)
[facility] at [facility]: [phone]  Call the office [phone]) or go to [facility] if:  You begin to have strong, frequent contractions  Your water breaks.  Sometimes it is a big gush of fluid, sometimes it is just a trickle that keeps getting your panties wet or running down your legs  You have vaginal bleeding.  It is normal to have a small amount of spotting if your cervix was checked.   You don't feel your baby moving like normal.  If you don't, get you something to eat and drink and lay down and focus on feeling your baby move.  You should feel at least 10 movements in 2 hours.  If you don't, you should call the office or go to [facility].    Tdap Vaccine  It is recommended that you get the Tdap vaccine during the third trimester of EACH pregnancy to help protect your baby from getting pertussis (whooping cough)  27-36 weeks is the BEST time to do this so that you can pass the protection on to your baby. During pregnancy is better than after pregnancy, but if you are unable to get it during pregnancy it will be offered at the hospital.   You can get this vaccine at the health department or your family doctor  Everyone who will be around your baby should also be up-to-date on their vaccines. Adults (who are not pregnant) only need 1 dose of Tdap during adulthood.    Preterm Labor and Birth Information The normal length of a pregnancy is 39-41 weeks. Preterm labor is when labor starts before 37 completed weeks of pregnancy. What are the risk factors for preterm labor? Preterm labor is more likely to occur in women who:  Have certain infections during pregnancy such as a bladder infection, sexually transmitted infection, or infection inside the uterus (chorioamnionitis).  Have a shorter-than-normal cervix.  Have gone into preterm labor before.  Have had surgery on their cervix.  Are younger than age 36 or older than age 36.  Are African  American.  Are pregnant with twins or multiple babies (multiple gestation).  Take street drugs or smoke while pregnant.  Do not gain enough weight while pregnant.  Became pregnant shortly after having been pregnant. What are the symptoms of preterm labor? Symptoms of preterm labor include:  Cramps similar to those that can happen during a menstrual period. The cramps may happen with diarrhea.  Pain in the abdomen or lower back.  Regular uterine contractions that may feel like tightening of the abdomen.  A feeling of increased pressure in the pelvis.  Increased watery or bloody mucus discharge from the vagina.  Water breaking (ruptured amniotic sac). Why is it important to recognize signs of preterm labor? It is important to recognize signs of preterm labor because babies who are born prematurely may not be fully developed. This can put them at an increased risk for:  Long-term (chronic) heart and lung problems.  Difficulty immediately after birth with regulating body systems, including blood sugar, body temperature, heart rate, and breathing rate.  Bleeding in the brain.  Cerebral palsy.  Learning difficulties.  Death. These risks are highest for babies who are born before 34 weeks of pregnancy. How is preterm labor treated? Treatment depends on the length of your pregnancy, your condition, and the health of your baby. It may involve:  Having a stitch (suture) placed in your cervix to prevent your cervix from opening too early (cerclage).  Taking or  being given medicines, such as:  Hormone medicines. These may be given early in pregnancy to help support the pregnancy.  Medicine to stop contractions.  Medicines to help mature the baby's lungs. These may be prescribed if the risk of delivery is high.  Medicines to prevent your baby from developing cerebral palsy. If the labor happens before 34 weeks of pregnancy, you may need to stay in the hospital. What should I  do if I think I am in preterm labor? If you think that you are going into preterm labor, call your health care provider right away. How can I prevent preterm labor in future pregnancies? To increase your chance of having a full-term pregnancy:  Do not use any tobacco products, such as cigarettes, chewing tobacco, and e-cigarettes. If you need help quitting, ask your health care provider.  Do not use street drugs or medicines that have not been prescribed to you during your pregnancy.  Talk with your health care provider before taking any herbal supplements, even if you have been taking them regularly.  Make sure you gain a healthy amount of weight during your pregnancy.  Watch for infection. If you think that you might have an infection, get it checked right away.  Make sure to tell your health care provider if you have gone into preterm labor before. This information is not intended to replace advice given to you by your health care provider. Make sure you discuss any questions you have with your health care provider. Document Released: 06/01/2003 Document Revised: 08/22/2015 Document Reviewed: 08/02/2015 Elsevier Interactive Patient Education  2017 [employer].

## 2016-06-11 NOTE — Progress Notes (Signed)
High Risk Pregnancy Diagnosis(es): A2DM [id number] 1861w0d Estimated Date of Delivery: 07/30/16 BP 110/80   Pulse 80   Wt 243 lb 9.6 oz (110.5 kg)   LMP 10/18/2015 (Approximate)   BMI 38.15 kg/m   Urinalysis: Negative HPI:  fbs all <90, 2hr 85-145 (only 2>120) BP, weight, and urine reviewed.  Reports good fm. Denies regular uc's, lof, vb, uti s/s. No complaints.  Fundal Height:  34 Fetal Heart rate:  120, reactive nst Edema:  none  Reviewed ptl s/s, fkc, Recommended Tdap at HD/PCP per CDC guidelines.  All questions were answered Assessment: 10061w0d A2DM Medication(s) Plans:  Continue glyburide 2.5mg  qhs, baby asa daily Treatment Plan:  Growth u/s @ 38wks     2x/wk testing @ 32wks    Deliver @ 39wks Follow up in 3d for high-risk OB appt and NST

## 2016-06-12 ENCOUNTER — Encounter: Payer: BLUE CROSS/BLUE SHIELD | Admitting: Women's Health

## 2016-06-12 ENCOUNTER — Encounter: Payer: Self-pay | Admitting: Women's Health

## 2016-06-13 ENCOUNTER — Other Ambulatory Visit: Payer: Self-pay | Admitting: Women's Health

## 2016-06-13 DIAGNOSIS — O24410 Gestational diabetes mellitus in pregnancy, diet controlled: Secondary | ICD-10-CM

## 2016-06-13 DIAGNOSIS — O09523 Supervision of elderly multigravida, third trimester: Secondary | ICD-10-CM

## 2016-06-14 ENCOUNTER — Ambulatory Visit (INDEPENDENT_AMBULATORY_CARE_PROVIDER_SITE_OTHER): Payer: BLUE CROSS/BLUE SHIELD

## 2016-06-14 ENCOUNTER — Ambulatory Visit (INDEPENDENT_AMBULATORY_CARE_PROVIDER_SITE_OTHER): Payer: BLUE CROSS/BLUE SHIELD | Admitting: Obstetrics & Gynecology

## 2016-06-14 ENCOUNTER — Encounter: Payer: Self-pay | Admitting: Women's Health

## 2016-06-14 ENCOUNTER — Encounter: Payer: Self-pay | Admitting: Obstetrics & Gynecology

## 2016-06-14 VITALS — BP 118/70 | HR 96

## 2016-06-14 DIAGNOSIS — Z1389 Encounter for screening for other disorder: Secondary | ICD-10-CM

## 2016-06-14 DIAGNOSIS — K900 Celiac disease: Secondary | ICD-10-CM

## 2016-06-14 DIAGNOSIS — O09523 Supervision of elderly multigravida, third trimester: Secondary | ICD-10-CM

## 2016-06-14 DIAGNOSIS — O0990 Supervision of high risk pregnancy, unspecified, unspecified trimester: Secondary | ICD-10-CM

## 2016-06-14 DIAGNOSIS — O24419 Gestational diabetes mellitus in pregnancy, unspecified control: Secondary | ICD-10-CM

## 2016-06-14 DIAGNOSIS — O24410 Gestational diabetes mellitus in pregnancy, diet controlled: Secondary | ICD-10-CM

## 2016-06-14 DIAGNOSIS — Z331 Pregnant state, incidental: Secondary | ICD-10-CM

## 2016-06-14 LAB — POCT URINALYSIS DIPSTICK
Blood, UA: NEGATIVE
Glucose, UA: NEGATIVE
KETONES UA: NEGATIVE
NITRITE UA: NEGATIVE
PROTEIN UA: NEGATIVE

## 2016-06-14 NOTE — Progress Notes (Signed)
US 33+3 wks,cephalic,fhr 145 bpm,cx 3.4 cm,post pl gr 0,afi 14.9 cm,BPP 8/8

## 2016-06-14 NOTE — Progress Notes (Signed)
Fetal Surveillance Testing today:  BPP 8/8 with normal Doppler flow   High Risk Pregnancy Diagnosis(es):   Class A2 DM  [id number] 8418w3d Estimated Date of Delivery: 07/30/16  Blood pressure 118/70, pulse 96, last menstrual period 10/18/2015, currently breastfeeding.  Urinalysis: Negative   HPI: The patient is being seen today for ongoing management of Class A2 DM. Today she reports CBG overall are good, a couple are elevated slightly   BP weight and urine results all reviewed and noted. Patient reports good fetal movement, denies any bleeding and no rupture of membranes symptoms or regular contractions.  Fundal Height:   Fetal Heart rate:  136 Edema:  none  Patient is without complaints other than noted in her HPI. All questions were answered.  All lab and sonogram results have been reviewed. Comments:    Assessment:  1.  Pregnancy at 2518w3d,  Estimated Date of Delivery: 07/30/16 :                          2.  Class A2 DM                        3.    Medication(s) Plans:  Glyburide 2.5 qhs  Treatment Plan:  Twice weekly NST with EFW 37 weeks or so, delivery 39 weeks, there was some confusion regarding sonogram alternating with NST.  At this time she does not have either a hypertensive disorder of pregnancy or FGR as a result, for now unless other developments occur, twice weekly NST are appropriate with an EFW at 36-37 weeks  Return in about 3 days (around 06/17/2016) for NST, HROB. for appointment for high risk OB care  No orders of the defined types were placed in this encounter.  Orders Placed This Encounter  Procedures  . POCT urinalysis dipstick

## 2016-06-17 ENCOUNTER — Ambulatory Visit (INDEPENDENT_AMBULATORY_CARE_PROVIDER_SITE_OTHER): Payer: BLUE CROSS/BLUE SHIELD | Admitting: Obstetrics & Gynecology

## 2016-06-17 ENCOUNTER — Encounter: Payer: Self-pay | Admitting: Obstetrics & Gynecology

## 2016-06-17 VITALS — BP 132/84 | HR 110 | Wt 245.0 lb

## 2016-06-17 DIAGNOSIS — O0990 Supervision of high risk pregnancy, unspecified, unspecified trimester: Secondary | ICD-10-CM

## 2016-06-17 DIAGNOSIS — Z331 Pregnant state, incidental: Secondary | ICD-10-CM

## 2016-06-17 DIAGNOSIS — O24419 Gestational diabetes mellitus in pregnancy, unspecified control: Secondary | ICD-10-CM

## 2016-06-17 DIAGNOSIS — Z1389 Encounter for screening for other disorder: Secondary | ICD-10-CM

## 2016-06-17 NOTE — Progress Notes (Signed)
Fetal Surveillance Testing today:  Reactive NST   High Risk Pregnancy Diagnosis(es):   Class A2 DM  [id number] 5351w6d Estimated Date of Delivery: 07/30/16  Blood pressure 132/84, pulse (!) 110, weight 245 lb (111.1 kg), last menstrual period 10/18/2015, currently breastfeeding.  Urinalysis: Negative   HPI: The patient is being seen today for ongoing management of as above. Today she reports CBG are good   BP weight and urine results all reviewed and noted. Patient reports good fetal movement, denies any bleeding and no rupture of membranes symptoms or regular contractions.  Fundal Height:  36 Fetal Heart rate:  135 Edema:  none  Patient is without complaints other than noted in her HPI. All questions were answered.  All lab and sonogram results have been reviewed. Comments:    Assessment:  1.  Pregnancy at 5851w6d,  Estimated Date of Delivery: 07/30/16 :                          2.  Class A2 DM                        3.    Medication(s) Plans:  Glyburide 2.5 mg qhs  Treatment Plan:  Twice weekly NST, EFW 36-37 weeks, deliver 39 weeks or as clinically indicated  Return in about 3 days (around 06/20/2016) for NST, HROB. for appointment for high risk OB care  No orders of the defined types were placed in this encounter.  Orders Placed This Encounter  Procedures  . POCT urinalysis dipstick

## 2016-06-18 ENCOUNTER — Other Ambulatory Visit: Payer: BLUE CROSS/BLUE SHIELD | Admitting: Obstetrics & Gynecology

## 2016-06-20 ENCOUNTER — Other Ambulatory Visit: Payer: BLUE CROSS/BLUE SHIELD

## 2016-06-20 ENCOUNTER — Encounter: Payer: Self-pay | Admitting: Obstetrics and Gynecology

## 2016-06-20 ENCOUNTER — Encounter: Payer: BLUE CROSS/BLUE SHIELD | Admitting: Obstetrics and Gynecology

## 2016-06-20 ENCOUNTER — Ambulatory Visit (INDEPENDENT_AMBULATORY_CARE_PROVIDER_SITE_OTHER): Payer: BLUE CROSS/BLUE SHIELD | Admitting: Obstetrics and Gynecology

## 2016-06-20 VITALS — BP 120/68 | HR 96 | Wt 244.8 lb

## 2016-06-20 DIAGNOSIS — Z331 Pregnant state, incidental: Secondary | ICD-10-CM | POA: Diagnosis not present

## 2016-06-20 DIAGNOSIS — O0990 Supervision of high risk pregnancy, unspecified, unspecified trimester: Secondary | ICD-10-CM

## 2016-06-20 DIAGNOSIS — Z3A34 34 weeks gestation of pregnancy: Secondary | ICD-10-CM

## 2016-06-20 DIAGNOSIS — Z1389 Encounter for screening for other disorder: Secondary | ICD-10-CM

## 2016-06-20 DIAGNOSIS — O24419 Gestational diabetes mellitus in pregnancy, unspecified control: Secondary | ICD-10-CM

## 2016-06-20 DIAGNOSIS — O09893 Supervision of other high risk pregnancies, third trimester: Secondary | ICD-10-CM

## 2016-06-20 NOTE — Progress Notes (Signed)
High Risk Pregnancy HROB Diagnosis(es):   Class A2 DM  [id number] 3142w2d Estimated Date of Delivery: 07/30/16    HPI: The patient is being seen today for ongoing management of above. Today she has no complaints.  Patient reports good fetal movement, denies any bleeding and no rupture of membranes symptoms or regular contractions.   BP weight and urine results reviewed and noted. Blood pressure 120/68, pulse 96, weight 244 lb 12.8 oz (111 kg), last menstrual period 10/18/2015, currently breastfeeding.  Fundal Height:   Fetal Heart rate:  142 Physical Examination: Abdomen - soft, nontender, nondistended, no masses or organomegaly                                     Pelvic - examination not indicated                                     Edema:  none  Urinalysis:  Fetal Surveillance Testing today:  NST, reactive  Lab and sonogram results have been reviewed. Comments:   Assessment:  1.  Pregnancy at 6042w2d,  [id number]   :  Estimated Date of Delivery: 07/30/16                         2.  Class A2 DM                        3. Reactive NST  Medication(s) Plans:  Glyburide 2.5 mg qhs  Treatment Plan:  Twice weekly NST  Follow up in 5 days for appointment for high risk OB care and NST  By signing my name below, I, Sonum Patel, attest that this documentation has been prepared under the direction and in the presence of Tilda BurrowJohn Parthiv Mucci V, MD. Electronically Signed: Sonum Patel, Scribe. 06/20/16. 2:41 PM.  I personally performed the services described in this documentation, which was SCRIBED in my presence. The recorded information has been reviewed and considered accurate. It has been edited as necessary during review. Tilda BurrowFERGUSON,Barnaby Rippeon V, MD

## 2016-06-25 ENCOUNTER — Ambulatory Visit (INDEPENDENT_AMBULATORY_CARE_PROVIDER_SITE_OTHER): Payer: BLUE CROSS/BLUE SHIELD | Admitting: Obstetrics & Gynecology

## 2016-06-25 ENCOUNTER — Encounter: Payer: Self-pay | Admitting: Obstetrics & Gynecology

## 2016-06-25 VITALS — BP 120/60 | HR 80 | Wt 247.4 lb

## 2016-06-25 DIAGNOSIS — O24419 Gestational diabetes mellitus in pregnancy, unspecified control: Secondary | ICD-10-CM | POA: Diagnosis not present

## 2016-06-25 DIAGNOSIS — Z1389 Encounter for screening for other disorder: Secondary | ICD-10-CM

## 2016-06-25 DIAGNOSIS — O0990 Supervision of high risk pregnancy, unspecified, unspecified trimester: Secondary | ICD-10-CM

## 2016-06-25 DIAGNOSIS — Z331 Pregnant state, incidental: Secondary | ICD-10-CM

## 2016-06-25 LAB — POCT URINALYSIS DIPSTICK
Blood, UA: NEGATIVE
GLUCOSE UA: NEGATIVE
KETONES UA: NEGATIVE
LEUKOCYTES UA: NEGATIVE
Nitrite, UA: NEGATIVE
PROTEIN UA: NEGATIVE

## 2016-06-25 NOTE — Progress Notes (Signed)
Fetal Surveillance Testing today:  Reactive NST   High Risk Pregnancy Diagnosis(es):   Class A2 DM  [id number] [redacted]w[redacted]d Estimated Date of Delivery: 07/30/16  Blood pressure 120/60, pulse 80, weight 247 lb 6.4 oz (112.2 kg), last menstrual period 10/18/2015, currently breastfeeding.  Urinalysis: Negative   HPI: The patient is being seen today for ongoing management of as above. Today she reports CBG are overall good a couple a minimally high   BP weight and urine results all reviewed and noted. Patient reports good fetal movement, denies any bleeding and no rupture of membranes symptoms or regular contractions.  Fundal Height:  39 Fetal Heart rate:  135 Edema:  none  Patient is without complaints other than noted in her HPI. All questions were answered.  All lab and sonogram results have been reviewed. Comments:    Assessment:  1.  Pregnancy at [redacted]w[redacted]d,  Estimated Date of Delivery: 07/30/16 :                          2.  Class A2 DM                        3.  Hx of GHTN x 2  Medication(s) Plans:  Glyburide 2.5 mg qhs  Treatment Plan:  Twice weekly NST, sonogram 36-37 weeks to check EFW, induction 39 weeks unless otherwise indicated  Return in about 3 days (around 06/28/2016) for NST, HROB. for appointment for high risk OB care  No orders of the defined types were placed in this encounter.  Orders Placed This Encounter  Procedures  . POCT urinalysis dipstick

## 2016-06-28 ENCOUNTER — Ambulatory Visit (INDEPENDENT_AMBULATORY_CARE_PROVIDER_SITE_OTHER): Payer: BLUE CROSS/BLUE SHIELD | Admitting: Women's Health

## 2016-06-28 ENCOUNTER — Other Ambulatory Visit: Payer: BLUE CROSS/BLUE SHIELD

## 2016-06-28 ENCOUNTER — Encounter: Payer: BLUE CROSS/BLUE SHIELD | Admitting: Women's Health

## 2016-06-28 ENCOUNTER — Encounter: Payer: Self-pay | Admitting: Women's Health

## 2016-06-28 VITALS — BP 120/80 | HR 80 | Wt 250.0 lb

## 2016-06-28 DIAGNOSIS — O24419 Gestational diabetes mellitus in pregnancy, unspecified control: Secondary | ICD-10-CM

## 2016-06-28 DIAGNOSIS — O0990 Supervision of high risk pregnancy, unspecified, unspecified trimester: Secondary | ICD-10-CM

## 2016-06-28 DIAGNOSIS — Z1389 Encounter for screening for other disorder: Secondary | ICD-10-CM

## 2016-06-28 DIAGNOSIS — O0993 Supervision of high risk pregnancy, unspecified, third trimester: Secondary | ICD-10-CM

## 2016-06-28 DIAGNOSIS — Z331 Pregnant state, incidental: Secondary | ICD-10-CM

## 2016-06-28 LAB — POCT URINALYSIS DIPSTICK
Glucose, UA: NEGATIVE
KETONES UA: NEGATIVE
Leukocytes, UA: NEGATIVE
Nitrite, UA: NEGATIVE
Protein, UA: NEGATIVE

## 2016-06-28 NOTE — Progress Notes (Signed)
High Risk Pregnancy Diagnosis(es): A2DM [id number] [redacted]w[redacted]d Estimated Date of Delivery: 07/30/16 BP 120/80   Pulse 80   Wt 250 lb (113.4 kg)   LMP 10/18/2015 (Approximate)   BMI 39.16 kg/m   Urinalysis: Negative HPI:  All fbs <90, all 2hr pp <120. Some headaches, go away w/ caffeine BP, weight, and urine reviewed.  Reports good fm. Denies regular uc's, lof, vb, uti s/s. \  Fundal Height:  35 Fetal Heart rate:  145, reactive NST Edema:  none  Reviewed ptl s/s, fkc All questions were answered Assessment: [redacted]w[redacted]d A2DM Medication(s) Plans:  Glyburide 2.5mg  qhs  Treatment Plan:  Growth u/s @  38wks     2x/wk testing @ 32wks    Deliver @ 39wks Follow up on Tues for high-risk OB appt and NST

## 2016-06-28 NOTE — Patient Instructions (Signed)
Call the office [phone]) or go to [facility] if:  You begin to have strong, frequent contractions  Your water breaks.  Sometimes it is a big gush of fluid, sometimes it is just a trickle that keeps getting your panties wet or running down your legs  You have vaginal bleeding.  It is normal to have a small amount of spotting if your cervix was checked.   You don't feel your baby moving like normal.  If you don't, get you something to eat and drink and lay down and focus on feeling your baby move.  You should feel at least 10 movements in 2 hours.  If you don't, you should call the office or go to [facility].    For Headaches:   Stay well hydrated, drink enough water so that your urine is clear, sometimes if you are dehydrated you can get headaches  Eat small frequent meals and snacks, sometimes if you are hungry you can get headaches  Sometimes you get headaches during pregnancy from the pregnancy hormones  You can try tylenol (1-2 regular strength  or 1-2 extra strength ) as directed on the box. The least amount of medication that works is best.   Cool compresses (cool wet washcloth or ice pack) to area of head that is hurting  You can also try drinking a caffeinated drink to see if this will help  If not helping, try below:  For Prevention of Headaches/Migraines:  CoQ10  three times daily  Vitamin B2  daily  Magnesium Oxide 400-600mg  daily  If You Get a Bad Headache/Migraine:  Benadryl    Magnesium Oxide  1 large Gatorade  2 extra strength Tylenol (1,000mg  total)  1 cup coffee or Coke  If this doesn't help please call us @ [phone]    Preterm Labor and Birth Information The normal length of a pregnancy is 39-41 weeks. Preterm labor is when labor starts before 37 completed weeks of pregnancy. What are the risk factors for preterm labor? Preterm labor is more likely to occur in women who:  Have certain infections during  pregnancy such as a bladder infection, sexually transmitted infection, or infection inside the uterus (chorioamnionitis).  Have a shorter-than-normal cervix.  Have gone into preterm labor before.  Have had surgery on their cervix.  Are younger than age 67 or older than age 7.  Are African American.  Are pregnant with twins or multiple babies (multiple gestation).  Take street drugs or smoke while pregnant.  Do not gain enough weight while pregnant.  Became pregnant shortly after having been pregnant. What are the symptoms of preterm labor? Symptoms of preterm labor include:  Cramps similar to those that can happen during a menstrual period. The cramps may happen with diarrhea.  Pain in the abdomen or lower back.  Regular uterine contractions that may feel like tightening of the abdomen.  A feeling of increased pressure in the pelvis.  Increased watery or bloody mucus discharge from the vagina.  Water breaking (ruptured amniotic sac). Why is it important to recognize signs of preterm labor? It is important to recognize signs of preterm labor because babies who are born prematurely may not be fully developed. This can put them at an increased risk for:  Long-term (chronic) heart and lung problems.  Difficulty immediately after birth with regulating body systems, including blood sugar, body temperature, heart rate, and breathing rate.  Bleeding in the brain.  Cerebral palsy.  Learning difficulties.  Death. These risks are  highest for babies who are born before 34 weeks of pregnancy. How is preterm labor treated? Treatment depends on the length of your pregnancy, your condition, and the health of your baby. It may involve:  Having a stitch (suture) placed in your cervix to prevent your cervix from opening too early (cerclage).  Taking or being given medicines, such as:  Hormone medicines. These may be given early in pregnancy to help support the  pregnancy.  Medicine to stop contractions.  Medicines to help mature the baby's lungs. These may be prescribed if the risk of delivery is high.  Medicines to prevent your baby from developing cerebral palsy. If the labor happens before 34 weeks of pregnancy, you may need to stay in the hospital. What should I do if I think I am in preterm labor? If you think that you are going into preterm labor, call your health care provider right away. How can I prevent preterm labor in future pregnancies? To increase your chance of having a full-term pregnancy:  Do not use any tobacco products, such as cigarettes, chewing tobacco, and e-cigarettes. If you need help quitting, ask your health care provider.  Do not use street drugs or medicines that have not been prescribed to you during your pregnancy.  Talk with your health care provider before taking any herbal supplements, even if you have been taking them regularly.  Make sure you gain a healthy amount of weight during your pregnancy.  Watch for infection. If you think that you might have an infection, get it checked right away.  Make sure to tell your health care provider if you have gone into preterm labor before. This information is not intended to replace advice given to you by your health care provider. Make sure you discuss any questions you have with your health care provider. Document Released: 06/01/2003 Document Revised: 08/22/2015 Document Reviewed: 08/02/2015 Elsevier Interactive Patient Education  2017 [employer].

## 2016-07-02 ENCOUNTER — Ambulatory Visit (INDEPENDENT_AMBULATORY_CARE_PROVIDER_SITE_OTHER): Payer: BLUE CROSS/BLUE SHIELD | Admitting: Obstetrics & Gynecology

## 2016-07-02 ENCOUNTER — Encounter: Payer: Self-pay | Admitting: Obstetrics & Gynecology

## 2016-07-02 VITALS — BP 110/64 | HR 110 | Wt 254.0 lb

## 2016-07-02 DIAGNOSIS — Z331 Pregnant state, incidental: Secondary | ICD-10-CM

## 2016-07-02 DIAGNOSIS — O0993 Supervision of high risk pregnancy, unspecified, third trimester: Secondary | ICD-10-CM

## 2016-07-02 DIAGNOSIS — O24419 Gestational diabetes mellitus in pregnancy, unspecified control: Secondary | ICD-10-CM

## 2016-07-02 DIAGNOSIS — Z1389 Encounter for screening for other disorder: Secondary | ICD-10-CM

## 2016-07-02 LAB — POCT URINALYSIS DIPSTICK
Glucose, UA: NEGATIVE
Glucose, UA: NEGATIVE
Glucose, UA: NEGATIVE
KETONES UA: NEGATIVE
Nitrite, UA: NEGATIVE
PROTEIN UA: NEGATIVE
PROTEIN UA: NEGATIVE
Protein, UA: NEGATIVE
RBC UA: NEGATIVE

## 2016-07-02 MED ORDER — GLYBURIDE 2.5 MG PO TABS: 2.5000 mg | ORAL_TABLET | Freq: Two times a day (BID) | ORAL | 3 refills | Status: DC

## 2016-07-02 NOTE — Progress Notes (Signed)
Fetal Surveillance Testing today:  Reactive NST   High Risk Pregnancy Diagnosis(es):   Class A2 DM  [id number] [redacted]w[redacted]d Estimated Date of Delivery: 07/30/16  Blood pressure 110/64, pulse (!) 110, weight 254 lb (115.2 kg), last menstrual period 10/18/2015, currently breastfeeding.  Urinalysis: Positive   HPI: The patient is being seen today for ongoing management of Class A2 DM. Today she reports CBG are a bit high in the evening   BP weight and urine results all reviewed and noted. Patient reports good fetal movement, denies any bleeding and no rupture of membranes symptoms or regular contractions.  Fundal Height:  38 Fetal Heart rate:  145 Edema:  none  Patient is without complaints other than noted in her HPI. All questions were answered.  All lab and sonogram results have been reviewed. Comments:    Assessment:  1.  Pregnancy at [redacted]w[redacted]d,  Estimated Date of Delivery: 07/30/16 :                          2.  Class A2 DM                        3.    Medication(s) Plans:  Increase glyburide 2.5 mg to BID  Treatment Plan:  Twice weekly surveillance, efw 36-37 weeks,  Deliver 39 weeks  Return in about 3 days (around 07/05/2016) for keep, already scheduled. for appointment for high risk OB care  Meds ordered this encounter  Medications  . glyBURIDE (DIABETA) 2.5 MG tablet    Sig: Take 1 tablet (2.5 mg total) by mouth 2 (two) times daily with a meal.    Dispense:  60 tablet    Refill:  3   Orders Placed This Encounter  Procedures  . POCT Urinalysis Dipstick

## 2016-07-03 ENCOUNTER — Other Ambulatory Visit: Payer: Self-pay | Admitting: Obstetrics & Gynecology

## 2016-07-03 DIAGNOSIS — O24419 Gestational diabetes mellitus in pregnancy, unspecified control: Secondary | ICD-10-CM

## 2016-07-03 DIAGNOSIS — O09523 Supervision of elderly multigravida, third trimester: Secondary | ICD-10-CM

## 2016-07-05 ENCOUNTER — Ambulatory Visit (INDEPENDENT_AMBULATORY_CARE_PROVIDER_SITE_OTHER): Payer: BLUE CROSS/BLUE SHIELD

## 2016-07-05 ENCOUNTER — Other Ambulatory Visit: Payer: Self-pay | Admitting: Obstetrics & Gynecology

## 2016-07-05 ENCOUNTER — Encounter: Payer: Self-pay | Admitting: Obstetrics and Gynecology

## 2016-07-05 ENCOUNTER — Ambulatory Visit (INDEPENDENT_AMBULATORY_CARE_PROVIDER_SITE_OTHER): Payer: BLUE CROSS/BLUE SHIELD | Admitting: Obstetrics and Gynecology

## 2016-07-05 VITALS — BP 122/80 | HR 88 | Wt 252.8 lb

## 2016-07-05 DIAGNOSIS — Z1389 Encounter for screening for other disorder: Secondary | ICD-10-CM

## 2016-07-05 DIAGNOSIS — O24419 Gestational diabetes mellitus in pregnancy, unspecified control: Secondary | ICD-10-CM | POA: Diagnosis not present

## 2016-07-05 DIAGNOSIS — O0990 Supervision of high risk pregnancy, unspecified, unspecified trimester: Secondary | ICD-10-CM

## 2016-07-05 DIAGNOSIS — Z331 Pregnant state, incidental: Secondary | ICD-10-CM

## 2016-07-05 DIAGNOSIS — K900 Celiac disease: Secondary | ICD-10-CM

## 2016-07-05 DIAGNOSIS — O09523 Supervision of elderly multigravida, third trimester: Secondary | ICD-10-CM

## 2016-07-05 LAB — POCT URINALYSIS DIPSTICK
Blood, UA: NEGATIVE
GLUCOSE UA: NEGATIVE
KETONES UA: NEGATIVE
LEUKOCYTES UA: NEGATIVE
NITRITE UA: NEGATIVE
Protein, UA: NEGATIVE

## 2016-07-05 NOTE — Progress Notes (Signed)
[place] 36+3 wks,cephalic,BPP 8/8,AFI 13 cm,post pl gr 1,fhr 140 bpm,EFW 3233 g 72%,AC 94%

## 2016-07-05 NOTE — Progress Notes (Signed)
Patient ID: Julia Johnson, female   DOB: Jun 19, 1980, 36 y.o.   MRN: [id number]   High Risk Pregnancy HROB Diagnosis(es):   A2 DM  [id number] [redacted]w[redacted]d Estimated Date of Delivery: 07/30/16     HPI: The patient is being seen today for ongoing management of the above.  Chief Complaint  Patient presents with  . Routine Prenatal Visit  ____  She reports she has been having occasional HAs and consuming caffeinated sodas as a result. She reports her BP is normal when she has HAs. Pt states since her glyburide was increased her CBGs have been better controlled. Pt reports her oldest child was >9 lbs and there is no h/o shoulder dystocia during delivery.   Patient reports good fetal movement. She denies any bleeding and no rupture of membranes symptoms or regular contractions. Pt here with  husband , children and family members.... BP weight and urine results reviewed and noted. Blood pressure 122/80, pulse 88, weight 252 lb 12.8 oz (114.7 kg), last menstrual period 10/18/2015, currently breastfeeding.  Fetal Heart rate:  140 bpm Physical Examination: Abdomen - soft, nontender, nondistended, no masses or organomegaly                                     Edema:  none  Urinalysis:NEGATIVE for all  Fetal Surveillance Testing today:  BPP 8/8  Lab and sonogram results have been reviewed.  Assessment:  1.  Pregnancy at [redacted]w[redacted]d,  [id number]   :  Estimated Date of Delivery: 07/30/16                         2.  A2 DM: Home CBGs 80s-110s with one abnormal fasting 7 hours after a late supper   Medication(s) Plans:  Glyburide 2.5 mg BID  Treatment Plan:   1. Twice weekly surveillance 2.  IOL @ 39 weeks- scheduled for 0730 on 07/23/16  Follow up in 4 days for appointment for high risk OB care, NST and GBS   By signing my name below, I, Doreatha Martin, attest that this documentation has been prepared under the direction and in the presence of Tilda Burrow, MD. Electronically Signed: Doreatha Martin, ED Scribe. 07/05/16.  11:56 AM.  I personally performed the services described in this documentation, which was SCRIBED in my presence. The recorded information has been reviewed and considered accurate. It has been edited as necessary during review. Tilda Burrow, MD

## 2016-07-09 ENCOUNTER — Encounter: Payer: Self-pay | Admitting: Obstetrics & Gynecology

## 2016-07-09 ENCOUNTER — Ambulatory Visit (INDEPENDENT_AMBULATORY_CARE_PROVIDER_SITE_OTHER): Payer: BLUE CROSS/BLUE SHIELD | Admitting: Obstetrics & Gynecology

## 2016-07-09 VITALS — BP 120/72 | HR 68 | Wt 253.0 lb

## 2016-07-09 DIAGNOSIS — Z331 Pregnant state, incidental: Secondary | ICD-10-CM

## 2016-07-09 DIAGNOSIS — Z1389 Encounter for screening for other disorder: Secondary | ICD-10-CM

## 2016-07-09 DIAGNOSIS — O24419 Gestational diabetes mellitus in pregnancy, unspecified control: Secondary | ICD-10-CM

## 2016-07-09 DIAGNOSIS — Z3483 Encounter for supervision of other normal pregnancy, third trimester: Secondary | ICD-10-CM | POA: Diagnosis not present

## 2016-07-09 DIAGNOSIS — Z3A37 37 weeks gestation of pregnancy: Secondary | ICD-10-CM

## 2016-07-09 DIAGNOSIS — O0990 Supervision of high risk pregnancy, unspecified, unspecified trimester: Secondary | ICD-10-CM

## 2016-07-09 LAB — POCT URINALYSIS DIPSTICK
Blood, UA: NEGATIVE
GLUCOSE UA: NEGATIVE
Ketones, UA: NEGATIVE
Leukocytes, UA: NEGATIVE
NITRITE UA: NEGATIVE
Protein, UA: NEGATIVE

## 2016-07-09 LAB — OB RESULTS CONSOLE GBS: STREP GROUP B AG: NEGATIVE

## 2016-07-09 NOTE — Progress Notes (Signed)
Fetal Surveillance Testing today:  Reactive NST   High Risk Pregnancy Diagnosis(es):   Class A2 DM, hx of hypertensive disorders of pregnancy  [id number] [redacted]w[redacted]d Estimated Date of Delivery: 07/30/16  Blood pressure 120/72, pulse 68, weight 253 lb (114.8 kg), last menstrual period 10/18/2015, currently breastfeeding.  Urinalysis: Negative   HPI: The patient is being seen today for ongoing management of as above. Today she reports CBG are in acceptable range   BP weight and urine results all reviewed and noted. Patient reports good fetal movement, denies any bleeding and no rupture of membranes symptoms or regular contractions.  Fundal Height:  39 Fetal Heart rate:  135 Edema:  none  Patient is without complaints other than noted in her HPI. All questions were answered.  All lab and sonogram results have been reviewed. Comments:    Assessment:  1.  Pregnancy at [redacted]w[redacted]d,  Estimated Date of Delivery: 07/30/16 :                          2.  Class A2 DM                        3.  Hx of GHTN x 2  Medication(s) Plans:  Baby ASA, gluburide 2.5 mg BID  Treatment Plan:  Twice weekly surveillance, induction 39 weeks  Return in about 3 days (around 07/12/2016) for NST, HROB. for appointment for high risk OB care  No orders of the defined types were placed in this encounter.  Orders Placed This Encounter  Procedures  . Strep Gp B NAA  . GC/Chlamydia Probe Amp  . POCT urinalysis dipstick

## 2016-07-11 ENCOUNTER — Encounter (HOSPITAL_COMMUNITY): Payer: Self-pay | Admitting: *Deleted

## 2016-07-11 ENCOUNTER — Inpatient Hospital Stay (HOSPITAL_COMMUNITY)
Admission: AD | Admit: 2016-07-11 | Discharge: 2016-07-11 | Disposition: A | Discharge status: Home / Self Care | Payer: BLUE CROSS/BLUE SHIELD | Source: Ambulatory Visit | Attending: Obstetrics and Gynecology | Admitting: Obstetrics and Gynecology

## 2016-07-11 DIAGNOSIS — Z3A00 Weeks of gestation of pregnancy not specified: Secondary | ICD-10-CM | POA: Diagnosis not present

## 2016-07-11 DIAGNOSIS — O479 False labor, unspecified: Secondary | ICD-10-CM | POA: Diagnosis not present

## 2016-07-11 LAB — GC/CHLAMYDIA PROBE AMP
Chlamydia trachomatis, NAA: NEGATIVE
Neisseria gonorrhoeae by PCR: NEGATIVE

## 2016-07-11 LAB — STREP GP B NAA: Strep Gp B NAA: NEGATIVE

## 2016-07-11 NOTE — OB Triage Note (Signed)
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  It has been documented that patient is contracting occasionally with no cervical change in 2 days  not indicating active labor.  Patient denies any other complaints.  Based on this report provider has given order for discharge.  A discharge order and diagnosis entered by a provider.   Labor discharge instructions reviewed with patient.

## 2016-07-11 NOTE — Discharge Instructions (Signed)

## 2016-07-11 NOTE — MAU Note (Signed)
Contractions apart

## 2016-07-12 ENCOUNTER — Ambulatory Visit (INDEPENDENT_AMBULATORY_CARE_PROVIDER_SITE_OTHER): Payer: BLUE CROSS/BLUE SHIELD | Admitting: Women's Health

## 2016-07-12 ENCOUNTER — Other Ambulatory Visit: Payer: BLUE CROSS/BLUE SHIELD | Admitting: Obstetrics and Gynecology

## 2016-07-12 ENCOUNTER — Encounter: Payer: Self-pay | Admitting: Women's Health

## 2016-07-12 VITALS — BP 122/68 | HR 96 | Wt 253.4 lb

## 2016-07-12 DIAGNOSIS — Z331 Pregnant state, incidental: Secondary | ICD-10-CM

## 2016-07-12 DIAGNOSIS — O0993 Supervision of high risk pregnancy, unspecified, third trimester: Secondary | ICD-10-CM

## 2016-07-12 DIAGNOSIS — O24419 Gestational diabetes mellitus in pregnancy, unspecified control: Secondary | ICD-10-CM | POA: Diagnosis not present

## 2016-07-12 DIAGNOSIS — Z1389 Encounter for screening for other disorder: Secondary | ICD-10-CM

## 2016-07-12 LAB — POCT URINALYSIS DIPSTICK
Glucose, UA: NEGATIVE
Ketones, UA: NEGATIVE
NITRITE UA: NEGATIVE
PROTEIN UA: NEGATIVE

## 2016-07-12 NOTE — Progress Notes (Signed)
High Risk Pregnancy Diagnosis(es): A2DM [id number] [redacted]w[redacted]d Estimated Date of Delivery: 07/30/16 BP 122/68   Pulse 96   Wt 253 lb 6.4 oz (114.9 kg)   LMP 10/18/2015 (Approximate)   BMI 39.69 kg/m   Urinalysis: Negative HPI:  Doing well. Went to [employer] last night for r/o labor. Was 1cm. Sugars have been low in am (70s) but feels symptomatic, so eats something sweet and throws it over. Had been on 2.5mg  BID- was switched to just  pm on Tues, states she feels this is too much at night. Wants to try 1.25mg  AM/2.5mg  PM.  BP, weight, and urine reviewed.  Reports good fm. Denies regular uc's, lof, vb, uti s/s. No complaints.  Fundal Height:  37 Fetal Heart rate:  135, reactive NST Edema:  trace  Reviewed labor s/s, fkc All questions were answered Assessment: [redacted]w[redacted]d A2DM Medication(s) Plans:  To try glyburide 1.25mg AM/2.5mg PM Treatment Plan:  Had growth u/s on Tues, 2x/wk testing, IOL @ 39wks- scheduled for 5/1 Follow up on Tues for high-risk OB appt and NST

## 2016-07-12 NOTE — Patient Instructions (Addendum)
Glyburide 1.25mg  AM/2.5mg  PM Call the office [phone]) or go to [facility] if:  You begin to have strong, frequent contractions  Your water breaks.  Sometimes it is a big gush of fluid, sometimes it is just a trickle that keeps getting your panties wet or running down your legs  You have vaginal bleeding.  It is normal to have a small amount of spotting if your cervix was checked.   You don't feel your baby moving like normal.  If you don't, get you something to eat and drink and lay down and focus on feeling your baby move.  You should feel at least 10 movements in 2 hours.  If you don't, you should call the office or go to [facility].     For Dizzy Spells:   This is usually related to either your blood sugar or your blood pressure dropping  Make sure you are staying well hydrated and drinking enough water so that your urine is clear  Eat small frequent meals and snacks containing protein (meat, eggs, nuts, cheese) so that your blood sugar doesn't drop  If you do get dizzy, sit/lay down and get you something to drink and a snack containing protein- you will usually start feeling better in 10-20 minutes      Braxton Hicks Contractions Contractions of the uterus can occur throughout pregnancy, but they are not always a sign that you are in labor. You may have practice contractions called Braxton Hicks contractions. These false labor contractions are sometimes confused with true labor. What are Deberah Pelton contractions? Braxton Hicks contractions are tightening movements that occur in the muscles of the uterus before labor. Unlike true labor contractions, these contractions do not result in opening (dilation) and thinning of the cervix. Toward the end of pregnancy (32-34 weeks), Braxton Hicks contractions can happen more often and may become stronger. These contractions are sometimes difficult to tell apart from true labor because they can be very uncomfortable. You should  not feel embarrassed if you go to the hospital with false labor. Sometimes, the only way to tell if you are in true labor is for your health care provider to look for changes in the cervix. The health care provider will do a physical exam and may monitor your contractions. If you are not in true labor, the exam should show that your cervix is not dilating and your water has not broken. If there are no prenatal problems or other health problems associated with your pregnancy, it is completely safe for you to be sent home with false labor. You may continue to have Braxton Hicks contractions until you go into true labor. How can I tell the difference between true labor and false labor?  Differences  False labor  Contractions last 30-70 seconds.: Contractions are usually shorter and not as strong as true labor contractions.  Contractions become very regular.: Contractions are usually irregular.  Discomfort is usually felt in the top of the uterus, and it spreads to the lower abdomen and low back.: Contractions are often felt in the front of the lower abdomen and in the groin.  Contractions do not go away with walking.: Contractions may go away when you walk around or change positions while lying down.  Contractions usually become more intense and increase in frequency.: Contractions get weaker and are shorter-lasting as time goes on.  The cervix dilates and gets thinner.: The cervix usually does not dilate or become thin. Follow these instructions at home:  Take  over-the-counter and prescription medicines only as told by your health care provider.  Keep up with your usual exercises and follow other instructions from your health care provider.  Eat and drink lightly if you think you are going into labor.  If Braxton Hicks contractions are making you uncomfortable:  Change your position from lying down or resting to walking, or change from walking to resting.  Sit and rest in a tub of warm  water.  Drink enough fluid to keep your urine clear or pale yellow. Dehydration may cause these contractions.  Do slow and deep breathing several times an hour.  Keep all follow-up prenatal visits as told by your health care provider. This is important. Contact a health care provider if:  You have a fever.  You have continuous pain in your abdomen. Get help right away if:  Your contractions become stronger, more regular, and closer together.  You have fluid leaking or gushing from your vagina.  You pass blood-tinged mucus (bloody show).  You have bleeding from your vagina.  You have low back pain that you never had before.  You feel your baby's head pushing down and causing pelvic pressure.  Your baby is not moving inside you as much as it used to. Summary  Contractions that occur before labor are called Braxton Hicks contractions, false labor, or practice contractions.  Braxton Hicks contractions are usually shorter, weaker, farther apart, and less regular than true labor contractions. True labor contractions usually become progressively stronger and regular and they become more frequent.  Manage discomfort from [facility] contractions by changing position, resting in a warm bath, drinking plenty of water, or practicing deep breathing. This information is not intended to replace advice given to you by your health care provider. Make sure you discuss any questions you have with your health care provider. Document Released: 03/11/2005 Document Revised: 01/29/2016 Document Reviewed: 01/29/2016 Elsevier Interactive Patient Education  2017 [employer].

## 2016-07-14 ENCOUNTER — Encounter (HOSPITAL_COMMUNITY): Payer: Self-pay

## 2016-07-14 ENCOUNTER — Inpatient Hospital Stay (HOSPITAL_COMMUNITY)
Admission: AD | Admit: 2016-07-14 | Discharge: 2016-07-14 | Disposition: A | Discharge status: Home / Self Care | Payer: BLUE CROSS/BLUE SHIELD | Source: Ambulatory Visit | Attending: Obstetrics & Gynecology | Admitting: Obstetrics & Gynecology

## 2016-07-14 DIAGNOSIS — K900 Celiac disease: Secondary | ICD-10-CM | POA: Insufficient documentation

## 2016-07-14 DIAGNOSIS — F329 Major depressive disorder, single episode, unspecified: Secondary | ICD-10-CM | POA: Insufficient documentation

## 2016-07-14 DIAGNOSIS — Z3A37 37 weeks gestation of pregnancy: Secondary | ICD-10-CM | POA: Insufficient documentation

## 2016-07-14 DIAGNOSIS — Z833 Family history of diabetes mellitus: Secondary | ICD-10-CM | POA: Diagnosis not present

## 2016-07-14 DIAGNOSIS — E669 Obesity, unspecified: Secondary | ICD-10-CM | POA: Insufficient documentation

## 2016-07-14 DIAGNOSIS — O99213 Obesity complicating pregnancy, third trimester: Secondary | ICD-10-CM | POA: Insufficient documentation

## 2016-07-14 DIAGNOSIS — O09523 Supervision of elderly multigravida, third trimester: Secondary | ICD-10-CM | POA: Insufficient documentation

## 2016-07-14 DIAGNOSIS — Z841 Family history of disorders of kidney and ureter: Secondary | ICD-10-CM | POA: Diagnosis not present

## 2016-07-14 DIAGNOSIS — Z803 Family history of malignant neoplasm of breast: Secondary | ICD-10-CM | POA: Diagnosis not present

## 2016-07-14 DIAGNOSIS — O24419 Gestational diabetes mellitus in pregnancy, unspecified control: Secondary | ICD-10-CM | POA: Insufficient documentation

## 2016-07-14 DIAGNOSIS — G5600 Carpal tunnel syndrome, unspecified upper limb: Secondary | ICD-10-CM | POA: Insufficient documentation

## 2016-07-14 DIAGNOSIS — R109 Unspecified abdominal pain: Secondary | ICD-10-CM | POA: Diagnosis present

## 2016-07-14 DIAGNOSIS — Z820 Family history of epilepsy and other diseases of the nervous system: Secondary | ICD-10-CM | POA: Diagnosis not present

## 2016-07-14 DIAGNOSIS — Z8249 Family history of ischemic heart disease and other diseases of the circulatory system: Secondary | ICD-10-CM | POA: Diagnosis not present

## 2016-07-14 DIAGNOSIS — Z832 Family history of diseases of the blood and blood-forming organs and certain disorders involving the immune mechanism: Secondary | ICD-10-CM | POA: Diagnosis not present

## 2016-07-14 DIAGNOSIS — Z7982 Long term (current) use of aspirin: Secondary | ICD-10-CM | POA: Insufficient documentation

## 2016-07-14 DIAGNOSIS — Z87440 Personal history of urinary (tract) infections: Secondary | ICD-10-CM | POA: Diagnosis not present

## 2016-07-14 DIAGNOSIS — O99343 Other mental disorders complicating pregnancy, third trimester: Secondary | ICD-10-CM | POA: Insufficient documentation

## 2016-07-14 DIAGNOSIS — F909 Attention-deficit hyperactivity disorder, unspecified type: Secondary | ICD-10-CM | POA: Insufficient documentation

## 2016-07-14 DIAGNOSIS — Z91018 Allergy to other foods: Secondary | ICD-10-CM | POA: Insufficient documentation

## 2016-07-14 DIAGNOSIS — E785 Hyperlipidemia, unspecified: Secondary | ICD-10-CM | POA: Diagnosis not present

## 2016-07-14 DIAGNOSIS — O0990 Supervision of high risk pregnancy, unspecified, unspecified trimester: Secondary | ICD-10-CM

## 2016-07-14 DIAGNOSIS — F419 Anxiety disorder, unspecified: Secondary | ICD-10-CM | POA: Diagnosis not present

## 2016-07-14 DIAGNOSIS — O479 False labor, unspecified: Secondary | ICD-10-CM

## 2016-07-14 DIAGNOSIS — O99283 Endocrine, nutritional and metabolic diseases complicating pregnancy, third trimester: Secondary | ICD-10-CM | POA: Insufficient documentation

## 2016-07-14 LAB — URINALYSIS, ROUTINE W REFLEX MICROSCOPIC
BILIRUBIN URINE: NEGATIVE
Glucose, UA: NEGATIVE mg/dL
Hgb urine dipstick: NEGATIVE
Ketones, ur: NEGATIVE mg/dL
Leukocytes, UA: NEGATIVE
Nitrite: NEGATIVE
PROTEIN: NEGATIVE mg/dL
Specific Gravity, Urine: 1.01 (ref 1.005–1.030)
pH: 6 (ref 5.0–8.0)

## 2016-07-14 LAB — PROTEIN / CREATININE RATIO, URINE: Creatinine, Urine: 49 mg/dL

## 2016-07-14 NOTE — MAU Note (Signed)
"  just feels really weird". Spaced out in SS class, just feels like can't connect thoughts.  Feels like her BP is elevated, (was slightly up).  Sugar was ok.. Has irreg contractions, increase in pelvic pressure.  Back spasms.Marland Kitchen

## 2016-07-14 NOTE — Progress Notes (Addendum)
G4P2 @ 37.[redacted] wksga. Denies LOF or bleeding. States "doesn't feel right"  +Fm. EFM applied. VSS see flow sheet for details.   Hx of GHTN previous 2 pregn. Celiac disease   GDM with current pregn and on glyburide.   FHR 140's No UC  1937: provider aware fh strip reactive and reassuring. Monitors taken off.   2048: Discharge instructions received. D/C instructions given with pt understanding. Pt left unit via ambulatory with family.

## 2016-07-14 NOTE — MAU Provider Note (Signed)
History   Patient started to feel "foggy-brained" this morning at [employer]. She has had back spasms, contractions and pelvic pressure over the past three days. She feels like the baby has "dropped". Her provider told her to come into the hospital whenever she starts to feel like labor is starting since she had a fast labor in the past.   She has a history of GHTN in the past but has not been diagnosed with GHTN in this pregnancy. She is getting twice weekly NST. She has an induction scheduled on 07/23/2016.  She is here to get checked out bc she has a history of fast labors and she is tired of being pregnant. She is also concerned because she lives an hour away and wants to stay for admission since her husband doesn't drive and her mother lives an hour away.   Her fasting blood sugar was 71 this morning, and the rest of her sugars have been normal during the day. She denies any signs of hyper or hypoglycemia at this time.   CSN: [id number]  Arrival date and time: 07/14/16 1744   None     Chief Complaint  Patient presents with  . BP up  . Contractions  . back spasms  . difficulty connecting thoughts   Abdominal Pain  This is a new problem. The current episode started in the past 7 days. The onset quality is gradual. The problem occurs intermittently. The problem has been unchanged. The pain is located in the generalized abdominal region and suprapubic region. The pain is moderate. The quality of the pain is cramping. The abdominal pain radiates to the LLQ and RLQ. Pertinent negatives include no diarrhea or nausea. Nothing aggravates the pain. The pain is relieved by nothing.    Patient states that her blood sugar was low this morning and she felt foggy-brained and lethargic. Her back pain and pelvic pressure increased throughout the day. She denies bleeding, leaking of fluid, blurry vision, HA, nausea and vomiting or decreased fetal movements. She has no pain with urination. She endorses  positive fetal movements.   OB History    Gravida Para Term Preterm AB Living   SAB TAB Ectopic Multiple Live Births   1       2      Past Medical History:  Diagnosis Date  . ADHD (attention deficit hyperactivity disorder)   . Anxiety   . Carpal tunnel syndrome   . Celiac disease   . Depression   . Diabetes mellitus without complication (HCC)   . Hyperlipidemia   . Obesity   . Pregnancy induced hypertension   . UTI (urinary tract infection)     Past Surgical History:  Procedure Laterality Date  . MOUTH SURGERY    . SKIN BIOPSY  2012    Family History  Problem Relation Age of Onset  . Cancer Mother     breast  . Diabetes Mother   . Factor V Leiden deficiency Mother   . Heart failure Mother   . Heart disease Mother   . Hypertension Father   . Diabetes Father   . Aneurysm Father   . Kidney disease Father     on dialysis  . Diabetes Maternal Grandmother   . Congestive Heart Failure Maternal Grandmother   . Celiac disease Daughter   . Diabetes Paternal Grandfather   . Congestive Heart Failure Paternal Grandfather   . Other Daughter  Celiac gene    Social History  Substance Use Topics  . Smoking status: Never Smoker  . Smokeless tobacco: Never Used  . Alcohol use No    Allergies:  Allergies  Allergen Reactions  . Gluten Meal     Prescriptions Prior to Admission  Medication Sig Dispense Refill Last Dose  . acetaminophen (TYLENOL) 160 MG/5ML elixir Take by mouth as needed for pain.    Taking  . aspirin EC 81 MG tablet Take 1 tablet (81 mg total) by mouth daily. 30 tablet 6 Taking  . glyBURIDE (DIABETA) 2.5 MG tablet Take 1 tablet (2.5 mg total) by mouth 2 (two) times daily with a meal. 60 tablet 3 Taking  . Prenatal Vit-Fe Fumarate-FA (PNV PRENATAL PLUS MULTIVITAMIN) 27-1 MG TABS Take 1 tablet by mouth once. (Patient taking differently: Take 1 tablet by mouth daily. ) 30 tablet 11 Taking    Review of Systems  Respiratory: Negative.    Cardiovascular: Negative.   Gastrointestinal: Positive for abdominal pain. Negative for diarrhea and nausea.  Genitourinary: Negative.   Musculoskeletal: Positive for back pain.  Neurological: Positive for light-headedness.   Physical Exam   Blood pressure 123/77, pulse 98, temperature 98.9 F (37.2 C), temperature source Oral, resp. rate 20, height  (1.702 m), weight 255 lb 4 oz (115.8 kg), last menstrual period 10/18/2015, SpO2 97 %, currently breastfeeding.  Physical Exam  Constitutional: She is oriented to person, place, and time. She appears well-developed and well-nourished.  HENT:  Head: Normocephalic.  Neck: Normal range of motion.  Respiratory: Effort normal.  GI: Soft. Bowel sounds are normal. She exhibits no distension and no mass. There is no tenderness. There is no rebound and no guarding.  Genitourinary:  Genitourinary Comments: NEFG; cervix is 1, soft, ballotable. No CMT.   Musculoskeletal: Normal range of motion.  Neurological: She is alert and oriented to person, place, and time. She has normal reflexes.  Skin: Skin is warm and dry.  Psychiatric: She has a normal mood and affect.    MAU Course  Procedures  MDM -NST: 140 bpm; moderate variability, present acels, occasional loss of contact but patient reports strong fetal movements.  -BP all normal while here in MAU except for two elevated systolics; no HA, blurry vision , epigastric pain Recommended to patient that she keep her appointment on 07/16/2016 at [facility]. Patient does not want to be discharged from MAU because of her transporation issue, but I explained that there is no medical reason to admit her to the hospital right now. I reviewed when to return (bleeding, leaking of fluid, contractions that increase in frequency or strength, decreased fetal movements).   Vitals:   07/14/16 1931 07/14/16 1934 07/14/16 2043 07/14/16 2045  BP: 125/80  (!) 144/84 125/83  Pulse: 98 (!) 102 97 99  Resp:   18    Temp:   98.2 F (36.8 C)   TempSrc:   Oral   SpO2:  99%    Weight:      Height:          Assessment and Plan   1. Braxton Hick's contraction   2. Gestational diabetes mellitus, class A2   3. Celiac disease   4. Supervision of high risk pregnancy, antepartum    2. Patient stable for discharge; reviewed when to return to the MAU (bleeding, leaking of fluid, decreased fetal movements, hyper or hypoglycemia) 3. Patient to keep her follow up appointment on 07-16-2016 4. Message sent to Joellyn Haff at  [facility] to let her know of patient's MAU visit in case for follow-up tomorrow if necessary.  Charlesetta Garibaldi Tammee Thielke CNM 07/14/2016, 6:57 PM

## 2016-07-15 ENCOUNTER — Telehealth (HOSPITAL_COMMUNITY): Payer: Self-pay | Admitting: *Deleted

## 2016-07-15 ENCOUNTER — Encounter (HOSPITAL_COMMUNITY): Payer: Self-pay | Admitting: *Deleted

## 2016-07-15 NOTE — Telephone Encounter (Signed)
Preadmission screen  

## 2016-07-16 ENCOUNTER — Ambulatory Visit (INDEPENDENT_AMBULATORY_CARE_PROVIDER_SITE_OTHER): Payer: BLUE CROSS/BLUE SHIELD | Admitting: Obstetrics & Gynecology

## 2016-07-16 ENCOUNTER — Encounter: Payer: Self-pay | Admitting: Obstetrics & Gynecology

## 2016-07-16 VITALS — BP 126/70 | HR 74 | Wt 256.6 lb

## 2016-07-16 DIAGNOSIS — O0993 Supervision of high risk pregnancy, unspecified, third trimester: Secondary | ICD-10-CM

## 2016-07-16 DIAGNOSIS — O24419 Gestational diabetes mellitus in pregnancy, unspecified control: Secondary | ICD-10-CM | POA: Diagnosis not present

## 2016-07-16 DIAGNOSIS — Z1389 Encounter for screening for other disorder: Secondary | ICD-10-CM

## 2016-07-16 DIAGNOSIS — Z331 Pregnant state, incidental: Secondary | ICD-10-CM

## 2016-07-16 LAB — POCT URINALYSIS DIPSTICK
Glucose, UA: NEGATIVE
KETONES UA: NEGATIVE
Leukocytes, UA: NEGATIVE
Nitrite, UA: NEGATIVE
PROTEIN UA: NEGATIVE
RBC UA: NEGATIVE

## 2016-07-19 ENCOUNTER — Encounter: Payer: Self-pay | Admitting: Obstetrics & Gynecology

## 2016-07-19 ENCOUNTER — Other Ambulatory Visit: Payer: Self-pay | Admitting: Advanced Practice Midwife

## 2016-07-19 ENCOUNTER — Ambulatory Visit (INDEPENDENT_AMBULATORY_CARE_PROVIDER_SITE_OTHER): Payer: BLUE CROSS/BLUE SHIELD | Admitting: Obstetrics & Gynecology

## 2016-07-19 VITALS — BP 110/80 | HR 72 | Wt 254.4 lb

## 2016-07-19 DIAGNOSIS — O0993 Supervision of high risk pregnancy, unspecified, third trimester: Secondary | ICD-10-CM | POA: Diagnosis not present

## 2016-07-19 DIAGNOSIS — Z331 Pregnant state, incidental: Secondary | ICD-10-CM

## 2016-07-19 DIAGNOSIS — O24419 Gestational diabetes mellitus in pregnancy, unspecified control: Secondary | ICD-10-CM | POA: Diagnosis not present

## 2016-07-19 DIAGNOSIS — Z1389 Encounter for screening for other disorder: Secondary | ICD-10-CM

## 2016-07-19 DIAGNOSIS — Z3A38 38 weeks gestation of pregnancy: Secondary | ICD-10-CM | POA: Diagnosis not present

## 2016-07-19 LAB — POCT URINALYSIS DIPSTICK
Glucose, UA: NEGATIVE
KETONES UA: NEGATIVE
Leukocytes, UA: NEGATIVE
Nitrite, UA: NEGATIVE
Protein, UA: NEGATIVE
RBC UA: NEGATIVE

## 2016-07-19 NOTE — Progress Notes (Signed)
Fetal Surveillance Testing today:  Reactive NST   High Risk Pregnancy Diagnosis(es):   Class A2 DM, Hx of GHTN x 2  [id number] [redacted]w[redacted]d Estimated Date of Delivery: 07/30/16  Blood pressure 110/80, pulse 72, weight 254 lb 6.4 oz (115.4 kg), last menstrual period 10/18/2015, currently breastfeeding.  Urinalysis: Negative   HPI: The patient is being seen today for ongoing management of as above. Today she reports CBG are overall good stable   BP weight and urine results all reviewed and noted. Patient reports good fetal movement, denies any bleeding and no rupture of membranes symptoms or regular contractions.  Fundal Height:  42 Fetal Heart rate:  140 Edema:  1+  Patient is without complaints other than noted in her HPI. All questions were answered.  All lab and sonogram results have been reviewed. Comments:    Assessment:  1.  Pregnancy at [redacted]w[redacted]d,  Estimated Date of Delivery: 07/30/16 :                          2.  Class A2 DM                        3.  Hx of GHTN x 2   Medication(s) Plans:  gluburide 2.5 BID, baby ASA  Treatment Plan:  Induction in 4 days  Return in about 6 weeks (around 08/30/2016) for post partum visit. for appointment for high risk OB care  No orders of the defined types were placed in this encounter.  Orders Placed This Encounter  Procedures  . POCT urinalysis dipstick

## 2016-07-23 ENCOUNTER — Encounter: Payer: Self-pay | Admitting: Women's Health

## 2016-07-23 ENCOUNTER — Encounter (HOSPITAL_COMMUNITY): Payer: Self-pay

## 2016-07-23 ENCOUNTER — Inpatient Hospital Stay (HOSPITAL_COMMUNITY)
Admission: RE | Admit: 2016-07-23 | Discharge: 2016-07-25 | DRG: 775 | Disposition: A | Discharge status: Home / Self Care | Payer: BLUE CROSS/BLUE SHIELD | Source: Ambulatory Visit | Attending: Obstetrics and Gynecology | Admitting: Obstetrics and Gynecology

## 2016-07-23 DIAGNOSIS — Z818 Family history of other mental and behavioral disorders: Secondary | ICD-10-CM | POA: Diagnosis not present

## 2016-07-23 DIAGNOSIS — Z8249 Family history of ischemic heart disease and other diseases of the circulatory system: Secondary | ICD-10-CM | POA: Diagnosis not present

## 2016-07-23 DIAGNOSIS — O99214 Obesity complicating childbirth: Secondary | ICD-10-CM | POA: Diagnosis present

## 2016-07-23 DIAGNOSIS — O24425 Gestational diabetes mellitus in childbirth, controlled by oral hypoglycemic drugs: Principal | ICD-10-CM | POA: Diagnosis present

## 2016-07-23 DIAGNOSIS — O9962 Diseases of the digestive system complicating childbirth: Secondary | ICD-10-CM | POA: Diagnosis not present

## 2016-07-23 DIAGNOSIS — Z8349 Family history of other endocrine, nutritional and metabolic diseases: Secondary | ICD-10-CM | POA: Diagnosis not present

## 2016-07-23 DIAGNOSIS — K900 Celiac disease: Secondary | ICD-10-CM | POA: Diagnosis present

## 2016-07-23 DIAGNOSIS — Z3A39 39 weeks gestation of pregnancy: Secondary | ICD-10-CM

## 2016-07-23 DIAGNOSIS — O0990 Supervision of high risk pregnancy, unspecified, unspecified trimester: Secondary | ICD-10-CM

## 2016-07-23 DIAGNOSIS — O134 Gestational [pregnancy-induced] hypertension without significant proteinuria, complicating childbirth: Secondary | ICD-10-CM | POA: Diagnosis present

## 2016-07-23 DIAGNOSIS — O24429 Gestational diabetes mellitus in childbirth, unspecified control: Secondary | ICD-10-CM | POA: Diagnosis not present

## 2016-07-23 DIAGNOSIS — Z6839 Body mass index (BMI) 39.0-39.9, adult: Secondary | ICD-10-CM | POA: Diagnosis not present

## 2016-07-23 DIAGNOSIS — O24419 Gestational diabetes mellitus in pregnancy, unspecified control: Secondary | ICD-10-CM | POA: Diagnosis present

## 2016-07-23 DIAGNOSIS — Z833 Family history of diabetes mellitus: Secondary | ICD-10-CM | POA: Diagnosis not present

## 2016-07-23 DIAGNOSIS — Z7982 Long term (current) use of aspirin: Secondary | ICD-10-CM | POA: Diagnosis not present

## 2016-07-23 LAB — COMPREHENSIVE METABOLIC PANEL
ALT: 19 U/L (ref 14–54)
AST: 25 U/L (ref 15–41)
Albumin: 2.7 g/dL — ABNORMAL LOW (ref 3.5–5.0)
Alkaline Phosphatase: 81 U/L (ref 38–126)
Anion gap: 10 (ref 5–15)
BUN: 9 mg/dL (ref 6–20)
CALCIUM: 8.3 mg/dL — AB (ref 8.9–10.3)
CHLORIDE: 103 mmol/L (ref 101–111)
CO2: 19 mmol/L — ABNORMAL LOW (ref 22–32)
CREATININE: 0.78 mg/dL (ref 0.44–1.00)
Glucose, Bld: 141 mg/dL — ABNORMAL HIGH (ref 65–99)
Potassium: 3.9 mmol/L (ref 3.5–5.1)
Sodium: 132 mmol/L — ABNORMAL LOW (ref 135–145)
TOTAL PROTEIN: 6.1 g/dL — AB (ref 6.5–8.1)
Total Bilirubin: 0.9 mg/dL (ref 0.3–1.2)

## 2016-07-23 LAB — RPR: RPR: NONREACTIVE

## 2016-07-23 LAB — CBC
HCT: 35.2 % — ABNORMAL LOW (ref 36.0–46.0)
HCT: 37.9 % (ref 36.0–46.0)
Hemoglobin: 11.5 g/dL — ABNORMAL LOW (ref 12.0–15.0)
Hemoglobin: 12.4 g/dL (ref 12.0–15.0)
MCH: 28.8 pg (ref 26.0–34.0)
MCH: 28.9 pg (ref 26.0–34.0)
MCHC: 32.7 g/dL (ref 30.0–36.0)
MCHC: 32.7 g/dL (ref 30.0–36.0)
MCV: 88 fL (ref 78.0–100.0)
MCV: 88.3 fL (ref 78.0–100.0)
PLATELETS: 167 10*3/uL (ref 150–400)
Platelets: 161 10*3/uL (ref 150–400)
RBC: 4 MIL/uL (ref 3.87–5.11)
RBC: 4.29 MIL/uL (ref 3.87–5.11)
RDW: 15.3 % (ref 11.5–15.5)
RDW: 15.3 % (ref 11.5–15.5)
WBC: 8 10*3/uL (ref 4.0–10.5)
WBC: 18 10*3/uL — ABNORMAL HIGH (ref 4.0–10.5)

## 2016-07-23 LAB — GLUCOSE, CAPILLARY
GLUCOSE-CAPILLARY: 62 mg/dL — AB (ref 65–99)
Glucose-Capillary: 106 mg/dL — ABNORMAL HIGH (ref 65–99)
Glucose-Capillary: 109 mg/dL — ABNORMAL HIGH (ref 65–99)
Glucose-Capillary: 79 mg/dL (ref 65–99)
Glucose-Capillary: 102 mg/dL — ABNORMAL HIGH (ref 65–99)

## 2016-07-23 LAB — TYPE AND SCREEN
ABO/RH(D): B POS
Antibody Screen: NEGATIVE

## 2016-07-23 LAB — ABO/RH: ABO/RH(D): B POS

## 2016-07-23 MED ORDER — LACTATED RINGERS IV SOLN
INTRAVENOUS | Status: DC
  Administered 2016-07-23: 20:00:00 via INTRAUTERINE

## 2016-07-23 MED ORDER — OXYTOCIN 40 UNITS IN LACTATED RINGERS INFUSION - SIMPLE MED
1.0000 m[IU]/min | INTRAVENOUS | Status: DC
  Administered 2016-07-23: 2 m[IU]/min via INTRAVENOUS
  Filled 2016-07-23: qty 1000

## 2016-07-23 MED ORDER — SENNOSIDES-DOCUSATE SODIUM 8.6-50 MG PO TABS
2.0000 | ORAL_TABLET | ORAL | Status: DC
  Administered 2016-07-24 (×2): 2 via ORAL
  Filled 2016-07-23 (×2): qty 2

## 2016-07-23 MED ORDER — LACTATED RINGERS IV SOLN
500.0000 mL | INTRAVENOUS | Status: DC | PRN
  Administered 2016-07-23: 500 mL via INTRAVENOUS

## 2016-07-23 MED ORDER — COCONUT OIL OIL: 1.0000 "application " | TOPICAL_OIL | Status: DC | PRN

## 2016-07-23 MED ORDER — TERBUTALINE SULFATE 1 MG/ML IJ SOLN
0.2500 mg | Freq: Once | INTRAMUSCULAR | Status: DC | PRN
  Filled 2016-07-23: qty 1

## 2016-07-23 MED ORDER — OXYTOCIN BOLUS FROM INFUSION
500.0000 mL | Freq: Once | INTRAVENOUS | Status: AC
  Administered 2016-07-23: 500 mL via INTRAVENOUS

## 2016-07-23 MED ORDER — FENTANYL CITRATE (PF) 100 MCG/2ML IJ SOLN
100.0000 ug | INTRAMUSCULAR | Status: DC | PRN
  Administered 2016-07-23: 100 ug via INTRAVENOUS
  Filled 2016-07-23: qty 2

## 2016-07-23 MED ORDER — ACETAMINOPHEN 325 MG PO TABS: 650.0000 mg | ORAL_TABLET | ORAL | Status: DC | PRN

## 2016-07-23 MED ORDER — ACETAMINOPHEN 325 MG PO TABS
650.0000 mg | ORAL_TABLET | ORAL | Status: DC | PRN
  Administered 2016-07-24 – 2016-07-25 (×3): 650 mg via ORAL
  Filled 2016-07-23 (×3): qty 2

## 2016-07-23 MED ORDER — LACTATED RINGERS IV SOLN
INTRAVENOUS | Status: DC
Start: 2016-07-23 — End: 2016-07-23
  Administered 2016-07-23: 125 mL via INTRAVENOUS
  Administered 2016-07-23: 15:00:00 via INTRAVENOUS

## 2016-07-23 MED ORDER — MISOPROSTOL 25 MCG QUARTER TABLET
25.0000 ug | ORAL_TABLET | ORAL | Status: DC | PRN
  Administered 2016-07-23: 25 ug via VAGINAL
  Filled 2016-07-23 (×2): qty 1

## 2016-07-23 MED ORDER — LIDOCAINE HCL (PF) 1 % IJ SOLN
30.0000 mL | INTRAMUSCULAR | Status: DC | PRN
  Administered 2016-07-23: 30 mL via SUBCUTANEOUS
  Filled 2016-07-23: qty 30

## 2016-07-23 MED ORDER — TETANUS-DIPHTH-ACELL PERTUSSIS 5-2.5-18.5 LF-MCG/0.5 IM SUSP: 0.5000 mL | Freq: Once | INTRAMUSCULAR | Status: DC

## 2016-07-23 MED ORDER — WITCH HAZEL-GLYCERIN EX PADS: 1.0000 "application " | MEDICATED_PAD | CUTANEOUS | Status: DC | PRN

## 2016-07-23 MED ORDER — OXYCODONE-ACETAMINOPHEN 5-325 MG PO TABS: 2.0000 | ORAL_TABLET | ORAL | Status: DC | PRN

## 2016-07-23 MED ORDER — OXYCODONE-ACETAMINOPHEN 5-325 MG PO TABS: 1.0000 | ORAL_TABLET | ORAL | Status: DC | PRN

## 2016-07-23 MED ORDER — DIBUCAINE 1 % RE OINT: 1.0000 "application " | TOPICAL_OINTMENT | RECTAL | Status: DC | PRN

## 2016-07-23 MED ORDER — DIPHENHYDRAMINE HCL 25 MG PO CAPS: 25.0000 mg | ORAL_CAPSULE | Freq: Four times a day (QID) | ORAL | Status: DC | PRN

## 2016-07-23 MED ORDER — ZOLPIDEM TARTRATE 5 MG PO TABS: 5.0000 mg | ORAL_TABLET | Freq: Every evening | ORAL | Status: DC | PRN

## 2016-07-23 MED ORDER — PRENATAL MULTIVITAMIN CH
1.0000 | ORAL_TABLET | Freq: Every day | ORAL | Status: DC
  Administered 2016-07-24 – 2016-07-25 (×2): 1 via ORAL
  Filled 2016-07-23 (×2): qty 1

## 2016-07-23 MED ORDER — IBUPROFEN 600 MG PO TABS
600.0000 mg | ORAL_TABLET | Freq: Four times a day (QID) | ORAL | Status: DC
  Administered 2016-07-23 – 2016-07-25 (×7): 600 mg via ORAL
  Filled 2016-07-23 (×7): qty 1

## 2016-07-23 MED ORDER — BENZOCAINE-MENTHOL 20-0.5 % EX AERO: 1.0000 "application " | INHALATION_SPRAY | CUTANEOUS | Status: DC | PRN

## 2016-07-23 MED ORDER — ONDANSETRON HCL 4 MG/2ML IJ SOLN
4.0000 mg | Freq: Four times a day (QID) | INTRAMUSCULAR | Status: DC | PRN
  Administered 2016-07-23 (×2): 4 mg via INTRAVENOUS
  Filled 2016-07-23 (×2): qty 2

## 2016-07-23 MED ORDER — SIMETHICONE 80 MG PO CHEW: 80.0000 mg | CHEWABLE_TABLET | ORAL | Status: DC | PRN

## 2016-07-23 MED ORDER — ONDANSETRON HCL 4 MG PO TABS: 4.0000 mg | ORAL_TABLET | ORAL | Status: DC | PRN

## 2016-07-23 MED ORDER — SOD CITRATE-CITRIC ACID 500-334 MG/5ML PO SOLN: 30.0000 mL | ORAL | Status: DC | PRN

## 2016-07-23 MED ORDER — ONDANSETRON HCL 4 MG/2ML IJ SOLN
4.0000 mg | INTRAMUSCULAR | Status: DC | PRN
  Administered 2016-07-24: 4 mg via INTRAVENOUS
  Filled 2016-07-23: qty 2

## 2016-07-23 MED ORDER — OXYTOCIN 40 UNITS IN LACTATED RINGERS INFUSION - SIMPLE MED: 2.5000 [IU]/h | INTRAVENOUS | Status: DC

## 2016-07-23 NOTE — Anesthesia Pain Management Evaluation Note (Signed)
  CRNA Pain Management Visit Note  Patient: Julia Johnson, 36 y.o., female  "Hello I am a member of the anesthesia team at [facility]. We have an anesthesia team available at all times to provide care throughout the hospital, including epidural management and anesthesia for C-section. I don't know your plan for the delivery whether it a natural birth, water birth, IV sedation, nitrous supplementation, doula or epidural, but we want to meet your pain goals."   1.Was your pain managed to your expectations on prior hospitalizations?   Yes   2.What is your expectation for pain management during this hospitalization?     IV pain meds  3.How can we help you reach that goal?   Record the patient's initial score and the patient's pain goal.   Pain: 3  Pain Goal: 8 The [facility] wants you to be able to say your pain was always managed very well.  Laban Emperor 07/23/2016

## 2016-07-23 NOTE — H&P (Signed)
LABOR AND DELIVERY ADMISSION HISTORY AND PHYSICAL NOTE  Julia Johnson is a 36 y.o. female [phone] with IUP at [redacted]w[redacted]d by 6 week [place]  presenting for IOL 2/2 GDMA2 on glyburide. Patient has a history of gHTN with previous pregnancy. Patient had twice weekly surveillance and EFW at 36 weeks was estimated at 72%.   She reports positive fetal movement. She denies leakage of fluid or vaginal bleeding.  Prenatal History/Complications:  Past Medical History: Past Medical History:  Diagnosis Date  . ADHD (attention deficit hyperactivity disorder)   . Anxiety   . Carpal tunnel syndrome   . Celiac disease   . Depression   . Diabetes mellitus without complication (HCC)   . Gestational diabetes    glyburide  . Hyperlipidemia   . Obesity   . Pregnancy induced hypertension   . UTI (urinary tract infection)     Past Surgical History: Past Surgical History:  Procedure Laterality Date  . MOUTH SURGERY    . SKIN BIOPSY  2012    Obstetrical History: OB History    Gravida Para Term Preterm AB Living   SAB TAB Ectopic Multiple Live Births   1       2      Social History: Social History   Social History  . Marital status: Married    Spouse name: N/A  . Number of children: N/A  . Years of education: N/A   Social History Main Topics  . Smoking status: Never Smoker  . Smokeless tobacco: Never Used  . Alcohol use No  . Drug use: No  . Sexual activity: Yes    Birth control/ protection: None   Other Topics Concern  . None   Social History Narrative  . None    Family History: Family History  Problem Relation Age of Onset  . Cancer Mother     breast  . Diabetes Mother   . Factor V Leiden deficiency Mother   . Heart failure Mother   . Heart disease Mother   . Hypertension Father   . Diabetes Father   . Aneurysm Father   . Kidney disease Father     on dialysis  . Diabetes Maternal Grandmother   . Congestive Heart Failure Maternal Grandmother   . Celiac  disease Daughter   . Diabetes Paternal Grandfather   . Congestive Heart Failure Paternal Grandfather   . Other Daughter     Celiac gene    Allergies: Allergies  Allergen Reactions  . Gluten Meal     Celiac disease    Prescriptions Prior to Admission  Medication Sig Dispense Refill Last Dose  . aspirin 81 MG chewable tablet Chew 81 mg by mouth daily.   07/22/2016 at Unknown time  . glyBURIDE (DIABETA) 2.5 MG tablet Take 1 tablet (2.5 mg total) by mouth 2 (two) times daily with a meal. (Patient taking differently: Take 2.5 mg by mouth at bedtime. ) 60 tablet 3 07/22/2016 at Unknown time  . Prenatal Vit-Fe Fumarate-FA (PNV PRENATAL PLUS MULTIVITAMIN) 27-1 MG TABS Take 1 tablet by mouth once. (Patient taking differently: Take 1 tablet by mouth daily. ) 30 tablet 11 07/22/2016 at Unknown time     Review of Systems   All systems reviewed and negative except as stated in HPI  Blood pressure 127/84, pulse (!) 105, temperature 98.5 F (36.9 C), temperature source Oral, resp. rate 20, height  (1.702 m), weight 254 lb (115.2 kg), last  menstrual period 10/18/2015, currently breastfeeding. General appearance: alert, cooperative and appears stated age Lungs: normal respiratory effort, no audible wheezing  Heart: regular rate and pulses palpated bilaterally Abdomen: soft, non-tender; gravid abdomen appropriate for gestational age  Extremities: No calf swelling or tenderness Presentation: cephalic by nurse exam Fetal monitoring: FHR 140, moderate variability, no decel Uterine activity: undetectabe Dilation: 2 Effacement (%): 60 Station: -2 Exam by:: h stone rnc   Prenatal labs: ABO, Rh: --/--/B POS, B POS (05/01 7829) Antibody: NEG (05/01 0749) Rubella: !Error! IMMUNE RPR: Non Reactive (02/09 0918)  HBsAg: Negative (10/02 1056)  HIV: Non Reactive (02/09 0918)  GBS: Negative (04/17 1500)  1 hr Glucola: 102 Genetic screening: Negative Anatomy US: Normal  Prenatal Transfer Tool   Maternal Diabetes: Yes:  Diabetes Type:  Insulin/Medication controlled Genetic Screening: Normal Maternal Ultrasounds/Referrals: Normal Fetal Ultrasounds or other Referrals:  None Maternal Substance Abuse:  No Significant Maternal Medications:  Meds include: Other: Glyburide Significant Maternal Lab Results: Lab values include: Group B Strep negative  Results for orders placed or performed during the hospital encounter of 07/23/16 (from the past 24 hour(s))  CBC   Collection Time: 07/23/16  7:49 AM  Result Value Ref Range   WBC 8.0 4.0 - 10.5 K/uL   RBC 4.00 3.87 - 5.11 MIL/uL   Hemoglobin 11.5 (L) 12.0 - 15.0 g/dL   HCT 56.2 (L) 13.0 - 86.5 %   MCV 88.0 78.0 - 100.0 fL   MCH 28.8 26.0 - 34.0 pg   MCHC 32.7 30.0 - 36.0 g/dL   RDW 78.4 69.6 - 29.5 %   Platelets 161 150 - 400 K/uL  Type and screen   Collection Time: 07/23/16  7:49 AM  Result Value Ref Range   ABO/RH(D) B POS    Antibody Screen NEG    Sample Expiration 07/26/2016   ABO/Rh   Collection Time: 07/23/16  7:49 AM  Result Value Ref Range   ABO/RH(D) B POS   Glucose, capillary   Collection Time: 07/23/16  8:10 AM  Result Value Ref Range   Glucose-Capillary 106 (H) 65 - 99 mg/dL    Patient Active Problem List   Diagnosis Date Noted  . Gestational diabetes 07/23/2016  . Gestational diabetes mellitus, class A2 05/06/2016  . Celiac disease 01/17/2016  . Hypercholesterolemia 01/05/2016  . Supervision of high risk pregnancy, antepartum 12/20/2015  . AMA (advanced maternal age) multigravida 35+ 12/20/2015  . Menorrhagia with regular cycle 05/29/2015  . Family history of factor V Leiden mutation 05/29/2015  . PMDD (premenstrual dysphoric disorder) 05/29/2015  . History of gestational hypertension 10/11/2013  . Family history of breast cancer in first degree relative 04/27/2013  . Depression with anxiety 02/23/2013    Assessment: Julia Johnson is a 36 y.o. [id number] at [redacted]w[redacted]d here for IOL in the setting of GDMA2 on  glyburide. Patient has a history of gHTN but BP have been within normal limits.  #Labor: Induction and augmentation with initial placement of foley cath and cytotec #Pain:  IV pain meds with epidural at patient request #FWB: Cat 1 #ID:  GBS negative  #MOF: Breast #MOC: Condoms or rhythm method #Circ:   N/A  Abdoulaye Diallo, PGY-1 07/23/2016, 10:04 AM   OB FELLOW HISTORY AND PHYSICAL ATTESTATION  I have seen and examined this patient; I agree with above documentation in the resident's note.    Jen Mow, DO [place] Fellow 07/23/2016

## 2016-07-23 NOTE — Progress Notes (Signed)
Patient ID: Julia Johnson, female   DOB: 09-Mar-1981, 36 y.o.   MRN: [id number]  S: Patient seen & examined for progress of labor. Patient uncomfortable with contractions but is refusing epidural, attempted Nitrous Oxide, did not like effects, will try Fentanyl.    O:  Vitals:   07/23/16 1700 07/23/16 1730 07/23/16 1800 07/23/16 1833  BP: 132/78 (!) 135/92 139/84 (!) 129/55  Pulse: 78 88 86 82  Resp: Temp:   98.7 F (37.1 C)   TempSrc:   Oral   Weight:      Height:        Dilation: 6 Effacement (%): 70 Cervical Position: Posterior Station: -2 Presentation: Vertex Exam by:: stone rnc   FHT: 140bpm, mod var, +accels, no decels TOCO: q2-61min  AROM performed with clear fluid, light amount, and some blood-tinge discharge.   A/P: AROM performed Continue pitocin Continue expectant management Anticipate SVD

## 2016-07-23 NOTE — Progress Notes (Signed)
Patient ID: Julia Johnson, female   DOB: Nov 30, 1980, 36 y.o.   MRN: [id number]  S: Patient seen & examined for progress of labor. Patient comfortable.    O:  Vitals:   07/23/16 0931 07/23/16 0946 07/23/16 1001 07/23/16 1239  BP: 130/87 129/71 113/76 137/82  Pulse: 89 84 87 83  Resp:   18 20  Temp:    98.1 F (36.7 C)  TempSrc:    Oral  Weight:      Height:        Dilation: 2.5 Effacement (%): 70, 80 Cervical Position: Posterior Station: -2 Presentation: Vertex Exam by:: dr Omer Jack  FB placed with ease, inflated to 60cc with saline.  FHT: 145bpm, mod var, +accels, a single variable decel that returned to baseline when patient was laid flat, no other decels TOCO: Rare   A/P: Foley bulb placed Will switch to pitocin at next cytotec dose Continue expectant management Anticipate SVD

## 2016-07-24 LAB — GLUCOSE, CAPILLARY: GLUCOSE-CAPILLARY: 236 mg/dL — AB (ref 65–99)

## 2016-07-24 MED ORDER — PROMETHAZINE HCL 25 MG PO TABS: 25.0000 mg | ORAL_TABLET | Freq: Four times a day (QID) | ORAL | Status: DC | PRN

## 2016-07-24 MED ORDER — PROMETHAZINE HCL 25 MG RE SUPP: 25.0000 mg | Freq: Four times a day (QID) | RECTAL | Status: DC | PRN

## 2016-07-24 MED ORDER — FENTANYL CITRATE (PF) 100 MCG/2ML IJ SOLN
50.0000 ug | INTRAMUSCULAR | Status: DC | PRN
  Administered 2016-07-24: 50 ug via INTRAVENOUS
  Filled 2016-07-24: qty 2

## 2016-07-24 MED ORDER — PROMETHAZINE HCL 25 MG/ML IJ SOLN: 12.5000 mg | Freq: Four times a day (QID) | INTRAMUSCULAR | Status: DC | PRN

## 2016-07-24 NOTE — Progress Notes (Signed)
Post Partum Day #1 Subjective: no complaints, up ad lib, voiding, tolerating PO and reports strong cramps when she is breastfeeding.  Objective: Blood pressure (!) 101/47, pulse 91, temperature 98.6 F (37 C), temperature source Oral, resp. rate 18, height  (1.702 m), weight 115.2 kg (254 lb), last menstrual period 10/18/2015, SpO2 100 %, currently breastfeeding.  Physical Exam:  General: alert Lochia: appropriate Uterine Fundus: firm and NT U-2 DVT Evaluation: No evidence of DVT seen on physical exam.   Recent Labs  07/23/16 0749 07/23/16 2218  HGB 11.5* 12.4  HCT 35.2* 37.9    Assessment/Plan: Plan for discharge tomorrow   LOS: 1 day   Allie Bossier 07/24/2016, 6:31 AM

## 2016-07-24 NOTE — Progress Notes (Signed)
Noted that patient had gestational diabetes prior to delivery. Recommend checking blood sugars TID & HS while in the hospital.  Also, recommend changing diet to CHO Modified Medium.   Smith Mince RN BSN CDE Diabetes Coordinator Pager: [phone]  8am-5pm

## 2016-07-24 NOTE — Lactation Note (Signed)
This note was copied from a baby's chart. Lactation Consultation Note Experienced BF mom BF her 1st child for 18 months and her 2nd child now 36 yrs old for 27 months, pregnant in her 1st trimester at 11 weeks she stopped with this baby. Will BF as long as she can with this baby. Mom was BF then she has been formula feeding. LC asked if mom was Breast/formula. Mom stated no she was just doing formula/bottles d/t severe vaginal and abdominal pain. Mom stated when the pain gets better then she will start BF again. Mom stated she has no worries of her milk supply and that she know everything about BF. Did have to come to out pt. Services for consult for last child w/BF help on weaning.  Encouraged mom to call for assistance if needed Left LC information and community resources information. Patient Name: Julia Johnson [id number] Date: 07/24/2016 Reason for consult: Initial assessment   Maternal Data Has patient been taught Hand Expression?: Yes Does the patient have breastfeeding experience prior to this delivery?: Yes  Feeding Feeding Type: Formula Nipple Type: Slow - flow  LATCH Score/Interventions                      Lactation Tools Discussed/Used     Consult Status Consult Status: Follow-up Date: 07/25/16 Follow-up type: In-patient    Levander Katzenstein, Diamond Nickel 07/24/2016, 6:06 AM

## 2016-07-24 NOTE — Progress Notes (Signed)
MOB was referred for history of depression/anxiety. * Referral screened out by Clinical Social Worker because none of the following criteria appear to apply: ~ History of anxiety/depression during this pregnancy, or of post-partum depression. ~ Diagnosis of anxiety and/or depression within last 3 years (2014) OR * MOB's symptoms currently being treated with medication and/or therapy. Please contact the Clinical Social Worker if needs arise, or if MOB requests.  Chart notes: "Was on prozac and adderrall prior to pregnancy, quit w/ + PT. 12/2: states she's doing well off meds at this time, referred to [facility] per request to begin therapy." 

## 2016-07-25 MED ORDER — IBUPROFEN 600 MG PO TABS: 600.0000 mg | ORAL_TABLET | Freq: Four times a day (QID) | ORAL | 0 refills | Status: DC

## 2016-07-25 NOTE — Discharge Summary (Signed)
OB Discharge Summary  Patient Name: Julia Johnson DOB: 27-Aug-1980 MRN: [id number]  Date of admission: 07/23/2016 Delivering MD: Jen Mow [facility]   Date of discharge: 07/25/2016  Admitting diagnosis: INDUCTION Intrauterine pregnancy: [redacted]w[redacted]d     Secondary diagnosis:Active Problems:   Gestational diabetes  Additional problems:morbid obesity, AMA, GHTN     Discharge diagnosis: Term Pregnancy Delivered, Gestational Hypertension and GDM A2        Augmentation: AROM, Pitocin, Cytotec and Foley Balloon  Complications: None  Hospital course:  Induction of Labor With Vaginal Delivery   36 y.o. yo [id number] at [redacted]w[redacted]d was admitted to the hospital 07/23/2016 for induction of labor.  Indication for induction: Gestational hypertension and A2 DM.  Patient had an uncomplicated labor course as follows: Membrane Rupture Time/Date: 6:36 PM ,07/23/2016   Intrapartum Procedures: Episiotomy: None [1]                                         Lacerations:  2nd degree [3]  Patient had delivery of a Viable infant.  Information for the patient's newborn:  Hallelujah, Wysong [[id number]]  Delivery Method: Vaginal, Spontaneous Delivery (Filed from Delivery Summary)   07/23/2016  Details of delivery can be found in separate delivery note.  Patient had a routine postpartum course. Patient is discharged home 07/25/16.  Physical exam  Vitals:   07/24/16 0018 07/24/16 0305 07/24/16 1900 07/25/16 0600  BP: 136/67 (!) 101/47 129/72 113/80  Pulse: 93 91 93 86  Resp: 18 18 20 20   Temp: 98.6 F (37 C) 98.6 F (37 C) 97.9 F (36.6 C) 98.1 F (36.7 C)  TempSrc: Axillary Oral Oral Oral  SpO2: 100%  100%   Weight:      Height:       General: alert Lochia: appropriate Uterine Fundus: firm Incision: N/A DVT Evaluation: No evidence of DVT seen on physical exam. Labs: Lab Results  Component Value Date   WBC 18.0 (H) 07/23/2016   HGB 12.4 07/23/2016   HCT 37.9 07/23/2016   MCV 88.3 07/23/2016   PLT  167 07/23/2016   CMP Latest Ref Rng & Units 07/23/2016  Glucose 65 - 99 mg/dL 403(K)  BUN 6 - 20 mg/dL 9  Creatinine 7.42 - 5.95 mg/dL 6.38  Sodium 756 - 433 mmol/L 132(L)  Potassium 3.5 - 5.1 mmol/L 3.9  Chloride 101 - 111 mmol/L 103  CO2 22 - 32 mmol/L 19(L)  Calcium 8.9 - 10.3 mg/dL 8.3(L)  Total Protein 6.5 - 8.1 g/dL 6.1(L)  Total Bilirubin 0.3 - 1.2 mg/dL 0.9  Alkaline Phos 38 - 126 U/L 81  AST 15 - 41 U/L 25  ALT 14 - 54 U/L 19    Discharge instruction: per After Visit Summary and "Baby and Me Booklet".  After Visit Meds:  Allergies as of 07/25/2016      Reactions   Gluten Meal    Celiac disease      Medication List    STOP taking these medications   aspirin 81 MG chewable tablet   glyBURIDE 2.5 MG tablet Commonly known as:  DIABETA     TAKE these medications   ibuprofen 600 MG tablet Commonly known as:  ADVIL,MOTRIN Take 1 tablet (600 mg total) by mouth every 6 (six) hours.   PNV PRENATAL PLUS MULTIVITAMIN 27-1 MG Tabs Take 1 tablet by mouth once. What changed:  when  to take this       Diet: carb modified diet  Activity: Advance as tolerated. Pelvic rest for 6 weeks.   Outpatient follow up:[facility] Follow up Appt:Future Appointments Date Time Provider Department Center  08/30/2016 10:30 AM Cheral MarkerKimberly R Booker, CNM [facility]   Follow up visit: No Follow-up on file.  Postpartum contraception: Nexplanon  Newborn Data: Live born female  Birth Weight: 7 lb 14.4 oz (3583 g) APGAR: 8, 9  Baby Feeding: Bottle Disposition:rooming in   07/25/2016 Allie BossierMyra C Keiondre Colee, MD

## 2016-07-25 NOTE — Discharge Instructions (Signed)

## 2016-07-25 NOTE — Lactation Note (Signed)
This note was copied from a baby's chart. Lactation Consultation Note  Patient Name: Julia Johnson [id number]'s Date: 07/25/2016  Mom is pumping and bottle feeding expressed milk and formula.  Baby is receiving triple phototherapy and mom doesn't want baby away from lights to feed.  Stressed importance of pumping every 3 hours to establish her milk supply.  Encouraged to call with concerns/assist prn.   Maternal Data    Feeding Feeding Type: Breast Milk Nipple Type: Slow - flow  LATCH Score/Interventions                      Lactation Tools Discussed/Used     Consult Status      Huston FoleyMOULDEN, Tyreanna Bisesi S 07/25/2016, 11:21 AM

## 2016-07-26 ENCOUNTER — Ambulatory Visit: Payer: Self-pay

## 2016-07-26 NOTE — Lactation Note (Signed)
This note was copied from a baby's chart. Lactation Consultation Note  Patient Name: Julia Johnson [id number]'s Date: 07/26/2016  Mom and baby ready for discharge.  Mom plans on getting baby back to breast once home.  She will also continue pumping so family members can give a bottle.  Mom will continue to supplement with expressed milk/formula until milk is fully in.  Lactation services and support information reviewed and encouraged.   Maternal Data    Feeding Feeding Type: Bottle Fed - Formula  LATCH Score/Interventions                      Lactation Tools Discussed/Used     Consult Status      Huston FoleyMOULDEN, Tomi Grandpre S 07/26/2016, 9:51 AM

## 2016-07-29 ENCOUNTER — Telehealth: Payer: Self-pay | Admitting: *Deleted

## 2016-07-29 NOTE — Telephone Encounter (Signed)
Patient called with complaints of back spasms. Informed patient that it could be from not sitting in a good position when pumping or breastfeeding or just from being recently pregnant. She is voiding and having regular bowel movements. Advised that I did not think patient needed to be seen at this time but to let us know if it did not get better or got worse. Patient verbalized understanding.

## 2016-08-22 ENCOUNTER — Ambulatory Visit: Payer: Self-pay

## 2016-08-22 DIAGNOSIS — M79661 Pain in right lower leg: Secondary | ICD-10-CM | POA: Diagnosis not present

## 2016-08-22 DIAGNOSIS — K900 Celiac disease: Secondary | ICD-10-CM | POA: Diagnosis not present

## 2016-08-22 DIAGNOSIS — O24419 Gestational diabetes mellitus in pregnancy, unspecified control: Secondary | ICD-10-CM | POA: Diagnosis not present

## 2016-08-22 DIAGNOSIS — Z803 Family history of malignant neoplasm of breast: Secondary | ICD-10-CM | POA: Diagnosis not present

## 2016-08-22 NOTE — Progress Notes (Signed)
High Risk Pregnancy Diagnosis(es): A2DM [id number] 5866w3d Estimated Date of Delivery: 07/30/16 BP 126/70   Pulse 74   Wt 256 lb 9.6 oz (116.4 kg)   LMP 10/18/2015 (Approximate)   BMI 40.19 kg/m   Urinalysis: Negative HPI:  Doing well. [facility]. Was 1cm. Sugars have been low in am (70s) but feels symptomatic, so eats something sweet and throws it over. Had been on 2.5mg  BID- was switched to just 5mg  pm on Tues, states she feels this is too much at night. Wants to try 1.25mg  AM/2.5mg  PM.  BP, weight, and urine reviewed.  Reports good fm. Denies regular uc's, lof, vb, uti s/s. No complaints.  Fundal Height:  37 Fetal Heart rate:  135, reactive NST Edema:  trace  Reviewed labor s/s, fkc All questions were answered Assessment: 6166w3d A2DM Medication(s) Plans:  To try glyburide 1.25mg AM/2.5mg PM Treatment Plan:  Had growth u/s on Tues, 2x/wk testing, IOL @ 39wks- scheduled for 5/1 Follow up on Tues for high-risk OB appt and NST

## 2016-08-22 NOTE — Lactation Note (Signed)
This note was copied from a baby's chart. Lactation Consult  Mother's reason for visit:  Slow weight gain Visit Type:  Outpatient Appointment Notes:  104 week old baby with slow weight gain.  Mother has been primarily breastfeeding on one breast per session and pumping one breast at a time.  Baby latched easily and after approx 15 min fell asleep.  Encouraged mother to burp baby to wake and breastfeed on the other breast each feeding to increase volume. Consult:  Initial Lactation Consultant:  Hardie PulleyBerkelhammer, Benedetta Sundstrom Boschen  ________________________________________________________________________ Joan FloresBaby's Name:  Julia DoorErin Mowrer Date of Birth:  1980-09-19 Pediatrician:  Leandrew KoyanagiBurdine Gender:  female Gestational Age: <None> (At Birth) Birth Weight:    Weight at Discharge:  Weight: 4064 oz                      Date of Discharge:  07/25/2016    [facility]   07/23/16 0744  Weight: 4064 oz  [facility]  7 lb12.5 oz     Location:Pediatrician's office Weight today:  8 lb 2.8 oz. ________________________________________________________________________  Mother's Name: Burnis MedinJoanna Grace Eble Type of delivery:  Vaginal, Spontaneous Delivery Breastfeeding Experience:  P3 Maternal Medical Conditions:  Gestational diabetes mellitis Maternal Medications:  PNV, motrin  ________________________________________________________________________  Breastfeeding History (Post Discharge)  Frequency of breastfeeding:  q 2-3 hours usually only on one breast per feeding Duration of feeding:  10-20 min.  Supplementation  Formula:  Volume 30-60 ml Frequency:  3-4 times per day total volume per day:  90-240 ml       Brand: Similac  Breastmilk:  Volume 60-90 ml Frequency:  1-2 times per day Total volume per day:  60-180 ml  Method:  Bottle,   Pumping  Type of pump:  Medela pump in style Frequency:  1-2 times per day Volume:  60-90 ml  Infant Intake and Output  Assessment  Voids:  6+ in 24 hrs.  Color:  Clear yellow Stools:  1-2 in 24 hrs.  Color:  Green and Yellow  ________________________________________________________________________  Maternal Breast Assessment  Breast:  Soft Nipple:  Erect Pain level:  0 _______________________________________________________________________ Feeding Assessment/Evaluation  Initial feeding assessment:  Infant's oral assessment:  WNL  Positioning:  Cross cradle Left breast  LATCH documentation:  Latch:  2 = Grasps breast easily, tongue down, lips flanged, rhythmical sucking.  Audible swallowing:  1 = A few with stimulation  Type of nipple:  2 = Everted at rest and after stimulation  Comfort (Breast/Nipple):  2 = Soft / non-tender  Hold (Positioning):  2 = No assistance needed to correctly position infant at breast  LATCH score:  9  Attached assessment:  Deep  Lips flanged:  No.  Lips untucked:  Yes.    Suck assessment:  Displays both  Tools:  Pump Instructed on use and cleaning of tool:  Yes.    Pre-feed weight:  3708 g  (8 lb. 2.8 oz.) Post-feed weight:  3748 g (8 lb. 4.2 oz.) Amount transferred:  40 ml Amount supplemented:  0 ml  Additional Feeding Assessment -   Infant's oral assessment:  WNL  Positioning:  Cross cradle Right breast  LATCH documentation:  Latch:  2 = Grasps breast easily, tongue down, lips flanged, rhythmical sucking.  Audible swallowing:  1 = A few with stimulation  Type of nipple:  2 = Everted at rest and after stimulation  Comfort (Breast/Nipple):  2 = Soft / non-tender  Hold (Positioning):  1 = Assistance needed to correctly position infant at breast and maintain latch  LATCH score:  8   Attached assessment:  Deep  Lips flanged:  Yes.    Lips untucked:  Yes.    Suck assessment:  Displays both  Tools:  Pump Instructed on use and cleaning of tool:  No.  Pre-feed weight:  3748 g  (8 lb. 4.2 oz.) Post-feed weight:  3770 g (8 lb. 5 oz.) Amount  transferred:  22 ml Amount supplemented:  0 ml  Total amount transferred:  62 ml Total supplement given:  0 ml  Baby has had slow weight gain.  Very busy mother.  Baby latches fine but when she falls asleep after the first breast mother does not wake her to breastfeed on the 2nd breast.  Recommend that most feedings needs to be on both breasts for increased volume.  Suggest she continue to supplement baby at least 4-6 times a day with either breastmilk or formula until baby is gaining weight.  Discussed hands free pumping bra so mother can pump on both breasts.

## 2016-08-29 ENCOUNTER — Encounter: Payer: Self-pay | Admitting: Women's Health

## 2016-08-29 ENCOUNTER — Ambulatory Visit (INDEPENDENT_AMBULATORY_CARE_PROVIDER_SITE_OTHER): Payer: BLUE CROSS/BLUE SHIELD | Admitting: Women's Health

## 2016-08-29 DIAGNOSIS — Z8632 Personal history of gestational diabetes: Secondary | ICD-10-CM | POA: Diagnosis not present

## 2016-08-29 NOTE — Patient Instructions (Addendum)
You will have your sugar test next visit.  Please do not eat or drink anything after midnight the night before you come, not even water.  You will be here for at least two hours.     Check blood pressure two times a day, if consistently >140 on top or >90 on bottom let me know

## 2016-08-29 NOTE — Progress Notes (Signed)
Subjective:    Julia Johnson is a 36 y.o. 644P2012 Caucasian female who presents for a postpartum visit. She is 5 weeks postpartum following a spontaneous vaginal delivery at 39.0 gestational weeks. Anesthesia: nitrous. I have fully reviewed the prenatal and intrapartum course. Postpartum course has been uncomplicated. Baby's course has been uncomplicated. Baby is feeding by breast. Bleeding thin lochia. Bowel function is normal. Bladder function is normal. Patient is not sexually active. Last sexual activity: prior to birth of baby. Contraception method is plans condoms/rhythm method. Postpartum depression screening: negative. Score 5.  Last pap 01/17/16 and was neg. Concerned her bp is high normal. Intermittent back spasms, improves w/ ibuprofen.   The following portions of the patient's history were reviewed and updated as appropriate: allergies, current medications, past medical history, past surgical history and problem list.  Review of Systems Pertinent items are noted in HPI.   Vitals:   08/29/16 1112  BP: 132/82  Pulse: 94  Weight: 235 lb (106.6 kg)   Patient's last menstrual period was 10/18/2015 (approximate).  Objective:   General:  alert, cooperative and no distress   Breasts:  deferred, no complaints  Lungs: clear to auscultation bilaterally  Heart:  regular rate and rhythm  Abdomen: soft, nontender   Vulva: normal  Vagina: normal vagina  Cervix:  closed  Corpus: Well-involuted  Adnexa:  Non-palpable  Rectal Exam: Small external non-thrombosed hemorrhoids        Assessment:   Postpartum exam 5 wks s/p SVB after IOL for A2DM Breastfeeding Depression screening Contraception counseling   Plan:  Contraception: condoms and rhythm method Follow up in: 3 weeks for postpartum 2hr gtt (no visit), then Oct for physical, or earlier if needed Can check bp's bid, let us know if persistently elevated (sbp >140, dbp >90)  Marge DuncansBooker, Shakyra Mattera Randall CNM,  [facility] HospitalWHNP-BC 08/29/2016 11:18 AM

## 2016-08-30 ENCOUNTER — Ambulatory Visit: Payer: BLUE CROSS/BLUE SHIELD | Admitting: Women's Health

## 2016-09-13 ENCOUNTER — Telehealth: Payer: Self-pay | Admitting: *Deleted

## 2016-09-13 NOTE — Telephone Encounter (Signed)
Patient called stating she started having severe cramping and lower back pain last night. Pain was relieved by Tylenol and is very mild this am. No discomfort will urination, frequency or urgency. Bleeding is only spotting since delivery. Advised patient to continue to monitor and if pain gets more severe or starts having discomfort, to let us know or go to Urgent Care. Offered to discuss with provider to collect urine sample but patient declined stating she would wait it out over the weekend and if it became more severe she would call us back or go to the ER or urgent Care. Informed patient also about [facility]. Advised to push fluids, try AZO or cranberry juice. Verbalized understanding.

## 2016-09-19 ENCOUNTER — Other Ambulatory Visit: Payer: BLUE CROSS/BLUE SHIELD

## 2016-09-19 DIAGNOSIS — Z131 Encounter for screening for diabetes mellitus: Secondary | ICD-10-CM

## 2016-09-20 LAB — GLUCOSE TOLERANCE, 2 HOURS W/ 1HR
GLUCOSE, 1 HOUR: 123 mg/dL (ref 65–179)
GLUCOSE, 2 HOUR: 108 mg/dL (ref 65–152)
Glucose, Fasting: 94 mg/dL — ABNORMAL HIGH (ref 65–91)

## 2016-09-23 ENCOUNTER — Telehealth: Payer: Self-pay | Admitting: Obstetrics & Gynecology

## 2016-09-23 NOTE — Telephone Encounter (Signed)
Pt called stating that she would like to know the results of her glucose test. Please contact pt

## 2016-09-23 NOTE — Telephone Encounter (Signed)
Informed patient on 94 fasting glucose. Advised she will need HgbA1C per Selena BattenKim. Stated she can come in the morning. She will follow-up with her PCP.

## 2016-09-24 ENCOUNTER — Other Ambulatory Visit: Payer: BLUE CROSS/BLUE SHIELD

## 2016-09-24 DIAGNOSIS — R7301 Impaired fasting glucose: Secondary | ICD-10-CM | POA: Diagnosis not present

## 2016-09-25 LAB — HEMOGLOBIN A1C
ESTIMATED AVERAGE GLUCOSE: 105 mg/dL
Hgb A1c MFr Bld: 5.3 % (ref 4.8–5.6)

## 2016-09-26 ENCOUNTER — Encounter: Payer: Self-pay | Admitting: Obstetrics & Gynecology

## 2016-10-04 ENCOUNTER — Ambulatory Visit: Payer: Self-pay

## 2016-10-04 ENCOUNTER — Inpatient Hospital Stay (HOSPITAL_COMMUNITY): Admission: RE | Admit: 2016-10-04 | Payer: BLUE CROSS/BLUE SHIELD | Source: Ambulatory Visit

## 2016-10-04 NOTE — Lactation Note (Signed)
This note was copied from a baby's chart. Lactation Consult for Julia Johnson (DOB: [birth year]) and mother, Julia Johnson Consult:  Initial Lactation Consultant:  Julia Johnson, Julia Johnson  ________________________________________________________________________ BW: [id number] (7# 14.4oz) Wt on 5-31: 8# 2.8oz  09-22-16: 9# 8.5oz Today's weight: 4500g (without diaper) about 9# 14.7  ________________________________________________________________________  Mother's Name: Julia Johnson Type of delivery:  Vaginal, Spontaneous Delivery   Breastfeeding Experience: 18 months, 27 months Maternal Medical Conditions:  Celiac Disease Maternal Medications:  IB, PNV  ________________________________________________________________________  Breastfeeding History (Post Discharge)  Frequency of breastfeeding: q2-3h, on cue. Will go 5-6 hrs at night sometimes, but that is not necessarily typical  Supplementation  Formula:  2 full ounces       Brand: Similac, Pro-Senisitive  Method:  Bottle [place], quick bottle feeder  Infant Intake and Output Assessment  Voids:  Lots  Color:  Clear yellow Stools:  BMs qod or q2days.   ________________________________________________________________________  Maternal Breast Assessment  Breast:  Compressible Nipple:  Erect _______________________________________________________________________ Feeding Assessment/Evaluation  Initial feeding assessment:  Infant's oral assessment:  WNL. When crying, it appears that there may be posterior restriction. However, infant was observed to move tongue well when rooting & she extends her tongue beyond gum line when latching. Mom has no discomfort w/latch & she was able to transfer 70ml in less than 10 minutes.  Attached assessment:  Deep  Lips flanged:  Yes.  Bottom lip more than right  Lips untucked:  Yes.    Suck assessment:  Nutritive  Pre-feed weight: 4524g  (9 lb. 15.6 oz.) Post-feed  weight: 4594 g  Amount transferred: 70 ml R breast, football, less than 10 min  Pre-feed weight: 4594 g  Post-feed weight: 4610 g  Amount transferred:16 ml L side in 7 min  Pre-feed weight: 4610 g  Post-feed weight: 4638 g Amount transferred: 28 ml R side in 8 min  Pre-feed weight: 4632g Post-feed weight: 4646 Amount transferred: 14ml L side in 6 min  Total amount transferred: 128 ml  Julia Johnson has only gained about 6 ounces over the last 12 days. Mom presented b/c she was concerned about milk supply;  infant doesn't seem satisfied with the breast & will still take 2 oz of formula after some feedings. Mom was also concerned about some spitting up after feedings. Mom has no complaints about latch. Mom does mention that her menses recommenced this week.   Infant was observed to latch w/ease. She has short feedings, but she is able to transfer efficiently (e.g. almost 2.5 oz in less than 10 minutes at one point). Her feeding pattern consists of frequent, short feedings, alternating between breasts & then returning to those breasts during the same feeding (i.e., she latched to both breasts twice during this feeding session). By allowing her to do so, Julia Johnson was able to transfer more than 4 oz. Because Mom has 2 other children & a husband in a wheel chair, Mom is not always able to allow Julia Johnson such time at the breast (Mom was sitting for 1 hour, but active feeding time was only about 31 minutes). Mom does not have a milk supply issue; she has a "time supply" issue. Mom & I discussed different strategies to feed Julia Johnson/obtain milk, but Mom understands that Julia Johnson needs to gain at least 0.75 oz/day. Mom will likely try a variety of strategies, but she was encouraged to use formula if she doesn't have enough time to sit with Julia Johnson at the breast or to pump. Mom  does prefer a hand pump over an electric pump, so I provided her with a hand pump.   Mom does report that Julia Johnson tends to "pop off" the L  breast. That was observed when Julia Johnson was in football hold, but not when using the cradle hold. It is possible that Julia Johnson prefers to feed with her R shoulder down, instead of her L shoulder. Mom will observe.  I did observe Julia Johnson spit up after feeding, but I would consider each emesis a small amount. I do not believe the spitting up is the cause of the slow weight gain.   Next LC appt is in 1 week.   Julia Hew, RN, IBCLC

## 2016-10-11 ENCOUNTER — Ambulatory Visit: Payer: Self-pay

## 2016-10-11 NOTE — Lactation Note (Signed)
This note was copied from a baby's chart. Lactation Consult - baby Julia Johnson, mom Julia Johnson , dad, and 2 sisters present at 3:30 pm   Mother's reason for visit: F/U  Visit Type:  F/U feeding and weight check  Appointment Notes: F/U from 7/13 , referral on chart , no answer , no voice mail  Consult:  Follow-Up Lactation Consultant:  Matilde Sprang Abie Cheek  ________________________________________________________________________ Joan Flores Name:  Julia Johnson Date of Birth:  07/23/2016 Pediatrician: DR. Quintin Alto  Gender:  female Gestational Age: [redacted]w[redacted]d (At Birth) Birth Weight:  7 lb 14.4 oz (3583 g) Weight at Discharge:                          Date of Discharge:   Weight on 5/ 31 - 8.2.8 oz  7/1-9-8.5 oz  7/13-9-14.7 oz  7/18 ( WIC ) - 10-5 oz   [facility]: 10.5 oz     Location: WIC office  Weight today: 10.3 oz , 4620 g    ________________________________________________________________________  Mother's Name: Julia Johnson Type of delivery:  Vaginal, Spontaneous Delivery Breastfeeding Experience:  3rd baby ( 1st baby 18 months , 2nd baby 49 months )  Maternal Medical Conditions:  Celiac  Maternal Medications:  PNV ,   ________________________________________________________________________  Breastfeeding History (Post Discharge)  Frequency of breastfeeding: per mom baby feeds every 2-3 hours , nights may sleep 5 hours to start  And then feeds , wakes up to hours after wards, and her best feeding at the breast is the morning feeding.  Duration of feeding: per mom length of times varies - she may feed 10 mins , come off and feed several times in a hour  And then stretch her feeding to 3 hours. If she goes to the breast 1st , i've been offering both and then supplementing and she takes about 2 oz. If it is a feeding from a bottle only she will take 506 oz of EBM , or Formula, or combo of both.  Pumping - I haven't  pulled my DEBP pump out yet. I use the hand pump given to me by kim th e consultant and it seems to work well for me. I can pump off 3-4 oz at a pumping and then sometimes just 1 oz. If my breast are really full in the am I can pump off 5 oz .  Supplementing - with EBM or Similiac prosensitive with standard [employer] nipple.   Infant Intake and Output Assessment  Voids:  5-6 or greater  in 24 hrs.  Color:  Clear yellow Stools: 1-2 every other day  in 24 hrs.  Color:  Brown and Yellow , sometimes tan   ________________________________________________________________________  Maternal Breast Assessment  Breast:  Soft  To filling  Nipple:  Erect Pain level:  0 Pain interventions:  Expressed breast milk  _______________________________________________________________________ Feeding Assessment/Evaluation  Initial feeding assessment:  Infant's oral assessment:  Good tongue mobility noted   Positioning:  Football fed for 10 mins in a consistent swallowing pattern  Right breast  LATCH documentation:  Latch:  2 = Grasps breast easily, tongue down, lips flanged, rhythmical sucking.  Audible swallowing:  2 = Spontaneous and intermittent  Type of nipple:  2 = Everted at rest and after stimulation  Comfort (Breast/Nipple):  1 = Filling, red/small blisters or bruises, mild/mod discomfort  Hold (Positioning):  1 = Assistance needed to correctly position infant  at breast and maintain latch  ( LC flipped upper lip to flanged position and eased baby into the breast for depth )   LATCH score: 8   Attached assessment:  Shallow@ 1st , showed mom hor to bring the baby in closer and the baby tolerated well   Lips flanged:  No.  Lips untucked:  Yes.    Suck assessment:  Nutritive  Tools:  None   Pre-feed weight:  4620g , 10.3 oz  Post-feed weight: 4678 g , 10.5 oz  Amount transferred: 58 ml  Amount supplemented:  After 2nd breast   Additional Feeding Assessment -   Positioning:  Cradle-   Left breast  LATCH documentation:  Latch:  2 = Grasps breast easily, tongue down, lips flanged, rhythmical sucking.  Audible swallowing:  2 = Spontaneous and intermittent  Type of nipple:  2 = Everted at rest and after stimulation  Comfort (Breast/Nipple):  2 = Soft / non-tender  Hold (Positioning):  1 = Assistance needed to correctly position infant at breast and maintain latch  LATCH score:  9   Attached assessment:  Shallow - LC showed mom how to make sure the upper lip is flanged   Lips flanged:  No.  Lips untucked:  Yes.    Suck assessment:  Nutritive and Nonnutritive  Tools:  None   Pre-feed weight: 4678 g , 10.5 oz  Post-feed weight: 4682 g , 10.5.2 oz   Amount transferred: 4ml  ( baby released on her own and acted like she was finished and then got hungry )  Amount supplemented: 58 ml   Total amount pumped post feed:  Mom did not post pump   Total amount transferred: 62 ml  Total supplement given: 58 ml of formula  Total volume = 120 ml ( 4 oz )   Lactation Impression:  Per mom had fed the baby from the breast 1 hour before the [facility] at 2:30 pm and the consult was at 3:30 pm. Baby presented sound asleep. 30 mins into the consult started waking up and acting hungry , fussy and crying.  Per mom had a busy day with dentist appts. With her older children, and planned to just breast fed during the day and forgot formula and a bottle. Also was all out of EBM. Per mom the baby has fed often today.  This LC appt. Today is a F/U from last Friday. Via the conversation with mom she is trying her best to follow the LC's plan from last Friday , but often finds it difficult to do extra pumping. Using the hand pump has worked for time factor. And she has yet to get her DEBP out to use even though it was recommended 3 LC consults ago.  This LC praised mom for her efforts, and recommended at least once of twice a day when she has down time without the 2 other active kids to try  to use her DEBP , if not to continue to use her hand pump on each breast for extra stimulation. And to supplement , please see LC plan below.  LC noted small amount of spitting after 1st breast . 2nd breast and supplementing.  LC stressed the importance of obtaining a weekly weight check for the baby to make sure the baby continues to gain. Made 3 suggestions - f/u as a LC O/P for weight check, [facility] , and mom requested and prefer due to close  location to have the weekly weight check at her [facility] in [place].  Per mom planned to call [facility] office today to schedule an appt.    Lactation plan of care: ( this plan reviewed with mom )  Mardene Celeste - needs to feed at least every 3-4 hours and with feeding cues  Options - breast feed - offering the 2nd  Breast , making sure she has a deep latch , nose close to breast  Watch for non - nutritive hanging out latched at the breast , don't be afraid to take her off , burp , offer 2nd breast.  Feeding from a bottle - 4-6 oz expressed milk if available or formula or combination Pumping - DEBP  , especially when baby gets entire for a feeding from a bottle  Consider power pumping - 2 options - 10 mins on / off over 60 mins ,or 20 mins on / 10 mins off over 60 mins.  Importance - Protect milk supply and feed the baby to gain. LC stressed this to mom )

## 2016-12-25 DIAGNOSIS — K900 Celiac disease: Secondary | ICD-10-CM | POA: Diagnosis not present

## 2017-02-17 ENCOUNTER — Encounter: Payer: Self-pay | Admitting: Women's Health

## 2017-02-20 ENCOUNTER — Encounter: Payer: Self-pay | Admitting: Women's Health

## 2017-02-20 ENCOUNTER — Ambulatory Visit (INDEPENDENT_AMBULATORY_CARE_PROVIDER_SITE_OTHER): Payer: Medicaid Other | Admitting: Women's Health

## 2017-02-20 VITALS — BP 130/80 | HR 96 | Ht 67.0 in | Wt 241.0 lb

## 2017-02-20 DIAGNOSIS — N920 Excessive and frequent menstruation with regular cycle: Secondary | ICD-10-CM | POA: Diagnosis not present

## 2017-02-20 MED ORDER — NORETHINDRONE 0.35 MG PO TABS: 1.0000 | ORAL_TABLET | Freq: Every day | ORAL | 3 refills | Status: DC

## 2017-02-20 NOTE — Progress Notes (Signed)
   GYN VISIT Patient name: Julia Johnson MRN [id number]  Date of birth: 10/16/1980 Chief Complaint:   gyn visit (heavy period)  History of Present Illness:   Julia Johnson is a 36 y.o. [phone] Caucasian female being seen today for report of heavy periods x 3 months. Baby is 807 months old, quit breastfeeding about 3 months ago. Periods are lasting about 5 days, 1st day normal flow, 2nd & 3rd day changes super pad q 1-2hrs, some small stringy clots/tissue, not much cramping. Also wants more reliable birth control, right now they are using condoms. COCs have made her depression worse in the past, so doesn't really want to do that, is scared of risks w/ IUD, doesn't want depo d/t r/f weight gain, discussed nexplanon can potentially cause weight gain/worsening in depression and may make periods irregular- does not desire nexplanon. Wants to try POPs.  Just had labs w/ pcp, hgb was 13, tsh was normal.   Patient's last menstrual period was 02/15/2017. The current method of family planning is condoms  Last pap 01/17/16. Results were:  normal Review of Systems:   Pertinent items are noted in HPI Denies fever/chills, dizziness, headaches, visual disturbances, fatigue, shortness of breath, chest pain, abdominal pain, vomiting, abnormal vaginal discharge/itching/odor/irritation, problems with periods, bowel movements, urination, or intercourse unless otherwise stated above.  Pertinent History Reviewed:  Reviewed past medical,surgical, social, obstetrical and family history.  Reviewed problem list, medications and allergies. Physical Assessment:   Vitals:   02/20/17 1136  BP: 130/80  Pulse: 96  Weight: 241 lb (109.3 kg)  Height: 5\' 7"  (1.702 m)  Body mass index is 37.75 kg/m.       Physical Examination:   General appearance: alert, well appearing, and in no distress  Mental status: alert, oriented to person, place, and time  Skin: warm & dry   Cardiovascular: normal heart rate noted  Respiratory:  normal respiratory effort, no distress  Abdomen: soft, non-tender   Pelvic: exam declined by the patient  Extremities: no edema   No results found for this or any previous visit (from the past 24 hour(s)).  Assessment & Plan:  1) Menorrhagia w/ regular cycle> pt wants to try POPs, rx micronor 3pk w/ 3RF, give it a good 3 months, if not improving, let us know  No orders of the defined types were placed in this encounter.   Return for June for , Physical.  Marge DuncansBooker, Akshita Italiano Randall CNM, [facility] 02/20/2017 1:11 PM

## 2017-02-20 NOTE — Patient Instructions (Signed)
Set alarm to remind you to take pills at exact same time daily  Norethindrone tablets (contraception) What is this medicine? NORETHINDRONE (nor eth IN drone) is an oral contraceptive. The product contains a female hormone known as a progestin. It is used to prevent pregnancy. This medicine may be used for other purposes; ask your health care provider or pharmacist if you have questions. COMMON BRAND NAME(S): Camila, Deblitane 28-Day, Errin, Heather, PomeroyJencycla, Jolivette, [place]za, Nor-QD, Nora-BE, Norlyroc, Ortho Micronor, Hewlett-PackardSharobel 28-Day What should I tell my health care provider before I take this medicine? They need to know if you have any of these conditions: -blood vessel disease or blood clots -breast, cervical, or vaginal cancer -diabetes -heart disease -kidney disease -liver disease -mental depression -migraine -seizures -stroke -vaginal bleeding -an unusual or allergic reaction to norethindrone, other medicines, foods, dyes, or preservatives -pregnant or trying to get pregnant -breast-feeding How should I use this medicine? Take this medicine by mouth with a glass of water. You may take it with or without food. Follow the directions on the prescription label. Take this medicine at the same time each day and in the order directed on the package. Do not take your medicine more often than directed. Contact your pediatrician regarding the use of this medicine in children. Special care may be needed. This medicine has been used in female children who have started having menstrual periods. A patient package insert for the product will be given with each prescription and refill. Read this sheet carefully each time. The sheet may change frequently. Overdosage: If you think you have taken too much of this medicine contact a poison control center or emergency room at once. NOTE: This medicine is only for you. Do not share this medicine with others. What if I miss a dose? Try not to miss a  dose. Every time you miss a dose or take a dose late your chance of pregnancy increases. When 1 pill is missed (even if only 3 hours late), take the missed pill as soon as possible and continue taking a pill each day at the regular time (use a back up method of birth control for the next 48 hours). If more than 1 dose is missed, use an additional birth control method for the rest of your pill pack until menses occurs. Contact your health care professional if more than 1 dose has been missed. What may interact with this medicine? Do not take this medicine with any of the following medications: -amprenavir or fosamprenavir -bosentan This medicine may also interact with the following medications: -antibiotics or medicines for infections, especially rifampin, rifabutin, rifapentine, and griseofulvin, and possibly penicillins or tetracyclines -aprepitant -barbiturate medicines, such as phenobarbital -carbamazepine -felbamate -modafinil -oxcarbazepine -phenytoin -ritonavir or other medicines for HIV infection or AIDS -St. John's wort -topiramate This list may not describe all possible interactions. Give your health care provider a list of all the medicines, herbs, non-prescription drugs, or dietary supplements you use. Also tell them if you smoke, drink alcohol, or use illegal drugs. Some items may interact with your medicine. What should I watch for while using this medicine? Visit your doctor or health care professional for regular checks on your progress. You will need a regular breast and pelvic exam and Pap smear while on this medicine. Use an additional method of birth control during the first cycle that you take these tablets. If you have any reason to think you are pregnant, stop taking this medicine right away and contact your doctor or  health care professional. If you are taking this medicine for hormone related problems, it may take several cycles of use to see improvement in your  condition. This medicine does not protect you against HIV infection (AIDS) or any other sexually transmitted diseases. What side effects may I notice from receiving this medicine? Side effects that you should report to your doctor or health care professional as soon as possible: -breast tenderness or discharge -pain in the abdomen, chest, groin or leg -severe headache -skin rash, itching, or hives -sudden shortness of breath -unusually weak or tired -vision or speech problems -yellowing of skin or eyes Side effects that usually do not require medical attention (report to your doctor or health care professional if they continue or are bothersome): -changes in sexual desire -change in menstrual flow -facial hair growth -fluid retention and swelling -headache -irritability -nausea -weight gain or loss This list may not describe all possible side effects. Call your doctor for medical advice about side effects. You may report side effects to FDA at 1-800-FDA-1088. Where should I keep my medicine? Keep out of the reach of children. Store at room temperature between 15 and 30 degrees C (59 and 86 degrees F). Throw away any unused medicine after the expiration date. NOTE: This sheet is a summary. It may not cover all possible information. If you have questions about this medicine, talk to your doctor, pharmacist, or health care provider.  2018 Elsevier/Gold Standard (2011-11-29 16:41:35)

## 2017-03-20 ENCOUNTER — Ambulatory Visit: Payer: Medicaid Other | Admitting: Women's Health

## 2017-05-09 ENCOUNTER — Encounter: Payer: Self-pay | Admitting: Women's Health

## 2017-05-15 ENCOUNTER — Other Ambulatory Visit: Payer: Self-pay | Admitting: Women's Health

## 2017-05-15 ENCOUNTER — Ambulatory Visit (INDEPENDENT_AMBULATORY_CARE_PROVIDER_SITE_OTHER): Payer: Medicaid Other | Admitting: Women's Health

## 2017-05-15 ENCOUNTER — Encounter: Payer: Self-pay | Admitting: Women's Health

## 2017-05-15 VITALS — BP 126/80 | HR 90 | Ht 67.0 in | Wt 242.0 lb

## 2017-05-15 DIAGNOSIS — R5383 Other fatigue: Secondary | ICD-10-CM | POA: Diagnosis not present

## 2017-05-15 DIAGNOSIS — N920 Excessive and frequent menstruation with regular cycle: Secondary | ICD-10-CM

## 2017-05-15 DIAGNOSIS — K625 Hemorrhage of anus and rectum: Secondary | ICD-10-CM | POA: Diagnosis not present

## 2017-05-15 NOTE — Progress Notes (Signed)
GYN VISIT Patient name: Julia Johnson MRN [id number]  Date of birth: 11/13/1980 Chief Complaint:   having heavy periods (passing tissue and clots; rectal bleeding; discomfort )  History of Present Illness:   Julia Johnson is a 37 y.o. [phone] Caucasian female being seen today for report of heavy periods. Was seen 11/29 for same, started on micronor at that time, as COCs have made depression worse in past and didn't want depo/nexplanon/IUD.  Reports periods as regular, last 5-6d, heavy for 1st 4 days, goes through box of 14 overnight pads in 36hrs. Clots and tissue. Not much cramping, but does have some pressure during menses. Used to think she had mittleschmertz pain, but now is random throughout the month. Reports she also bleeds heavily from rectum during periods, she is absolutely sure it is coming from rectum. She does not have rectal bleeding at any other time during the month. No constipation. Some diarrhea during periods. Feels very fatigued for about 1 week after period. Still taking pnv.    Patient's last menstrual period was 05/08/2017. The current method of family planning is oral progesterone-only contraceptive. Last pap 01/17/16. Results were:  normal Review of Systems:   Pertinent items are noted in HPI Denies fever/chills, dizziness, headaches, visual disturbances, fatigue, shortness of breath, chest pain, abdominal pain, vomiting, abnormal vaginal discharge/itching/odor/irritation, problems with periods, bowel movements, urination, or intercourse unless otherwise stated above.  Pertinent History Reviewed:  Reviewed past medical,surgical, social, obstetrical and family history.  Reviewed problem list, medications and allergies. Physical Assessment:   Vitals:   05/15/17 1152  BP: 126/80  Pulse: 90  Weight: 242 lb (109.8 kg)  Height: 5\' 7"  (1.702 m)  Body mass index is 37.9 kg/m.       Physical Examination:   General appearance: alert, well appearing, and in no  distress  Mental status: alert, oriented to person, place, and time  Skin: warm & dry   Cardiovascular: normal heart rate noted  Respiratory: normal respiratory effort, no distress  Abdomen: soft, non-tender   Pelvic: VULVA: normal appearing vulva with no masses, tenderness or lesions, VAGINA: normal appearing vagina with normal color and discharge, no lesions, CERVIX: normal appearing cervix without discharge, small dark purplish/black dot at 12 o'clock suspicious for endometriosis, UTERUS: uterus is normal size, shape, consistency and nontender, ADNEXA: normal adnexa in size, nontender and no masses, RECTAL: normal rectal, no masses  Extremities: no edema   Procedure: Discussed all w/ JVF, co-exam w/ him, recommends cervical biopsy of small spot on cx to see if endometriosis. Informed consent obtained, punch biopsy obtained,  No bleeding, pt tolerated procedure well  No results found for this or any previous visit (from the past 24 hour(s)).  Assessment & Plan:  1) Menorrhagia w/ regular cycle> will get CBC today, schedule pelvic u/s, send cervical biopsy. Will discuss poc after results back.   2) Rectal bleeding during period> discussed w/ JVF, feels it could be endometriosis of the bowel. If cervical biopsy returns + for endometriosis, will send to GI for TCS while on menses, to get biopsy of part of bowel that bleeds to confirm  Meds: No orders of the defined types were placed in this encounter.   Orders Placed This Encounter  Procedures  . US PELVIS (TRANSABDOMINAL ONLY)  . US PELVIS TRANSVANGINAL NON-OB (TV ONLY)  . CBC  . PR BIOPSY CERVIX, 1 OR MORE, OR EXCISION OF LESION    Return for asap for pelvic u/s and f/u w/ me.  Cala BradfordKimberly  Mady Gemma CNM, [facility] 05/15/2017 2:36 PM

## 2017-05-16 ENCOUNTER — Ambulatory Visit (INDEPENDENT_AMBULATORY_CARE_PROVIDER_SITE_OTHER): Payer: Medicaid Other

## 2017-05-16 ENCOUNTER — Encounter: Payer: Self-pay | Admitting: Women's Health

## 2017-05-16 ENCOUNTER — Ambulatory Visit (INDEPENDENT_AMBULATORY_CARE_PROVIDER_SITE_OTHER): Payer: Medicaid Other | Admitting: Women's Health

## 2017-05-16 VITALS — BP 136/80 | HR 97 | Ht 67.0 in | Wt 241.0 lb

## 2017-05-16 DIAGNOSIS — K625 Hemorrhage of anus and rectum: Secondary | ICD-10-CM | POA: Diagnosis not present

## 2017-05-16 DIAGNOSIS — N920 Excessive and frequent menstruation with regular cycle: Secondary | ICD-10-CM

## 2017-05-16 LAB — CBC
Hematocrit: 39.6 % (ref 34.0–46.6)
Hemoglobin: 13.5 g/dL (ref 11.1–15.9)
MCH: 29.2 pg (ref 26.6–33.0)
MCHC: 34.1 g/dL (ref 31.5–35.7)
MCV: 86 fL (ref 79–97)
PLATELETS: 195 10*3/uL (ref 150–379)
RBC: 4.63 x10E6/uL (ref 3.77–5.28)
RDW: 14.3 % (ref 12.3–15.4)
WBC: 8.4 10*3/uL (ref 3.4–10.8)

## 2017-05-16 NOTE — Progress Notes (Signed)
GYN VISIT Patient name: Julia Johnson MRN [id number]  Date of birth: Nov 10, 1980 Chief Complaint:   Follow-up (gyn u/s)  History of Present Illness:   Julia Johnson is a 37 y.o. [phone] Caucasian female being seen today for f/u after pelvic u/s today done for menorrhagia w/ regular cycle. She was seen yesterday for this complaint.      Patient's last menstrual period was 05/08/2017. The current method of family planning is oral progesterone-only contraceptive. Last pap 01/17/16. Results were:  normal Review of Systems:   Pertinent items are noted in HPI Denies fever/chills, dizziness, headaches, visual disturbances, fatigue, shortness of breath, chest pain, abdominal pain, vomiting, abnormal vaginal discharge/itching/odor/irritation, problems with periods, bowel movements, urination, or intercourse unless otherwise stated above.  Pertinent History Reviewed:  Reviewed past medical,surgical, social, obstetrical and family history.  Reviewed problem list, medications and allergies. Physical Assessment:   Vitals:   05/16/17 1108  BP: 136/80  Pulse: 97  Weight: 241 lb (109.3 kg)  Height: 5\' 7"  (1.702 m)  Body mass index is 37.75 kg/m.       Physical Examination:   General appearance: alert, well appearing, and in no distress  Mental status: alert, oriented to person, place, and time  Skin: warm & dry   Cardiovascular: normal heart rate noted  Respiratory: normal respiratory effort, no distress  Abdomen: soft, non-tender   Pelvic: examination not indicated  Extremities: no edema   Results for orders placed or performed in visit on 05/15/17 (from the past 24 hour(s))  CBC   Collection Time: 05/15/17  1:02 PM  Result Value Ref Range   WBC 8.4 3.4 - 10.8 x10E3/uL   RBC 4.63 3.77 - 5.28 x10E6/uL   Hemoglobin 13.5 11.1 - 15.9 g/dL   Hematocrit 13.039.6 86.534.0 - 46.6 %   MCV 86 79 - 97 fL   MCH 29.2 26.6 - 33.0 pg   MCHC 34.1 31.5 - 35.7 g/dL   RDW 78.414.3 69.612.3 - 29.515.4 %   Platelets 195  150 - 379 x10E3/uL    Today's Pelvic U/S:  Julia Johnson is a 37 y.o. [id number] LMP 05/08/2017,she is here for a pelvic sonogram for menorrhagia.  Uterus                    8.6 x 5.2 x 6 cm, Vol 140 ml,homogeneous retroflexed uterus,wnl Endometrium          8 mm, symmetrical, wnl Right ovary             2.6 x 2.2 x 2 cm, wnl Left ovary                3.3 x 1.7 x 2.2 cm, wnl No free fluid  Technician Comments: PELVIC US TA/TV:homogeneous retroflexed uterus,wnl,normal ovaries bilat,ovaries appear mobile,no free fluid,no pain during ultrasound,EEC 8 MM Amber Flora LippsJ Carl 05/16/2017 11:14 AM Assessment & Plan:  1) Menorrhagia w/ regular cycle> normal gyn u/s, hgb good. Cevical biopsy still pending. Will discuss poc once biopsy back   2) Rectal bleeding w/ periods> discussed w/ JVF yesterday, if cervical biopsy comes back positive for endometriosis, consider TCS while on menses to get biopsy. Pt will contact her celiac md who usually does her referrals to GI for her. Discussed even if biopsy is neg for endometriosis she needs to see GI/consider TCS d/t rectal bleeding  Meds: No orders of the defined types were placed in this encounter.   No orders of the defined types were placed in this  encounter.   Return for will call.  Cheral Marker CNM, [facility] 05/16/2017 12:00 PM

## 2017-05-16 NOTE — Progress Notes (Signed)
PELVIC US TA/TV:homogeneous retroflexed uterus,wnl,normal ovaries bilat,ovaries appear mobile,no free fluid,no pain during ultrasound,EEC 8 MM

## 2017-05-19 ENCOUNTER — Telehealth: Payer: Self-pay | Admitting: Women's Health

## 2017-05-19 ENCOUNTER — Encounter: Payer: Self-pay | Admitting: Women's Health

## 2017-05-19 NOTE — Telephone Encounter (Signed)
Called pt, notified her of neg cervical biopsy. Discussed all w/ LHE, recommends GI consult w/ TCS as soon as possible for rectal bleeding. Pt states she has already contacted her specialist to get referral to GI. Discussed options for menorrhagia, IUD best option as she doesn't want to do any of other options. If finished childbearing, LHE recommends ablation/BTL. Pt states she is leaning towards IUD, will call us after gets TCS.  Julia Johnson, CNM, [facility] 05/19/2017 5:09 PM

## 2017-05-28 ENCOUNTER — Encounter: Payer: Self-pay | Admitting: Women's Health

## 2017-07-01 ENCOUNTER — Encounter: Payer: Self-pay | Admitting: Women's Health

## 2017-07-02 ENCOUNTER — Telehealth: Payer: Self-pay | Admitting: *Deleted

## 2017-07-02 NOTE — Telephone Encounter (Signed)
   Hi Kim,   So...the saga continues. I had my colonoscopy on April 5. They found internal hemroids and diverticulosis. I started my period late Sunday and [facility]. We were here for Rachel's endoscopy and the bleeding kept getting worse--no one was in clinic today so Dr. Jiles GarterStefi's nurse in adult GI said I should be checked out. The strange thing is my hemoglobin is normal, just like in February, despite all the bleeding. The ED doc says it's possible this could be a hormonal thing but I'll message Dr. Norvel RichardsStefi to see what else needs to be done to determine cause of rectal bleeding. I can't go through another month of this craziness! Do we have to have an answer about the bleeding before trying an IUD? When can one be placed?

## 2017-07-03 ENCOUNTER — Ambulatory Visit (INDEPENDENT_AMBULATORY_CARE_PROVIDER_SITE_OTHER): Payer: Medicaid Other | Admitting: Women's Health

## 2017-07-03 ENCOUNTER — Encounter: Payer: Self-pay | Admitting: Women's Health

## 2017-07-03 VITALS — BP 130/80 | HR 97 | Wt 249.0 lb

## 2017-07-03 DIAGNOSIS — Z3202 Encounter for pregnancy test, result negative: Secondary | ICD-10-CM | POA: Diagnosis not present

## 2017-07-03 DIAGNOSIS — Z30430 Encounter for insertion of intrauterine contraceptive device: Secondary | ICD-10-CM

## 2017-07-03 LAB — POCT URINE PREGNANCY: Preg Test, Ur: NEGATIVE

## 2017-07-03 MED ORDER — LEVONORGESTREL 19.5 MCG/DAY IU IUD
INTRAUTERINE_SYSTEM | Freq: Once | INTRAUTERINE | Status: AC
Start: 2017-07-03 — End: 2017-07-03
  Administered 2017-07-03: 16:00:00 via INTRAUTERINE

## 2017-07-03 NOTE — Patient Instructions (Signed)
 Nothing in vagina for 3 days (no sex, douching, tampons, etc...)  Check your strings once a month to make sure you can feel them, if you are not able to please let us know  If you develop a fever of 100.4 or more in the next few weeks, or if you develop severe abdominal pain, please let us know  Use a backup method of birth control, such as condoms, for 2 weeks    Intrauterine Device Insertion, Care After This sheet gives you information about how to care for yourself after your procedure. Your health care provider may also give you more specific instructions. If you have problems or questions, contact your health care provider. What can I expect after the procedure? After the procedure, it is common to have:  Cramps and pain in the abdomen.  Light bleeding (spotting) or heavier bleeding that is like your menstrual period. This may last for up to a few days.  Lower back pain.  Dizziness.  Headaches.  Nausea.  Follow these instructions at home:  Before resuming sexual activity, check to make sure that you can feel the IUD string(s). You should be able to feel the end of the string(s) below the opening of your cervix. If your IUD string is in place, you may resume sexual activity. ? If you had a hormonal IUD inserted more than 7 days after your most recent period started, you will need to use a backup method of birth control for 7 days after IUD insertion. Ask your health care provider whether this applies to you.  Continue to check that the IUD is still in place by feeling for the string(s) after every menstrual period, or once a month.  Take over-the-counter and prescription medicines only as told by your health care provider.  Do not drive or use heavy machinery while taking prescription pain medicine.  Keep all follow-up visits as told by your health care provider. This is important. Contact a health care provider if:  You have bleeding that is heavier or lasts longer than  a normal menstrual cycle.  You have a fever.  You have cramps or abdominal pain that get worse or do not get better with medicine.  You develop abdominal pain that is new or is not in the same area of earlier cramping and pain.  You feel lightheaded or weak.  You have abnormal or bad-smelling discharge from your vagina.  You have pain during sexual activity.  You have any of the following problems with your IUD string(s): ? The string bothers or hurts you or your sexual partner. ? You cannot feel the string. ? The string has gotten longer.  You can feel the IUD in your vagina.  You think you may be pregnant, or you miss your menstrual period.  You think you may have an STI (sexually transmitted infection). Get help right away if:  You have flu-like symptoms.  You have a fever and chills.  You can feel that your IUD has slipped out of place. Summary  After the procedure, it is common to have cramps and pain in the abdomen. It is also common to have light bleeding (spotting) or heavier bleeding that is like your menstrual period.  Continue to check that the IUD is still in place by feeling for the string(s) after every menstrual period, or once a month.  Keep all follow-up visits as told by your health care provider. This is important.  Contact your health care provider if   you have problems with your IUD string(s), such as the string getting longer or bothering you or your sexual partner. This information is not intended to replace advice given to you by your health care provider. Make sure you discuss any questions you have with your health care provider. Document Released: 11/07/2010 Document Revised: 01/31/2016 Document Reviewed: 01/31/2016 Elsevier Interactive Patient Education  2017 Elsevier Inc.  Levonorgestrel intrauterine device (IUD) What is this medicine? LEVONORGESTREL IUD (LEE voe nor jes trel) is a contraceptive (birth control) device. The device is placed  inside the uterus by a healthcare professional. It is used to prevent pregnancy. This device can also be used to treat heavy bleeding that occurs during your period. This medicine may be used for other purposes; ask your health care provider or pharmacist if you have questions. COMMON BRAND NAME(S): Kyleena, LILETTA, Mirena, Skyla What should I tell my health care provider before I take this medicine? They need to know if you have any of these conditions: -abnormal Pap smear -cancer of the breast, uterus, or cervix -diabetes -endometritis -genital or pelvic infection now or in the past -have more than one sexual partner or your partner has more than one partner -heart disease -history of an ectopic or tubal pregnancy -immune system problems -IUD in place -liver disease or tumor -problems with blood clots or take blood-thinners -seizures -use intravenous drugs -uterus of unusual shape -vaginal bleeding that has not been explained -an unusual or allergic reaction to levonorgestrel, other hormones, silicone, or polyethylene, medicines, foods, dyes, or preservatives -pregnant or trying to get pregnant -breast-feeding How should I use this medicine? This device is placed inside the uterus by a health care professional. Talk to your pediatrician regarding the use of this medicine in children. Special care may be needed. Overdosage: If you think you have taken too much of this medicine contact a poison control center or emergency room at once. NOTE: This medicine is only for you. Do not share this medicine with others. What if I miss a dose? This does not apply. Depending on the brand of device you have inserted, the device will need to be replaced every 3 to 5 years if you wish to continue using this type of birth control. What may interact with this medicine? Do not take this medicine with any of the following medications: -amprenavir -bosentan -fosamprenavir This medicine may also  interact with the following medications: -aprepitant -armodafinil -barbiturate medicines for inducing sleep or treating seizures -bexarotene -boceprevir -griseofulvin -medicines to treat seizures like carbamazepine, ethotoin, felbamate, oxcarbazepine, phenytoin, topiramate -modafinil -pioglitazone -rifabutin -rifampin -rifapentine -some medicines to treat HIV infection like atazanavir, efavirenz, indinavir, lopinavir, nelfinavir, tipranavir, ritonavir -St. John's wort -warfarin This list may not describe all possible interactions. Give your health care provider a list of all the medicines, herbs, non-prescription drugs, or dietary supplements you use. Also tell them if you smoke, drink alcohol, or use illegal drugs. Some items may interact with your medicine. What should I watch for while using this medicine? Visit your doctor or health care professional for regular check ups. See your doctor if you or your partner has sexual contact with others, becomes HIV positive, or gets a sexual transmitted disease. This product does not protect you against HIV infection (AIDS) or other sexually transmitted diseases. You can check the placement of the IUD yourself by reaching up to the top of your vagina with clean fingers to feel the threads. Do not pull on the threads. It is a good habit   to check placement after each menstrual period. Call your doctor right away if you feel more of the IUD than just the threads or if you cannot feel the threads at all. The IUD may come out by itself. You may become pregnant if the device comes out. If you notice that the IUD has come out use a backup birth control method like condoms and call your health care provider. Using tampons will not change the position of the IUD and are okay to use during your period. This IUD can be safely scanned with magnetic resonance imaging (MRI) only under specific conditions. Before you have an MRI, tell your healthcare provider that  you have an IUD in place, and which type of IUD you have in place. What side effects may I notice from receiving this medicine? Side effects that you should report to your doctor or health care professional as soon as possible: -allergic reactions like skin rash, itching or hives, swelling of the face, lips, or tongue -fever, flu-like symptoms -genital sores -high blood pressure -no menstrual period for 6 weeks during use -pain, swelling, warmth in the leg -pelvic pain or tenderness -severe or sudden headache -signs of pregnancy -stomach cramping -sudden shortness of breath -trouble with balance, talking, or walking -unusual vaginal bleeding, discharge -yellowing of the eyes or skin Side effects that usually do not require medical attention (report to your doctor or health care professional if they continue or are bothersome): -acne -breast pain -change in sex drive or performance -changes in weight -cramping, dizziness, or faintness while the device is being inserted -headache -irregular menstrual bleeding within first 3 to 6 months of use -nausea This list may not describe all possible side effects. Call your doctor for medical advice about side effects. You may report side effects to FDA at 1-800-FDA-1088. Where should I keep my medicine? This does not apply. NOTE: This sheet is a summary. It may not cover all possible information. If you have questions about this medicine, talk to your doctor, pharmacist, or health care provider.  2018 Elsevier/Gold Standard (2015-12-22 14:14:56)   

## 2017-07-03 NOTE — Addendum Note (Signed)
Addended by: Federico FlakeNES, Corisa Montini A on: 07/03/2017 04:22 PM   Modules accepted: Orders

## 2017-07-03 NOTE — Progress Notes (Signed)
   IUD INSERTION Patient name: Julia Johnson MRN [id number]  Date of birth: Feb 21, 1981 Subjective Findings:   Julia Johnson is a 37 y.o. [phone] Caucasian female being seen today for insertion of a Liletta IUD for period management/contraception. Has menorrhagia w/ regular cycle. Has been on POPs w/o improvement in menses. COCs have worsened depression in past, and doesn't want to try depo/nexplanon. Also has heavy rectal bleeding only during periods. Had TCS since last visit and had some diverticulosis and internal hemorrhoids, no obvious reason for sx. Was told could be endometriosis or hormonal cause. Was given rx for anusol cream to use couple of days prior to and during menses to see if it helps w/ rectal bleeding. Unsure if she wants more children in future, wants to think about it a little more.     Patient's last menstrual period was 06/29/2017. Last sexual intercourse was prior to menses Last pap10/25/17. Results were:  normal  The risks and benefits of the method and placement have been thouroughly reviewed with the patient and all questions were answered.  Specifically the patient is aware of failure rate of 03/998, expulsion of the IUD and of possible perforation.  The patient is aware of irregular bleeding due to the method and understands the incidence of irregular bleeding diminishes with time.  Signed copy of informed consent in chart.  Pertinent History Reviewed:   Reviewed past medical,surgical, social, obstetrical and family history.  Reviewed problem list, medications and allergies. Objective Findings & Procedure:   Vitals:   07/03/17 1444  BP: 130/80  Pulse: 97  Weight: 249 lb (112.9 kg)  Body mass index is 39 kg/m.  Results for orders placed or performed in visit on 07/03/17 (from the past 24 hour(s))  POCT urine pregnancy   Collection Time: 07/03/17  2:57 PM  Result Value Ref Range   Preg Test, Ur Negative Negative     Time out was performed.  A graves speculum  was placed in the vagina.  The cervix was visualized, prepped using Betadine, and grasped with a single tooth tenaculum. The uterus was found to be neutral to retroverted and it sounded to 9 cm.  Liletta IUD placed per manufacturer's recommendations. The strings were trimmed to approximately 3 cm. The patient tolerated the procedure well.   Informal transvaginal sonogram was performed and the proper placement of the IUD was verified. Assessment & Plan:   1) Liletta IUD insertion The patient was given post procedure instructions, including signs and symptoms of infection and to check for the strings after each menses or each month, and refraining from intercourse or anything in the vagina for 3 days. She was given a Liletta care card with date IUD placed, and date IUD to be removed. She is scheduled for a f/u appointment in 4 weeks. If sx not improved after 3-176mths or worsening, make appt w/ MD.   Orders Placed This Encounter  Procedures  . POCT urine pregnancy    Return in about 1 month (around 07/31/2017) for F/U.  Cheral MarkerKimberly R Booker CNM, [facility]NP-BC 07/03/2017 4:10 PM

## 2017-07-10 IMAGING — CR DG CHEST 2V
3 series · 3 of 3 positions shown · non-contrast
Comparison: None.

CLINICAL DATA: Short of breath. Right rib cage pain. Nausea and
cough.

EXAM:
CHEST  2 VIEW

[view not recorded (1 of 3)]
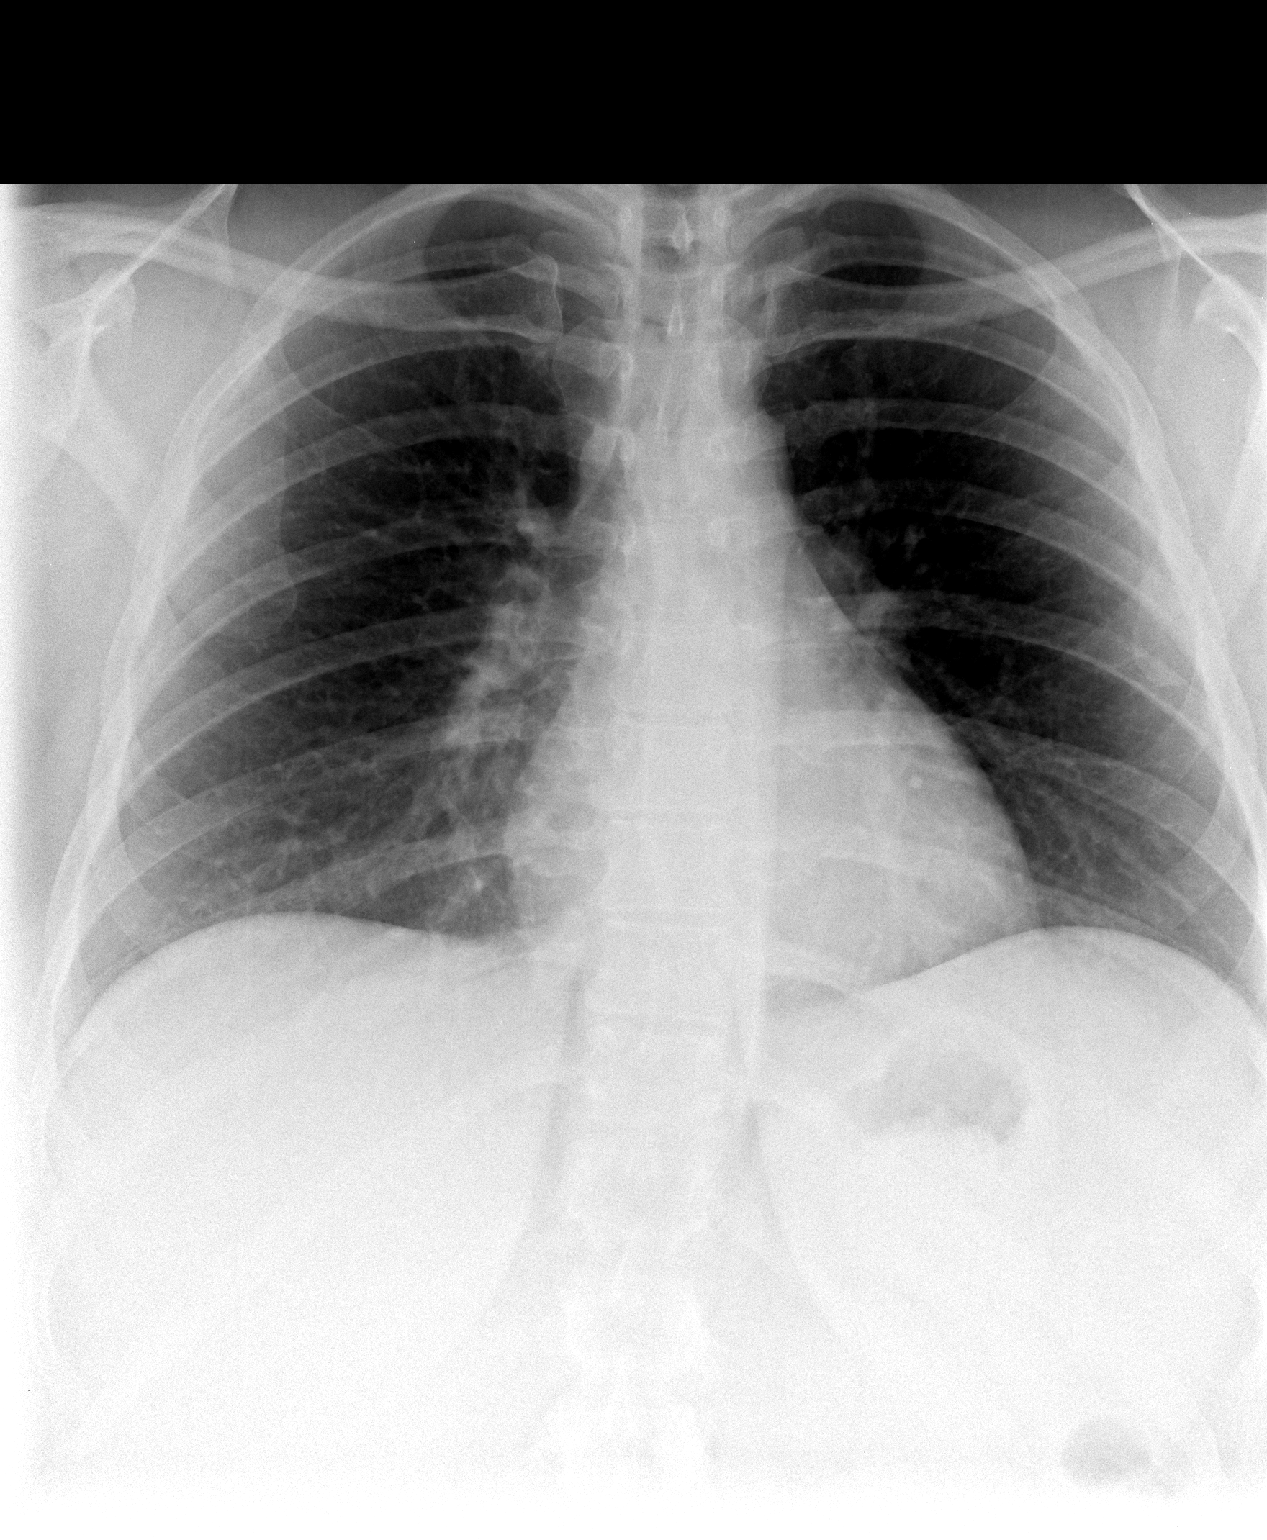

[view not recorded (2 of 3)]
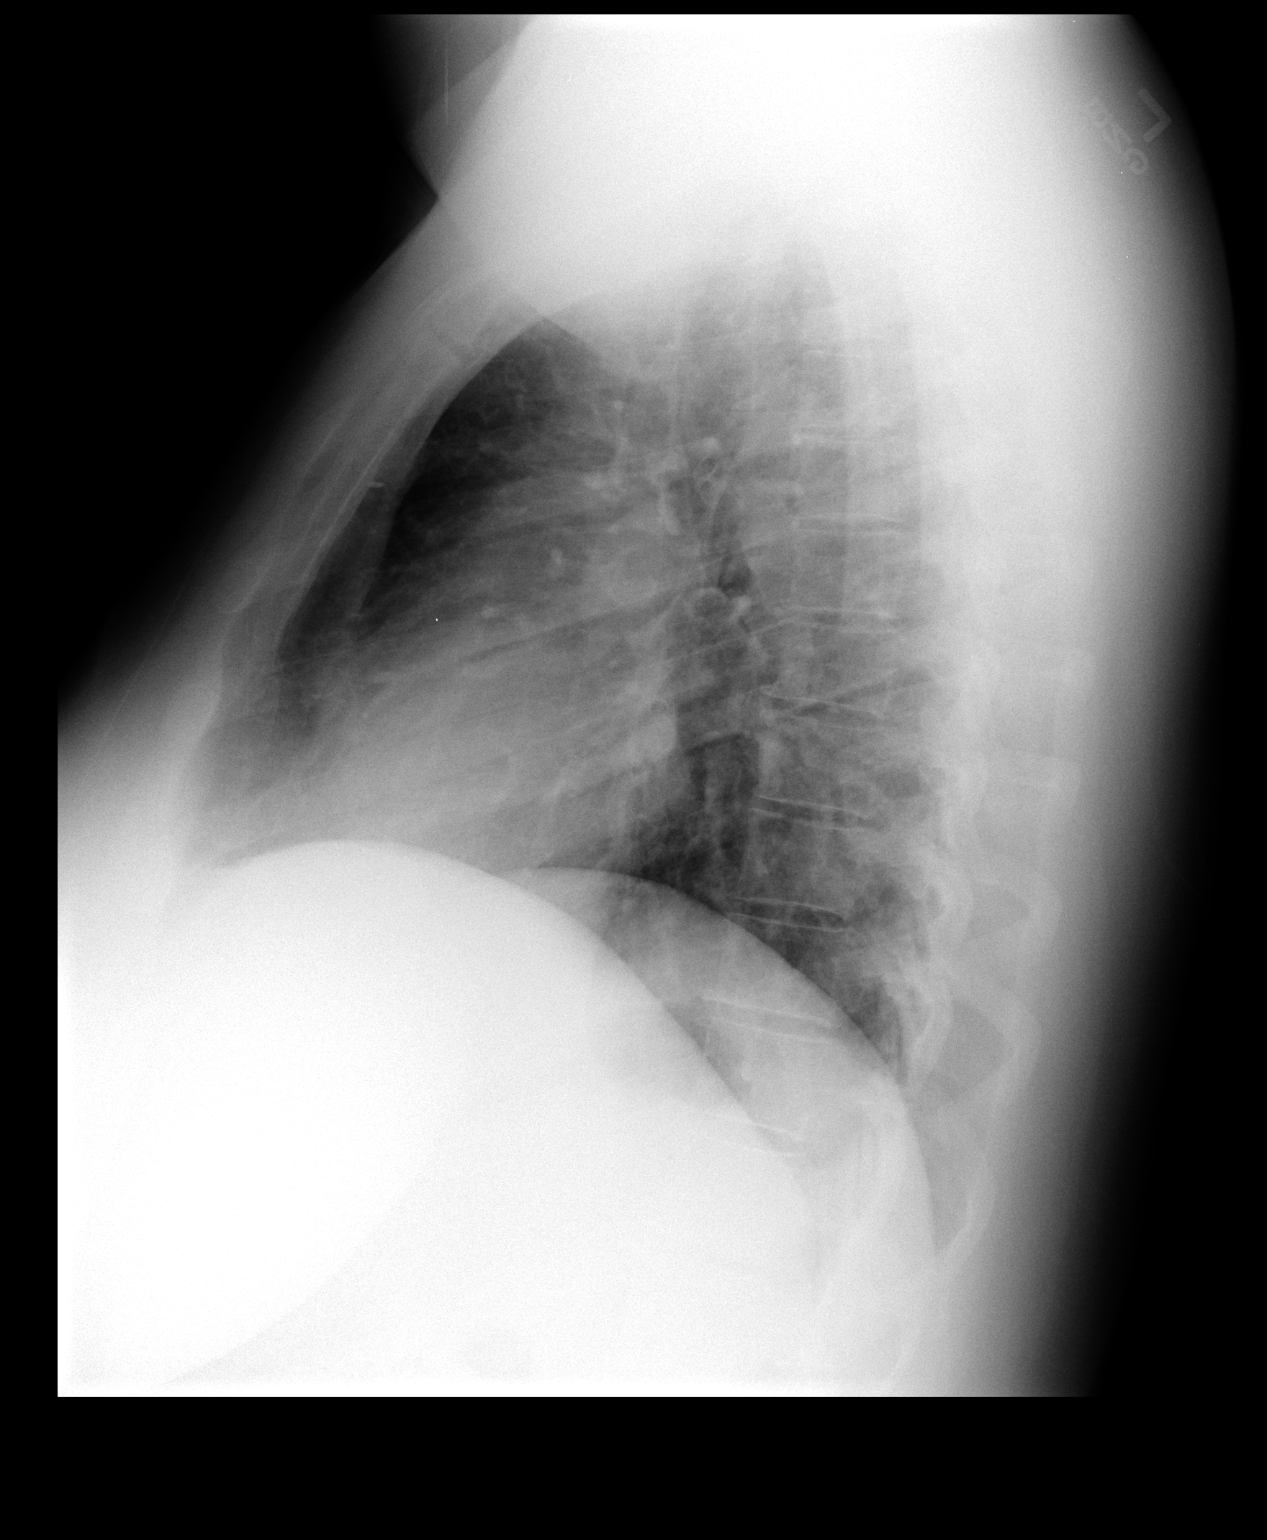

[view not recorded (3 of 3)]
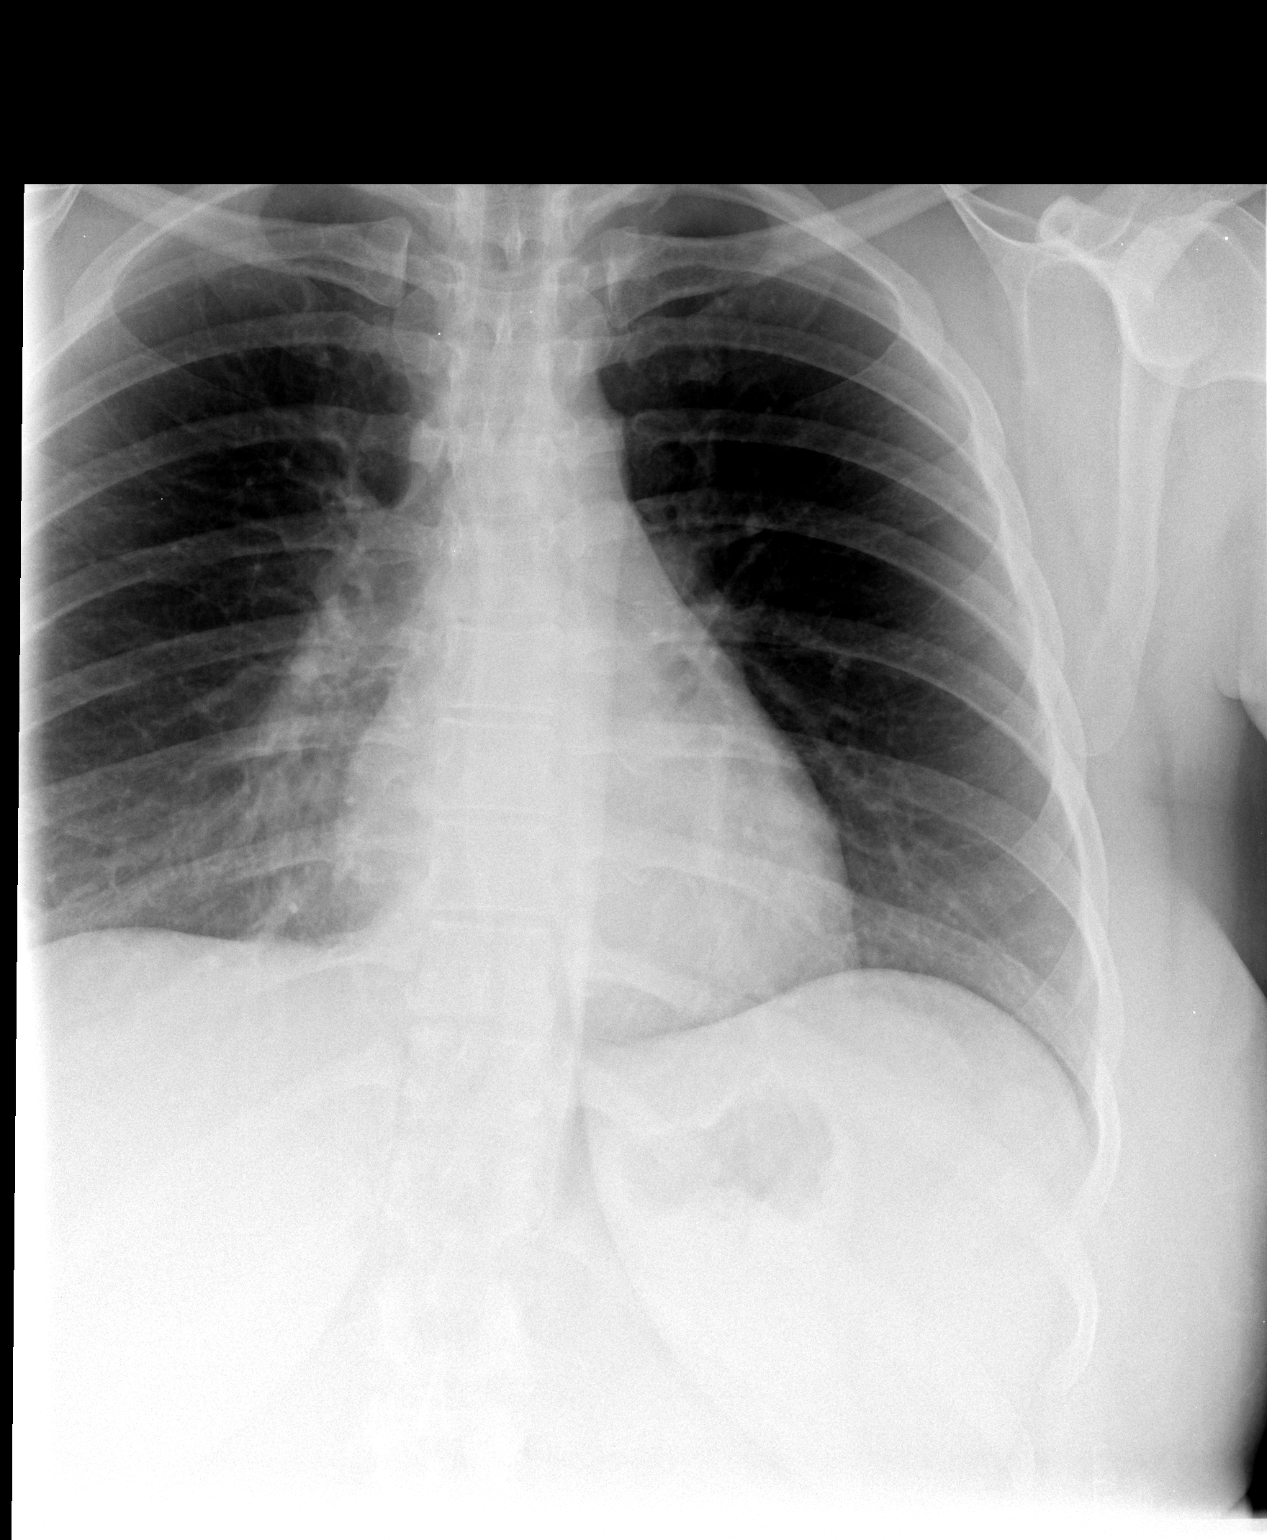

[3 of 3 positions shown; findings below may reference images not displayed]

FINDINGS: The heart size and mediastinal contours are within normal limits.
Both lungs are clear. The visualized skeletal structures are
unremarkable.
IMPRESSION: No active cardiopulmonary disease.

## 2017-08-04 ENCOUNTER — Telehealth: Payer: Self-pay | Admitting: Women's Health

## 2017-08-04 NOTE — Telephone Encounter (Signed)
Patient states she feels "horrible" and wants the IUD out.  She is out of breath, having "brain fog", leg pain, pelvic pain, fatigue, depression and anxiety is worse.  Feeling like she has had gluten and wants IUD out.  Concerned it is triggering her celiac disease.  Has appointment on Friday for IUD check.  Informed patient we did not have any available appointments before then so to keep appointment as scheduled. Also asking about a reproductive specialist  Please advise if different.

## 2017-08-05 ENCOUNTER — Ambulatory Visit: Payer: Medicaid Other | Admitting: Women's Health

## 2017-08-06 ENCOUNTER — Encounter (INDEPENDENT_AMBULATORY_CARE_PROVIDER_SITE_OTHER): Payer: Self-pay

## 2017-08-08 ENCOUNTER — Encounter: Payer: Self-pay | Admitting: Women's Health

## 2017-08-08 ENCOUNTER — Ambulatory Visit (INDEPENDENT_AMBULATORY_CARE_PROVIDER_SITE_OTHER): Payer: Medicaid Other | Admitting: Women's Health

## 2017-08-08 VITALS — BP 120/60 | HR 90 | Ht 67.0 in | Wt 252.0 lb

## 2017-08-08 DIAGNOSIS — Z30432 Encounter for removal of intrauterine contraceptive device: Secondary | ICD-10-CM

## 2017-08-08 NOTE — Progress Notes (Signed)
   IUD REMOVAL  Patient name: Julia Johnson MRN [id number]  Date of birth: 04/04/1980 Subjective Findings:   Julia Johnson is a 37 y.o. [phone] Caucasian female being seen today for removal of a Liletta IUD. Her IUD was placed 07/03/17.  She desires removal because of multiple sx since placement. Reports feeling horrible, fatigue, sob, brain fog, pelvic pain, bilateral leg pain, daily spotting, mood swings. States it is a lot of the same sx of celiac flares, but much worse, and has not changed diet. IUD was placed in attempt to cause amenorrhea for management of heavy periods w/ heavy rectal bleeding only during periods. She had a TCS that only showed diverticulosis and internal hemorrhoids- no obvious cause of rectal bleeding w/ periods. Plans to see a reproductive endocrinologist being [facility] at [facility]. Signed copy of informed consent in chart.   No LMP recorded. Last pap10/25/17. Results were:  normal The planned method of family planning is abstinence for right now Pertinent History Reviewed:   Reviewed past medical,surgical, social, obstetrical and family history.  Reviewed problem list, medications and allergies. Objective Findings & Procedure:    Vitals:   08/08/17 1148  BP: 120/60  Pulse: 90  Weight: 252 lb (114.3 kg)  Height:  (1.702 m)  Body mass index is 39.47 kg/m.  No results found for this or any previous visit (from the past 24 hour(s)).   Time out was performed.  A graves speculum was placed in the vagina.  The cervix was visualized, and the strings were visible. They were grasped and the Liletta IUD was easily removed intact without complications. The patient tolerated the procedure well.  Assessment & Plan:   1) Liletta IUD removal Follow-up prn problems  No orders of the defined types were placed in this encounter.   Follow-up: No follow-ups on file.  Cheral Marker CNM, [facility] 08/08/2017 12:43 PM

## 2017-08-19 ENCOUNTER — Other Ambulatory Visit: Payer: Self-pay | Admitting: Women's Health

## 2017-09-26 ENCOUNTER — Encounter: Payer: Self-pay | Admitting: Women's Health

## 2018-02-26 ENCOUNTER — Other Ambulatory Visit: Payer: Self-pay

## 2018-02-26 ENCOUNTER — Encounter: Payer: Self-pay | Admitting: Physical Therapy

## 2018-02-26 ENCOUNTER — Ambulatory Visit: Payer: BLUE CROSS/BLUE SHIELD | Attending: Neurosurgery | Admitting: Physical Therapy

## 2018-02-26 DIAGNOSIS — M542 Cervicalgia: Secondary | ICD-10-CM | POA: Insufficient documentation

## 2018-02-26 NOTE — Therapy (Signed)
[facility] [address] [place], [place], [address] Phone: [phone]   Fax:  [phone]  Physical Therapy Evaluation  Patient Details  Name: Julia Johnson MRN: [id number] Date of Birth: 1980-12-07 Referring Provider (PT): Claudean Severance, PA-C/ Dr. Alphonzo Cruise   Encounter Date: 02/26/2018  PT End of Session - 02/26/18 1246    Visit Number  1    Number of Visits  12    Date for PT Re-Evaluation  04/16/18    Authorization Type  Progress note every 10th visit    PT Start Time  1115    PT Stop Time  1200    PT Time Calculation (min)  45 min    Activity Tolerance  Patient tolerated treatment well;Patient limited by pain    Behavior During Therapy  [facility] for tasks assessed/performed       Past Medical History:  Diagnosis Date  . ADHD (attention deficit hyperactivity disorder)   . Anxiety   . Carpal tunnel syndrome   . Celiac disease   . Depression   . Diabetes mellitus without complication (HCC)   . Gestational diabetes    glyburide  . Hyperlipidemia   . Obesity   . Pregnancy induced hypertension   . UTI (urinary tract infection)     Past Surgical History:  Procedure Laterality Date  . MOUTH SURGERY    . SKIN BIOPSY  2012    There were no vitals filed for this visit.   Subjective Assessment - 02/26/18 1247    Subjective  Patient arrives to physical therapy with reports of ongoing neck and right UT pain that has progressively gotten worse in the past 6 months. Patient states pain interferes with her ADLs and caregiving activities for her 3 children and husband who is disabled. She has also been experiencing right upper arm pain as well and is unsure if this pain is related to the neck. Patient reports there is an increase of pain with especially before her menstrual cycle. She states her pain primarily occurs on the right side of the neck but when the pain increases, she experiences pain on both sides of her neck and  bilateral UTs. Reports pain at worst is 6/10 and pain at best is 0/10. Patient's goals are to decrease pain, improve mobility and strength to perform home activities and caregiving activities.    Pertinent History  bilateral carpal tunnel    Limitations  House hold activities;Other (comment);Lifting   caregiving activities   Diagnostic tests  see imaging    Patient Stated Goals  decrease pain and improve movement for caregiving roles    Currently in Pain?  Yes    Pain Score  4     Pain Location  Neck    Pain Orientation  Posterior;Right    Pain Descriptors / Indicators  Heaviness;Tightness    Pain Type  Chronic pain    Pain Onset  More than a month ago    Pain Frequency  Intermittent    Aggravating Factors   caregiving activities, lifting    Pain Relieving Factors  heat, essential oils, ibuprofen, tylenol PRN, movement         [facility] PT Assessment - 02/26/18 0001      Assessment   Medical Diagnosis  Neck pain, myofascial muscle pain    Referring Provider (PT)  Claudean Severance, PA-C/ Dr. Alphonzo Cruise    Onset Date/Surgical Date  --   ongoing exacerbated 08/2017   Hand Dominance  Right  Next MD Visit  n/a    Prior Therapy  yes      Precautions   Precautions  None      Restrictions   Weight Bearing Restrictions  No      Balance Screen   Has the patient fallen in the past 6 months  No    Has the patient had a decrease in activity level because of a fear of falling?   No    Is the patient reluctant to leave their home because of a fear of falling?   No      Home Environment   Living Environment  Private residence    Living Arrangements  Spouse/significant other;Children      Prior Function   Level of Independence  Independent      Posture/Postural Control   Posture/Postural Control  Postural limitations    Postural Limitations  Rounded Shoulders;Forward head;Increased thoracic kyphosis      ROM / Strength   AROM / PROM / Strength  AROM      AROM   Overall  AROM   Due to pain    Overall AROM Comments  pain at end range for all motions    AROM Assessment Site  Cervical    Cervical Flexion  41    Cervical Extension  46    Cervical - Right Side Bend  26    Cervical - Left Side Bend  25    Cervical - Right Rotation  62    Cervical - Left Rotation  65   (+) Pain     Palpation   Palpation comment  increased UT muscle tension bilaterally but R>L point tenderness to right deltoid insertion                Objective measurements completed on examination: See above findings.              PT Education - 02/26/18 1251    Education Details  shoulder rolls, chin tucks, scapular retractions.    Person(s) Educated  Patient    Methods  Demonstration;Explanation;Handout    Comprehension  Verbalized understanding;Returned demonstration          PT Long Term Goals - 02/26/18 1317      PT LONG TERM GOAL #1   Title  Patient will be independent with HEP    Time  6    Period  Weeks    Status  New      PT LONG TERM GOAL #2   Title  Patient will report ability to perform ADLS and caregiving activities with neck pain less than 4/10.    Time  6    Period  Weeks    Status  New      PT LONG TERM GOAL #3   Title  Patient will demonstrate 70+ degrees of cervical rotation AROM with pain less than 4/10.    Time  6    Period  Weeks    Status  New             Plan - 02/26/18 1312    Clinical Impression Statement  Patient is a 37 year old female who presents to physical therapy with ongoing neck pain. Patient noted with decreased AROM with pain at end ranges. Patient noted with increased muscle tension in bilateral UTs with increased discomfort with deep palpation, R>L. Patient noted with forward head and rounded shoulders as well as increased thoracic kyphosis in sitting. Patient would benefit from skilled  physical therapy to address deficits and address patient's goals.     Clinical Presentation  Evolving    Clinical Decision  Making  Low    Rehab Potential  Good    PT Frequency  2x / week    PT Duration  6 weeks    PT Treatment/Interventions  ADLs/Self Care Home Management;Iontophoresis 4mg /ml Dexamethasone;Moist Heat;Traction;Ultrasound;Electrical Stimulation;Cryotherapy;Neuromuscular re-education;Passive range of motion;Manual techniques;Dry needling;Therapeutic exercise;Therapeutic activities;Patient/family education    PT Next Visit Plan  gentle postural exercises, cervical AROM and stretching, modalities PRN for pain relief; manual traction before mechanical    PT Home Exercise Plan  see patient education section    Consulted and Agree with Plan of Care  Patient       Patient will benefit from skilled therapeutic intervention in order to improve the following deficits and impairments:  Pain, Postural dysfunction, Impaired UE functional use, Decreased range of motion, Decreased activity tolerance  Visit Diagnosis: Cervicalgia - Plan: PT plan of care cert/re-cert     Problem List Patient Active Problem List   Diagnosis Date Noted  . Rectal bleeding 05/15/2017  . History of gestational diabetes 05/06/2016  . Celiac disease 01/17/2016  . Hypercholesterolemia 01/05/2016  . Menorrhagia with regular cycle 05/29/2015  . Family history of factor V Leiden mutation 05/29/2015  . PMDD (premenstrual dysphoric disorder) 05/29/2015  . History of gestational hypertension 10/11/2013  . Family history of breast cancer in first degree relative 04/27/2013  . Depression with anxiety 02/23/2013   Guss Bunde, PT, DPT 02/26/2018, 1:37 PM  [facility] [address] [place], [place], [address] Phone: [phone]   Fax:  [phone]  Name: Julia Johnson MRN: [id number] Date of Birth: 01-01-1981

## 2018-03-03 ENCOUNTER — Ambulatory Visit: Payer: BLUE CROSS/BLUE SHIELD | Admitting: Physical Therapy

## 2018-03-05 DIAGNOSIS — G5603 Carpal tunnel syndrome, bilateral upper limbs: Secondary | ICD-10-CM | POA: Diagnosis not present

## 2018-03-05 DIAGNOSIS — Z6839 Body mass index (BMI) 39.0-39.9, adult: Secondary | ICD-10-CM | POA: Diagnosis not present

## 2018-03-05 DIAGNOSIS — M79601 Pain in right arm: Secondary | ICD-10-CM | POA: Diagnosis not present

## 2018-03-05 DIAGNOSIS — S161XXA Strain of muscle, fascia and tendon at neck level, initial encounter: Secondary | ICD-10-CM | POA: Diagnosis not present

## 2018-03-10 ENCOUNTER — Encounter: Payer: Self-pay | Admitting: Physical Therapy

## 2018-03-10 ENCOUNTER — Ambulatory Visit: Payer: BLUE CROSS/BLUE SHIELD | Admitting: Physical Therapy

## 2018-03-10 DIAGNOSIS — M542 Cervicalgia: Secondary | ICD-10-CM

## 2018-03-10 NOTE — Therapy (Signed)
[facility] [address] [place], [place], [address] Phone: [phone]   Fax:  [phone]  Physical Therapy Treatment  Patient Details  Name: Julia Johnson MRN: [id number] Date of Birth: [birth year] Referring Provider (PT): Claudean SeveranceJennifer Lyn Gerard, PA-C/ Dr. Alphonzo CruiseJonathan Wilson   Encounter Date: 03/10/2018  PT End of Session - 03/10/18 1118    Visit Number  2    Number of Visits  12    Date for PT Re-Evaluation  04/16/18    Authorization Type  Progress note every 10th visit    PT Start Time  1119    PT Stop Time  1206    PT Time Calculation (min)  47 min    Activity Tolerance  Patient tolerated treatment well    Behavior During Therapy  [facility] for tasks assessed/performed       Past Medical History:  Diagnosis Date  . ADHD (attention deficit hyperactivity disorder)   . Anxiety   . Carpal tunnel syndrome   . Celiac disease   . Depression   . Diabetes mellitus without complication (HCC)   . Gestational diabetes    glyburide  . Hyperlipidemia   . Obesity   . Pregnancy induced hypertension   . UTI (urinary tract infection)     Past Surgical History:  Procedure Laterality Date  . MOUTH SURGERY    . SKIN BIOPSY  2012    There were no vitals filed for this visit.  Subjective Assessment - 03/10/18 1115    Subjective  Reports that she had to cancel last week and was placed on 5 day prednisone which she stopped today. Reports that RUE pain and neck pain has stopped.    Patient is accompained by:  Family member   Husband   Pertinent History  bilateral carpal tunnel    Limitations  House hold activities;Other (comment);Lifting    Diagnostic tests  see imaging    Patient Stated Goals  decrease pain and improve movement for caregiving roles    Currently in Pain?  Yes    Pain Score  2     Pain Location  Neck    Pain Orientation  Right    Pain Descriptors / Indicators  Sore;Aching    Pain Type  Chronic pain    Pain Onset  More than a  month ago         [facility] PT Assessment - 03/10/18 0001      Assessment   Medical Diagnosis  Neck pain, myofascial muscle pain    Referring Provider (PT)  Claudean SeveranceJennifer Lyn Gerard, PA-C/ Dr. Alphonzo CruiseJonathan Wilson    Hand Dominance  Right    Next MD Visit  n/a    Prior Therapy  yes      Precautions   Precautions  None      Restrictions   Weight Bearing Restrictions  No                   [facility] Adult PT Treatment/Exercise - 03/10/18 0001      Exercises   Exercises  Neck;Shoulder      Neck Exercises: Machines for Strengthening   UBE (Upper Arm Bike)  120 RPM x6 min   forward/backward     Shoulder Exercises: Standing   Extension  Strengthening;Both;20 reps;Limitations    Extension Limitations  Pink XTS    Row  Strengthening;Both;20 reps;Limitations    Row Limitations  Pink XTS      Shoulder Exercises: ROM/Strengthening   Wall Pushups  20 reps    "W" Arms  x20 reps    Other ROM/Strengthening Exercises  B snow angels x15 reps      Modalities   Modalities  Electrical Stimulation;Moist Heat      Moist Heat Therapy   Number Minutes Moist Heat  10 Minutes    Moist Heat Location  Cervical      Electrical Stimulation   Electrical Stimulation Location  B UT    Electrical Stimulation Action  Pre-Mod    Electrical Stimulation Parameters  80-150 hz x10 min    Electrical Stimulation Goals  Pain      Manual Therapy   Manual Therapy  Soft tissue mobilization;Manual Traction    Soft tissue mobilization  STW to R UT to reduce pain with L cervical rotation    Manual Traction  Manual traction varied traction to assess response      Neck Exercises: Stretches   Upper Trapezius Stretch  Right;Left;3 reps;30 seconds                  PT Long Term Goals - 03/10/18 1119      PT LONG TERM GOAL #1   Title  Patient will be independent with HEP    Time  6    Period  Weeks    Status  Achieved      PT LONG TERM GOAL #2   Title  Patient will report ability to perform ADLS  and caregiving activities with neck pain less than 4/10.    Time  6    Period  Weeks    Status  On-going      PT LONG TERM GOAL #3   Title  Patient will demonstrate 70+ degrees of cervical rotation AROM with pain less than 4/10.    Time  6    Period  Weeks    Status  On-going            Plan - 03/10/18 1211    Clinical Impression Statement  Patient tolerated today's treatment well with less R neck and shoulder pain. Patient guided through postural strengthening with reports of posterior shoulder discomfort. No increased muscle tightness palpable in R UT today. Patient reported feeling discomfort with R cervical rotation. Good manual traction response noted and patient educated to assess traction response. No limitations with B crevical rotation passively. Normal modalities response noted following removal of the modalities.    Rehab Potential  Good    PT Frequency  2x / week    PT Duration  6 weeks    PT Treatment/Interventions  ADLs/Self Care Home Management;Iontophoresis 4mg /ml Dexamethasone;Moist Heat;Traction;Ultrasound;Electrical Stimulation;Cryotherapy;Neuromuscular re-education;Passive range of motion;Manual techniques;Dry needling;Therapeutic exercise;Therapeutic activities;Patient/family education    PT Next Visit Plan  gentle postural exercises, cervical AROM and stretching, modalities PRN for pain relief; manual traction before mechanical    PT Home Exercise Plan  see patient education section    Consulted and Agree with Plan of Care  Patient;Family member/caregiver    Family Member Consulted  Husband       Patient will benefit from skilled therapeutic intervention in order to improve the following deficits and impairments:  Pain, Postural dysfunction, Impaired UE functional use, Decreased range of motion, Decreased activity tolerance  Visit Diagnosis: Cervicalgia     Problem List Patient Active Problem List   Diagnosis Date Noted  . Rectal bleeding 05/15/2017  .  History of gestational diabetes 05/06/2016  . Celiac disease 01/17/2016  . Hypercholesterolemia 01/05/2016  . Menorrhagia with regular  cycle 05/29/2015  . Family history of factor V Leiden mutation 05/29/2015  . PMDD (premenstrual dysphoric disorder) 05/29/2015  . History of gestational hypertension 10/11/2013  . Family history of breast cancer in first degree relative 04/27/2013  . Depression with anxiety 02/23/2013    Marvell Fuller, PTA 03/10/2018, 12:18 PM  [facility] [address] [place], [place], [address] Phone: [phone]   Fax:  [phone]  Name: Julia Johnson MRN: [id number] Date of Birth: Jul 07, 1980

## 2018-03-12 ENCOUNTER — Encounter: Payer: BLUE CROSS/BLUE SHIELD | Admitting: Physical Therapy

## 2018-03-16 ENCOUNTER — Encounter: Payer: Self-pay | Admitting: Physical Therapy

## 2018-03-16 ENCOUNTER — Ambulatory Visit: Payer: BLUE CROSS/BLUE SHIELD | Admitting: Physical Therapy

## 2018-03-16 DIAGNOSIS — M542 Cervicalgia: Secondary | ICD-10-CM

## 2018-03-16 NOTE — Therapy (Signed)
[facility] [address] [place], [place], [address] Phone: [phone]   Fax:  [phone]  Physical Therapy Treatment  Patient Details  Name: Julia Johnson MRN: [id number] Date of Birth: [birth year] Referring Provider (PT): Claudean SeveranceJennifer Lyn Gerard, PA-C/ Dr. Alphonzo CruiseJonathan Wilson   Encounter Date: 03/16/2018  PT End of Session - 03/16/18 1118    Visit Number  3    Number of Visits  12    Date for PT Re-Evaluation  04/16/18    Authorization Type  Progress note every 10th visit    PT Start Time  1118    PT Stop Time  1205    PT Time Calculation (min)  47 min    Activity Tolerance  Patient tolerated treatment well    Behavior During Therapy  [facility] for tasks assessed/performed       Past Medical History:  Diagnosis Date  . ADHD (attention deficit hyperactivity disorder)   . Anxiety   . Carpal tunnel syndrome   . Celiac disease   . Depression   . Diabetes mellitus without complication (HCC)   . Gestational diabetes    glyburide  . Hyperlipidemia   . Obesity   . Pregnancy induced hypertension   . UTI (urinary tract infection)     Past Surgical History:  Procedure Laterality Date  . MOUTH SURGERY    . SKIN BIOPSY  2012    There were no vitals filed for this visit.  Subjective Assessment - 03/16/18 1117    Subjective  Reports increased pain upon waking Thursday after finishing prednisone but has been taking aleve instead of ibuprofen which has made improvements. Has not had any NSAID this morning.    Pertinent History  bilateral carpal tunnel    Limitations  House hold activities;Other (comment);Lifting    Diagnostic tests  see imaging    Patient Stated Goals  decrease pain and improve movement for caregiving roles    Currently in Pain?  Yes    Pain Score  2     Pain Location  Neck    Pain Orientation  Right    Pain Descriptors / Indicators  Discomfort    Pain Type  Chronic pain    Pain Onset  More than a month ago          [facility] PT Assessment - 03/16/18 0001      Assessment   Medical Diagnosis  Neck pain, myofascial muscle pain    Referring Provider (PT)  Claudean SeveranceJennifer Lyn Gerard, PA-C/ Dr. Alphonzo CruiseJonathan Wilson    Hand Dominance  Right    Next MD Visit  n/a    Prior Therapy  yes      Precautions   Precautions  None      Restrictions   Weight Bearing Restrictions  No                   [facility] Adult PT Treatment/Exercise - 03/16/18 0001      Neck Exercises: Machines for Strengthening   UBE (Upper Arm Bike)  120 RPM x6 min   forward/backward     Shoulder Exercises: Standing   Horizontal ABduction  Strengthening;Both;20 reps;Theraband    Theraband Level (Shoulder Horizontal ABduction)  Level 2 (Red)    Extension  Strengthening;Both;20 reps;Limitations    Extension Limitations  Pink XTS    Row  Strengthening;Both;20 reps;Limitations    Row Limitations  Pink XTS      Modalities   Modalities  Electrical Stimulation;Moist Heat;Traction  Moist Heat Therapy   Number Minutes Moist Heat  10 Minutes    Moist Heat Location  Cervical      Electrical Stimulation   Electrical Stimulation Location  R UT    Electrical Stimulation Action  Pre-Mod    Electrical Stimulation Parameters  80-150 hz x10 min    Electrical Stimulation Goals  Pain      Traction   Type of Traction  Cervical    Min (lbs)  5    Max (lbs)  13    Hold Time  99    Rest Time  5    Time  15                  PT Long Term Goals - 03/10/18 1119      PT LONG TERM GOAL #1   Title  Patient will be independent with HEP    Time  6    Period  Weeks    Status  Achieved      PT LONG TERM GOAL #2   Title  Patient will report ability to perform ADLS and caregiving activities with neck pain less than 4/10.    Time  6    Period  Weeks    Status  On-going      PT LONG TERM GOAL #3   Title  Patient will demonstrate 70+ degrees of cervical rotation AROM with pain less than 4/10.    Time  6    Period  Weeks     Status  On-going            Plan - 03/16/18 1145    Clinical Impression Statement  Patient tolerated today's treatment well with less cervical pain today. Patient guided through postural strengthening with reports of "feeling it" with row and horizontal abduction. Mechanical traction completed today at 15# max today. Normal modalities response noted following removal of the modalities.    Rehab Potential  Good    PT Frequency  2x / week    PT Duration  6 weeks    PT Treatment/Interventions  ADLs/Self Care Home Management;Iontophoresis 4mg /ml Dexamethasone;Moist Heat;Traction;Ultrasound;Electrical Stimulation;Cryotherapy;Neuromuscular re-education;Passive range of motion;Manual techniques;Dry needling;Therapeutic exercise;Therapeutic activities;Patient/family education    PT Next Visit Plan  gentle postural exercises, cervical AROM and stretching, modalities PRN for pain relief; manual traction before mechanical    PT Home Exercise Plan  see patient education section    Consulted and Agree with Plan of Care  Patient       Patient will benefit from skilled therapeutic intervention in order to improve the following deficits and impairments:  Pain, Postural dysfunction, Impaired UE functional use, Decreased range of motion, Decreased activity tolerance  Visit Diagnosis: Cervicalgia     Problem List Patient Active Problem List   Diagnosis Date Noted  . Rectal bleeding 05/15/2017  . History of gestational diabetes 05/06/2016  . Celiac disease 01/17/2016  . Hypercholesterolemia 01/05/2016  . Menorrhagia with regular cycle 05/29/2015  . Family history of factor V Leiden mutation 05/29/2015  . PMDD (premenstrual dysphoric disorder) 05/29/2015  . History of gestational hypertension 10/11/2013  . Family history of breast cancer in first degree relative 04/27/2013  . Depression with anxiety 02/23/2013    Marvell FullerKelsey P Abena Erdman, PTA 03/16/2018, 12:09 PM  [facility] [address] [place], [place], [address] Phone: [phone]   Fax:  [phone]  Name: Julia Doorrin Mcclean MRN: [id number] Date of Birth: [birth year]

## 2018-03-19 ENCOUNTER — Ambulatory Visit: Payer: BLUE CROSS/BLUE SHIELD | Admitting: Physical Therapy

## 2018-03-19 DIAGNOSIS — M542 Cervicalgia: Secondary | ICD-10-CM

## 2018-03-19 NOTE — Therapy (Signed)
[facility] [address] [place], [place], [address] Phone: [phone]   Fax:  [phone]  Physical Therapy Treatment  Patient Details  Name: Julia Johnson MRN: [id number] Date of Birth: 1980/10/29 Referring Provider (PT): Claudean SeveranceJennifer Lyn Gerard, PA-C/ Dr. Alphonzo CruiseJonathan Wilson   Encounter Date: 03/19/2018  PT End of Session - 03/19/18 1519    Visit Number  4    Number of Visits  12    Date for PT Re-Evaluation  04/16/18    Authorization Type  Progress note every 10th visit    PT Start Time  1515    PT Stop Time  1613    PT Time Calculation (min)  58 min    Activity Tolerance  Patient tolerated treatment well    Behavior During Therapy  [facility] for tasks assessed/performed       Past Medical History:  Diagnosis Date  . ADHD (attention deficit hyperactivity disorder)   . Anxiety   . Carpal tunnel syndrome   . Celiac disease   . Depression   . Diabetes mellitus without complication (HCC)   . Gestational diabetes    glyburide  . Hyperlipidemia   . Obesity   . Pregnancy induced hypertension   . UTI (urinary tract infection)     Past Surgical History:  Procedure Laterality Date  . MOUTH SURGERY    . SKIN BIOPSY  2012    There were no vitals filed for this visit.  Subjective Assessment - 03/19/18 1520    Subjective  Reports doing a lot during Christmas Eve and Christmas Day. She states Aleve has been helping.     Pertinent History  bilateral carpal tunnel    Limitations  House hold activities;Other (comment);Lifting    Diagnostic tests  see imaging    Patient Stated Goals  decrease pain and improve movement for caregiving roles    Pain Score  4     Pain Location  Neck    Pain Orientation  Right    Pain Descriptors / Indicators  Discomfort    Pain Onset  More than a month ago    Pain Frequency  Constant         [facility] PT Assessment - 03/19/18 0001      Assessment   Medical Diagnosis  Neck pain, myofascial muscle pain    Referring Provider (PT)  Claudean SeveranceJennifer Lyn Gerard, PA-C/ Dr. Alphonzo CruiseJonathan Wilson    Hand Dominance  Right    Next MD Visit  n/a    Prior Therapy  yes                   [facility] Adult PT Treatment/Exercise - 03/19/18 0001      Neck Exercises: Machines for Strengthening   UBE (Upper Arm Bike)  120 RPM x6 min (3 fwd, 3 bwd)      Shoulder Exercises: Standing   Horizontal ABduction  Strengthening;Both;20 reps;Theraband    Theraband Level (Shoulder Horizontal ABduction)  Level 2 (Red)    External Rotation  Strengthening;20 reps    Theraband Level (Shoulder External Rotation)  Level 2 (Red)    Extension  Strengthening;Both;20 reps;Limitations    Extension Limitations  Pink XTS    Row  Strengthening;Both;20 reps;Limitations    Row Limitations  Pink XTS      Modalities   Modalities  Electrical Stimulation;Moist Heat;Traction      Moist Heat Therapy   Number Minutes Moist Heat  10 Minutes    Moist Heat Location  Cervical  Copylectrical Stimulation   Electrical Stimulation Location  R UT    Electrical Stimulation Action  Pre-mod    Electrical Stimulation Parameters  80-150 hz x15 mins    Electrical Stimulation Goals  Pain      Traction   Type of Traction  Cervical    Min (lbs)  5    Max (lbs)  13    Hold Time  99    Rest Time  5    Time  15                  PT Long Term Goals - 03/10/18 1119      PT LONG TERM GOAL #1   Title  Patient will be independent with HEP    Time  6    Period  Weeks    Status  Achieved      PT LONG TERM GOAL #2   Title  Patient will report ability to perform ADLS and caregiving activities with neck pain less than 4/10.    Time  6    Period  Weeks    Status  On-going      PT LONG TERM GOAL #3   Title  Patient will demonstrate 70+ degrees of cervical rotation AROM with pain less than 4/10.    Time  6    Period  Weeks    Status  On-going            Plan - 03/19/18 1642    Clinical Impression Statement  Patient was able to  tolerate treatment well with added exercises. Patient required intermittent cuing to prevent UT activiation with rows and extensions. Traction was maintained at 13#; progress next visit pending response.     Clinical Presentation  Evolving    Clinical Decision Making  Low    Rehab Potential  Good    PT Frequency  2x / week    PT Duration  6 weeks    PT Treatment/Interventions  ADLs/Self Care Home Management;Iontophoresis 4mg /ml Dexamethasone;Moist Heat;Traction;Ultrasound;Electrical Stimulation;Cryotherapy;Neuromuscular re-education;Passive range of motion;Manual techniques;Dry needling;Therapeutic exercise;Therapeutic activities;Patient/family education    PT Next Visit Plan  gentle postural exercises, cervical AROM and stretching, modalities PRN for pain relief; manual traction before mechanical    PT Home Exercise Plan  see patient education section    Consulted and Agree with Plan of Care  Patient       Patient will benefit from skilled therapeutic intervention in order to improve the following deficits and impairments:  Pain, Postural dysfunction, Impaired UE functional use, Decreased range of motion, Decreased activity tolerance  Visit Diagnosis: Cervicalgia     Problem List Patient Active Problem List   Diagnosis Date Noted  . Rectal bleeding 05/15/2017  . History of gestational diabetes 05/06/2016  . Celiac disease 01/17/2016  . Hypercholesterolemia 01/05/2016  . Menorrhagia with regular cycle 05/29/2015  . Family history of factor V Leiden mutation 05/29/2015  . PMDD (premenstrual dysphoric disorder) 05/29/2015  . History of gestational hypertension 10/11/2013  . Family history of breast cancer in first degree relative 04/27/2013  . Depression with anxiety 02/23/2013   Guss BundeKrystle Halil Rentz, PT, DPT 03/19/2018, 4:45 PM  [facility] [address] [place], [place], [address] Phone: [phone]   Fax:  [phone]  Name: Julia Doorrin  Johnson MRN: [id number] Date of Birth: August 29, 1980

## 2018-03-23 ENCOUNTER — Encounter: Payer: Self-pay | Admitting: Physical Therapy

## 2018-03-23 ENCOUNTER — Ambulatory Visit: Payer: BLUE CROSS/BLUE SHIELD | Admitting: Physical Therapy

## 2018-03-23 DIAGNOSIS — M542 Cervicalgia: Secondary | ICD-10-CM

## 2018-03-23 NOTE — Therapy (Signed)
[facility] [address] [place], [place], [address] Phone: [phone]   Fax:  [phone]  Physical Therapy Treatment  Patient Details  Name: Julia Johnson MRN: [id number] Date of Birth: [birth year] Referring Provider (PT): Claudean SeveranceJennifer Lyn Gerard, PA-C/ Dr. Alphonzo CruiseJonathan Wilson   Encounter Date: 03/23/2018  PT End of Session - 03/23/18 1412    Visit Number  5    Number of Visits  12    Date for PT Re-Evaluation  04/16/18    Authorization Type  Progress note every 10th visit    PT Start Time  1344    PT Stop Time  1431    PT Time Calculation (min)  47 min    Activity Tolerance  Patient tolerated treatment well    Behavior During Therapy  [facility] for tasks assessed/performed       Past Medical History:  Diagnosis Date  . ADHD (attention deficit hyperactivity disorder)   . Anxiety   . Carpal tunnel syndrome   . Celiac disease   . Depression   . Diabetes mellitus without complication (HCC)   . Gestational diabetes    glyburide  . Hyperlipidemia   . Obesity   . Pregnancy induced hypertension   . UTI (urinary tract infection)     Past Surgical History:  Procedure Laterality Date  . MOUTH SURGERY    . SKIN BIOPSY  2012    There were no vitals filed for this visit.  Subjective Assessment - 03/23/18 1355    Subjective  Patient has some sorenss after being tense in a dream and then was putting up christmas stuff    Pertinent History  bilateral carpal tunnel    Limitations  House hold activities;Other (comment);Lifting    Diagnostic tests  see imaging    Patient Stated Goals  decrease pain and improve movement for caregiving roles    Currently in Pain?  Yes    Pain Score  3     Pain Location  Neck    Pain Orientation  Right    Pain Descriptors / Indicators  Discomfort    Pain Type  Chronic pain    Pain Onset  More than a month ago    Pain Frequency  Intermittent    Aggravating Factors   lifting     Pain Relieving Factors  heat                        [facility] Adult PT Treatment/Exercise - 03/23/18 0001      Neck Exercises: Machines for Strengthening   UBE (Upper Arm Bike)  120 RPM x6 min (3 fwd, 3 bwd)      Shoulder Exercises: Standing   Horizontal ABduction  Strengthening;Both;20 reps;Theraband    Theraband Level (Shoulder Horizontal ABduction)  Level 2 (Red)      Moist Heat Therapy   Number Minutes Moist Heat  10 Minutes    Moist Heat Location  Cervical      Electrical Stimulation   Electrical Stimulation Location  R UT/scap    Electrical Stimulation Action  IFC    Electrical Stimulation Parameters  80-150hz  x7310min    Electrical Stimulation Goals  Pain      Traction   Type of Traction  Cervical    Min (lbs)  5    Max (lbs)  14    Hold Time  99    Rest Time  5    Time  15  Manual Therapy   Manual Therapy  Soft tissue mobilization;Myofascial release    Soft tissue mobilization  STW to R UT to reduce pain with L cervical rotation    Myofascial Release  MFR to UT and scap boarder to reduce trigger point                  PT Long Term Goals - 03/10/18 1119      PT LONG TERM GOAL #1   Title  Patient will be independent with HEP    Time  6    Period  Weeks    Status  Achieved      PT LONG TERM GOAL #2   Title  Patient will report ability to perform ADLS and caregiving activities with neck pain less than 4/10.    Time  6    Period  Weeks    Status  On-going      PT LONG TERM GOAL #3   Title  Patient will demonstrate 70+ degrees of cervical rotation AROM with pain less than 4/10.    Time  6    Period  Weeks    Status  On-going            Plan - 03/23/18 1426    Clinical Impression Statement  Patient tolerated treatment well today. Patient reported not needing pain meds today and at 3/10. Patient has tightness in right UT and scpula area today. Patient responded well to traction at 14#. Goals progressing.     Rehab Potential  Good    PT Frequency  2x / week     PT Duration  6 weeks    PT Treatment/Interventions  ADLs/Self Care Home Management;Iontophoresis 4mg /ml Dexamethasone;Moist Heat;Traction;Ultrasound;Electrical Stimulation;Cryotherapy;Neuromuscular re-education;Passive range of motion;Manual techniques;Dry needling;Therapeutic exercise;Therapeutic activities;Patient/family education    PT Next Visit Plan  assess progress then cont gentle postural exercises, cervical AROM and stretching, modalities PRN for pain relief    Consulted and Agree with Plan of Care  Patient       Patient will benefit from skilled therapeutic intervention in order to improve the following deficits and impairments:  Pain, Postural dysfunction, Impaired UE functional use, Decreased range of motion, Decreased activity tolerance  Visit Diagnosis: Cervicalgia     Problem List Patient Active Problem List   Diagnosis Date Noted  . Rectal bleeding 05/15/2017  . History of gestational diabetes 05/06/2016  . Celiac disease 01/17/2016  . Hypercholesterolemia 01/05/2016  . Menorrhagia with regular cycle 05/29/2015  . Family history of factor V Leiden mutation 05/29/2015  . PMDD (premenstrual dysphoric disorder) 05/29/2015  . History of gestational hypertension 10/11/2013  . Family history of breast cancer in first degree relative 04/27/2013  . Depression with anxiety 02/23/2013    Hermelinda DellenDUNFORD, Obdulia Steier P, PTA 03/23/2018, 2:31 PM  [facility] [address] [place], [place], [address] Phone: [phone]   Fax:  [phone]  Name: Julia Doorrin Deshaies MRN: [id number] Date of Birth: 1981-03-18

## 2018-03-26 ENCOUNTER — Encounter: Payer: Self-pay | Admitting: Physical Therapy

## 2018-03-26 ENCOUNTER — Ambulatory Visit: Payer: BLUE CROSS/BLUE SHIELD | Attending: Neurosurgery | Admitting: Physical Therapy

## 2018-03-26 DIAGNOSIS — M542 Cervicalgia: Secondary | ICD-10-CM | POA: Insufficient documentation

## 2018-03-26 NOTE — Therapy (Signed)
[facility] [address] [place], [place], [address] Phone: [phone]   Fax:  [phone]  Physical Therapy Treatment  Patient Details  Name: Julia Johnson MRN: [id number] Date of Birth: 1981/01/14 Referring Provider (PT): Claudean Severance, PA-C/ Dr. Alphonzo Cruise   Encounter Date: 03/26/2018  PT End of Session - 03/26/18 1510    Visit Number  6    Number of Visits  12    Date for PT Re-Evaluation  04/16/18    Authorization Type  Progress note every 10th visit    PT Start Time  1431    PT Stop Time  1523    PT Time Calculation (min)  52 min    Activity Tolerance  Patient tolerated treatment well    Behavior During Therapy  [facility] for tasks assessed/performed       Past Medical History:  Diagnosis Date  . ADHD (attention deficit hyperactivity disorder)   . Anxiety   . Carpal tunnel syndrome   . Celiac disease   . Depression   . Diabetes mellitus without complication (HCC)   . Gestational diabetes    glyburide  . Hyperlipidemia   . Obesity   . Pregnancy induced hypertension   . UTI (urinary tract infection)     Past Surgical History:  Procedure Laterality Date  . MOUTH SURGERY    . SKIN BIOPSY  2012    There were no vitals filed for this visit.  Subjective Assessment - 03/26/18 1434    Subjective  Patient reported doing good after last treatment, some soreness last night for unknown reason    Patient is accompained by:  Family member   husband   Pertinent History  bilateral carpal tunnel    Limitations  House hold activities;Other (comment);Lifting    Diagnostic tests  see imaging    Patient Stated Goals  decrease pain and improve movement for caregiving roles    Currently in Pain?  Yes    Pain Score  6     Pain Location  Neck    Pain Orientation  Right    Pain Descriptors / Indicators  Discomfort    Pain Type  Chronic pain    Pain Onset  More than a month ago    Pain Frequency  Intermittent    Aggravating  Factors   lifting or increased activity    Pain Relieving Factors  rest and heat                       [facility] Adult PT Treatment/Exercise - 03/26/18 0001      Neck Exercises: Machines for Strengthening   UBE (Upper Arm Bike)  120 RPM x6 min (3 fwd, 3 bwd)      Moist Heat Therapy   Number Minutes Moist Heat  10 Minutes    Moist Heat Location  Cervical      Electrical Stimulation   Electrical Stimulation Location  R UT/scap    Electrical Stimulation Action  IFC    Electrical Stimulation Parameters  80-150hz  x80min    Electrical Stimulation Goals  Pain      Traction   Type of Traction  Cervical    Min (lbs)  5    Max (lbs)  14    Hold Time  99    Rest Time  5    Time  15      Manual Therapy   Manual Therapy  Soft tissue mobilization;Myofascial release  Soft tissue mobilization  STW to R UT to reduce pain with L cervical rotation    Myofascial Release  MFR to UT and scap boarder to reduce trigger point                  PT Long Term Goals - 03/10/18 1119      PT LONG TERM GOAL #1   Title  Patient will be independent with HEP    Time  6    Period  Weeks    Status  Achieved      PT LONG TERM GOAL #2   Title  Patient will report ability to perform ADLS and caregiving activities with neck pain less than 4/10.    Time  6    Period  Weeks    Status  On-going      PT LONG TERM GOAL #3   Title  Patient will demonstrate 70+ degrees of cervical rotation AROM with pain less than 4/10.    Time  6    Period  Weeks    Status  On-going            Plan - 03/26/18 1438    Clinical Impression Statement  Patient tolerated treatment fair today due to increased soreness for unknown reason. Patient has pain in right cervical spine today. Patient has some mild tightness in UT today and stiffness reported with movement. Patient current goals ongoing.     Rehab Potential  Good    PT Frequency  2x / week    PT Duration  6 weeks    PT  Treatment/Interventions  ADLs/Self Care Home Management;Iontophoresis 4mg /ml Dexamethasone;Moist Heat;Traction;Ultrasound;Electrical Stimulation;Cryotherapy;Neuromuscular re-education;Passive range of motion;Manual techniques;Dry needling;Therapeutic exercise;Therapeutic activities;Patient/family education    PT Next Visit Plan  assess progress then cont gentle postural exercises, cervical AROM and stretching, modalities PRN for pain relief    Consulted and Agree with Plan of Care  Patient       Patient will benefit from skilled therapeutic intervention in order to improve the following deficits and impairments:  Pain, Postural dysfunction, Impaired UE functional use, Decreased range of motion, Decreased activity tolerance  Visit Diagnosis: Cervicalgia     Problem List Patient Active Problem List   Diagnosis Date Noted  . Rectal bleeding 05/15/2017  . History of gestational diabetes 05/06/2016  . Celiac disease 01/17/2016  . Hypercholesterolemia 01/05/2016  . Menorrhagia with regular cycle 05/29/2015  . Family history of factor V Leiden mutation 05/29/2015  . PMDD (premenstrual dysphoric disorder) 05/29/2015  . History of gestational hypertension 10/11/2013  . Family history of breast cancer in first degree relative 04/27/2013  . Depression with anxiety 02/23/2013    Hermelinda DellenDUNFORD, CHRISTINA P, PTA 03/26/2018, 3:48 PM  [facility] [address] [place], [place], [id number] Phone: [phone]   Fax:  [phone]  Name: Julia Johnson MRN: [id number] Date of Birth: 1980/11/25

## 2018-04-02 ENCOUNTER — Ambulatory Visit: Payer: BLUE CROSS/BLUE SHIELD | Admitting: Physical Therapy

## 2018-04-02 ENCOUNTER — Encounter: Payer: Self-pay | Admitting: Physical Therapy

## 2018-04-02 DIAGNOSIS — M542 Cervicalgia: Secondary | ICD-10-CM

## 2018-04-02 NOTE — Therapy (Addendum)
[facility] [place], [place], [address] Phone: [phone]   Fax:  [phone]  Physical Therapy Treatment PHYSICAL THERAPY DISCHARGE SUMMARY  Visits from Start of Care: 7  Current functional level related to goals / functional outcomes: See below   Remaining deficits: See goals   Education / Equipment: HEP Plan: Patient agrees to discharge.  Patient goals were partially met. Patient is being discharged due to not returning since the last visit.  ?????  Gabriela Eves, PT, DPT   Patient Details  Name: Julia Johnson MRN: [id number] Date of Birth: Jul 31, 1980 Referring Provider (PT): Josie Dixon, PA-C/ Dr. Grayland Ormond   Encounter Date: 04/02/2018  PT End of Session - 04/02/18 1147    Visit Number  7    Number of Visits  12    Date for PT Re-Evaluation  04/16/18    Authorization Type  Progress note every 10th visit    PT Start Time  1115    PT Stop Time  1218    PT Time Calculation (min)  63 min    Activity Tolerance  Patient tolerated treatment well    Behavior During Therapy  [facility] for tasks assessed/performed       Past Medical History:  Diagnosis Date  . ADHD (attention deficit hyperactivity disorder)   . Anxiety   . Carpal tunnel syndrome   . Celiac disease   . Depression   . Diabetes mellitus without complication ([place])   . Gestational diabetes    glyburide  . Hyperlipidemia   . Obesity   . Pregnancy induced hypertension   . UTI (urinary tract infection)     Past Surgical History:  Procedure Laterality Date  . MOUTH SURGERY    . SKIN BIOPSY  2012    There were no vitals filed for this visit.  Subjective Assessment - 04/02/18 1129    Subjective  Patient reported doing after last treatment and had two days relief    Pertinent History  bilateral carpal tunnel    Limitations  House hold activities;Other (comment);Lifting    Diagnostic tests  see imaging    Patient Stated Goals   decrease pain and improve movement for caregiving roles    Currently in Pain?  Yes    Pain Score  6     Pain Location  Neck    Pain Orientation  Right    Pain Descriptors / Indicators  Discomfort    Pain Type  Chronic pain    Pain Onset  More than a month ago    Pain Frequency  Intermittent    Aggravating Factors   lifting or prolong activity    Pain Relieving Factors  rest and heat         [facility] PT Assessment - 04/02/18 0001      AROM   AROM Assessment Site  Cervical    Cervical - Right Rotation  60    Cervical - Left Rotation  70                   [facility] Adult PT Treatment/Exercise - 04/02/18 0001      Neck Exercises: Machines for Strengthening   UBE (Upper Arm Bike)  120 RPM x8 min       Shoulder Exercises: Standing   Horizontal ABduction  Strengthening;Both;20 reps;Theraband;10 reps   with ball for stabilization     Moist Heat Therapy   Number Minutes Moist Heat  10 Minutes    Moist  Heat Location  Cervical      Acupuncturist Location  R UT/scap    Electrical Stimulation Action  IFC    Electrical Stimulation Parameters  80-_0  x72mn    Electrical Stimulation Goals  Pain      Traction   Type of Traction  Cervical    Min (lbs)  5    Max (lbs)  15    Hold Time  99    Rest Time  5    Time  15      Manual Therapy   Manual Therapy  Soft tissue mobilization;Myofascial release    Soft tissue mobilization  STW to R UT to reduce pain with L cervical rotation    Myofascial Release  MFR to UT and right paraspinals to reduce trigger point                  PT Long Term Goals - 04/02/18 1205      PT LONG TERM GOAL #1   Title  Patient will be independent with HEP    Time  6    Period  Weeks    Status  Achieved      PT LONG TERM GOAL #2   Title  Patient will report ability to perform ADLS and caregiving activities with neck pain less than 4/10.    Time  6    Period  Weeks    Status  On-going   up to  6/10 with ADL's 04/02/18     PT LONG TERM GOAL #3   Title  Patient will demonstrate 70+ degrees of cervical rotation AROM with pain less than 4/10.    Time  6    Period  Weeks    Status  Partially Met   60 degrees RT/ 70 degrees LT 04/02/18           Plan - 04/02/18 1208    Clinical Impression Statement  Patient tolerated treatment well today. Patient has reported improvement from therapy thus far and has had up to two days relief. Patient reported pain returns with and prolong activity or ADL's. Patient has responded well to traction and has improved with AROM in cervical rotation. Goals progressing.     Rehab Potential  Good    PT Frequency  2x / week    PT Duration  6 weeks    PT Treatment/Interventions  ADLs/Self Care Home Management;Iontophoresis 421mml Dexamethasone;Moist Heat;Traction;Ultrasound;Electrical Stimulation;Cryotherapy;Neuromuscular re-education;Passive range of motion;Manual techniques;Dry needling;Therapeutic exercise;Therapeutic activities;Patient/family education    PT Next Visit Plan  cont gentle postural exercises, cervical AROM and stretching, modalities PRN for pain relief    Consulted and Agree with Plan of Care  Patient       Patient will benefit from skilled therapeutic intervention in order to improve the following deficits and impairments:  Pain, Postural dysfunction, Impaired UE functional use, Decreased range of motion, Decreased activity tolerance  Visit Diagnosis: Cervicalgia     Problem List Patient Active Problem List   Diagnosis Date Noted  . Rectal bleeding 05/15/2017  . History of gestational diabetes 05/06/2016  . Celiac disease 01/17/2016  . Hypercholesterolemia 01/05/2016  . Menorrhagia with regular cycle 05/29/2015  . Family history of factor V Leiden mutation 05/29/2015  . PMDD (premenstrual dysphoric disorder) 05/29/2015  . History of gestational hypertension 10/11/2013  . Family history of breast cancer in first degree relative  04/27/2013  . Depression with anxiety 02/23/2013    Vineeth Fell P, PTA 04/02/2018, 12:22  PM  [facility] [place], [place], [address] Phone: [phone]   Fax:  [phone]  Name: Julia Johnson MRN: [id number] Date of Birth: [birth year]

## 2018-04-07 ENCOUNTER — Ambulatory Visit: Payer: BLUE CROSS/BLUE SHIELD | Admitting: Physical Therapy

## 2018-04-08 ENCOUNTER — Other Ambulatory Visit: Payer: Self-pay | Admitting: Women's Health

## 2018-04-13 ENCOUNTER — Ambulatory Visit: Payer: BLUE CROSS/BLUE SHIELD | Admitting: Physical Therapy

## 2018-05-27 DIAGNOSIS — Z6839 Body mass index (BMI) 39.0-39.9, adult: Secondary | ICD-10-CM | POA: Diagnosis not present

## 2018-05-27 DIAGNOSIS — N390 Urinary tract infection, site not specified: Secondary | ICD-10-CM | POA: Diagnosis not present

## 2018-06-01 DIAGNOSIS — R5383 Other fatigue: Secondary | ICD-10-CM | POA: Diagnosis not present

## 2018-06-01 DIAGNOSIS — K900 Celiac disease: Secondary | ICD-10-CM | POA: Diagnosis not present

## 2018-06-01 DIAGNOSIS — R1084 Generalized abdominal pain: Secondary | ICD-10-CM | POA: Diagnosis not present

## 2018-06-01 DIAGNOSIS — Z6839 Body mass index (BMI) 39.0-39.9, adult: Secondary | ICD-10-CM | POA: Diagnosis not present

## 2018-06-01 DIAGNOSIS — R350 Frequency of micturition: Secondary | ICD-10-CM | POA: Diagnosis not present

## 2018-06-04 DIAGNOSIS — K760 Fatty (change of) liver, not elsewhere classified: Secondary | ICD-10-CM | POA: Diagnosis not present

## 2018-06-04 DIAGNOSIS — R932 Abnormal findings on diagnostic imaging of liver and biliary tract: Secondary | ICD-10-CM | POA: Diagnosis not present

## 2018-06-04 DIAGNOSIS — R350 Frequency of micturition: Secondary | ICD-10-CM | POA: Diagnosis not present

## 2019-02-15 DIAGNOSIS — M25551 Pain in right hip: Secondary | ICD-10-CM | POA: Diagnosis not present

## 2019-02-15 DIAGNOSIS — M25552 Pain in left hip: Secondary | ICD-10-CM | POA: Diagnosis not present

## 2019-02-15 DIAGNOSIS — M7061 Trochanteric bursitis, right hip: Secondary | ICD-10-CM | POA: Diagnosis not present

## 2019-02-15 DIAGNOSIS — M7062 Trochanteric bursitis, left hip: Secondary | ICD-10-CM | POA: Diagnosis not present

## 2019-02-26 DIAGNOSIS — M25551 Pain in right hip: Secondary | ICD-10-CM | POA: Diagnosis not present

## 2019-02-26 DIAGNOSIS — M25451 Effusion, right hip: Secondary | ICD-10-CM | POA: Diagnosis not present

## 2019-04-07 DIAGNOSIS — Z833 Family history of diabetes mellitus: Secondary | ICD-10-CM | POA: Diagnosis not present

## 2019-04-07 DIAGNOSIS — R009 Unspecified abnormalities of heart beat: Secondary | ICD-10-CM | POA: Diagnosis not present

## 2019-04-07 DIAGNOSIS — Z8349 Family history of other endocrine, nutritional and metabolic diseases: Secondary | ICD-10-CM | POA: Diagnosis not present

## 2019-04-07 DIAGNOSIS — E782 Mixed hyperlipidemia: Secondary | ICD-10-CM | POA: Diagnosis not present

## 2019-04-07 DIAGNOSIS — Z0000 Encounter for general adult medical examination without abnormal findings: Secondary | ICD-10-CM | POA: Diagnosis not present

## 2019-04-07 DIAGNOSIS — Z803 Family history of malignant neoplasm of breast: Secondary | ICD-10-CM | POA: Diagnosis not present

## 2019-04-07 DIAGNOSIS — I10 Essential (primary) hypertension: Secondary | ICD-10-CM | POA: Diagnosis not present

## 2019-04-07 DIAGNOSIS — K900 Celiac disease: Secondary | ICD-10-CM | POA: Diagnosis not present

## 2019-04-14 DIAGNOSIS — R0789 Other chest pain: Secondary | ICD-10-CM | POA: Diagnosis not present

## 2019-05-12 DIAGNOSIS — Z803 Family history of malignant neoplasm of breast: Secondary | ICD-10-CM | POA: Diagnosis not present

## 2019-05-12 DIAGNOSIS — R5382 Chronic fatigue, unspecified: Secondary | ICD-10-CM | POA: Diagnosis not present

## 2019-05-12 DIAGNOSIS — M25559 Pain in unspecified hip: Secondary | ICD-10-CM | POA: Diagnosis not present

## 2019-05-12 DIAGNOSIS — R002 Palpitations: Secondary | ICD-10-CM | POA: Diagnosis not present

## 2019-05-12 DIAGNOSIS — M25551 Pain in right hip: Secondary | ICD-10-CM | POA: Diagnosis not present

## 2019-05-12 DIAGNOSIS — K900 Celiac disease: Secondary | ICD-10-CM | POA: Diagnosis not present

## 2019-05-12 DIAGNOSIS — M25552 Pain in left hip: Secondary | ICD-10-CM | POA: Diagnosis not present

## 2019-05-12 DIAGNOSIS — Z1231 Encounter for screening mammogram for malignant neoplasm of breast: Secondary | ICD-10-CM | POA: Diagnosis not present

## 2019-05-21 DIAGNOSIS — M25552 Pain in left hip: Secondary | ICD-10-CM | POA: Diagnosis not present

## 2019-05-21 DIAGNOSIS — M25551 Pain in right hip: Secondary | ICD-10-CM | POA: Diagnosis not present

## 2019-05-28 DIAGNOSIS — K900 Celiac disease: Secondary | ICD-10-CM | POA: Diagnosis not present

## 2019-05-28 DIAGNOSIS — E782 Mixed hyperlipidemia: Secondary | ICD-10-CM | POA: Diagnosis not present

## 2019-05-28 DIAGNOSIS — Z8349 Family history of other endocrine, nutritional and metabolic diseases: Secondary | ICD-10-CM | POA: Diagnosis not present

## 2019-05-28 DIAGNOSIS — M2550 Pain in unspecified joint: Secondary | ICD-10-CM | POA: Diagnosis not present

## 2019-06-09 DIAGNOSIS — M7918 Myalgia, other site: Secondary | ICD-10-CM | POA: Diagnosis not present

## 2019-06-09 DIAGNOSIS — M25552 Pain in left hip: Secondary | ICD-10-CM | POA: Diagnosis not present

## 2019-06-09 DIAGNOSIS — M25551 Pain in right hip: Secondary | ICD-10-CM | POA: Diagnosis not present

## 2019-06-16 DIAGNOSIS — R002 Palpitations: Secondary | ICD-10-CM | POA: Diagnosis not present

## 2019-06-16 DIAGNOSIS — M7918 Myalgia, other site: Secondary | ICD-10-CM | POA: Diagnosis not present

## 2019-06-16 DIAGNOSIS — M25551 Pain in right hip: Secondary | ICD-10-CM | POA: Diagnosis not present

## 2019-06-16 DIAGNOSIS — M25552 Pain in left hip: Secondary | ICD-10-CM | POA: Diagnosis not present

## 2019-07-08 DIAGNOSIS — R002 Palpitations: Secondary | ICD-10-CM | POA: Diagnosis not present

## 2019-07-23 DIAGNOSIS — K900 Celiac disease: Secondary | ICD-10-CM | POA: Diagnosis not present

## 2019-07-23 DIAGNOSIS — N920 Excessive and frequent menstruation with regular cycle: Secondary | ICD-10-CM | POA: Diagnosis not present

## 2019-07-23 DIAGNOSIS — R5382 Chronic fatigue, unspecified: Secondary | ICD-10-CM | POA: Diagnosis not present

## 2019-07-26 DIAGNOSIS — E7800 Pure hypercholesterolemia, unspecified: Secondary | ICD-10-CM | POA: Diagnosis not present

## 2019-07-26 DIAGNOSIS — M2550 Pain in unspecified joint: Secondary | ICD-10-CM | POA: Diagnosis not present

## 2019-07-26 DIAGNOSIS — R768 Other specified abnormal immunological findings in serum: Secondary | ICD-10-CM | POA: Diagnosis not present

## 2019-07-26 DIAGNOSIS — R5382 Chronic fatigue, unspecified: Secondary | ICD-10-CM | POA: Diagnosis not present

## 2019-07-26 DIAGNOSIS — K900 Celiac disease: Secondary | ICD-10-CM | POA: Diagnosis not present

## 2019-08-04 DIAGNOSIS — M2550 Pain in unspecified joint: Secondary | ICD-10-CM | POA: Diagnosis not present

## 2019-08-04 DIAGNOSIS — K900 Celiac disease: Secondary | ICD-10-CM | POA: Diagnosis not present

## 2019-08-04 DIAGNOSIS — M659 Synovitis and tenosynovitis, unspecified: Secondary | ICD-10-CM | POA: Diagnosis not present

## 2019-08-04 DIAGNOSIS — R768 Other specified abnormal immunological findings in serum: Secondary | ICD-10-CM | POA: Diagnosis not present

## 2019-08-04 DIAGNOSIS — R5382 Chronic fatigue, unspecified: Secondary | ICD-10-CM | POA: Diagnosis not present

## 2019-08-11 DIAGNOSIS — E782 Mixed hyperlipidemia: Secondary | ICD-10-CM | POA: Diagnosis not present

## 2019-08-11 DIAGNOSIS — R002 Palpitations: Secondary | ICD-10-CM | POA: Diagnosis not present

## 2019-08-20 DIAGNOSIS — Z01419 Encounter for gynecological examination (general) (routine) without abnormal findings: Secondary | ICD-10-CM | POA: Diagnosis not present

## 2019-08-20 DIAGNOSIS — N920 Excessive and frequent menstruation with regular cycle: Secondary | ICD-10-CM | POA: Diagnosis not present

## 2020-04-17 DIAGNOSIS — U071 COVID-19: Secondary | ICD-10-CM | POA: Diagnosis not present

## 2020-04-17 DIAGNOSIS — L989 Disorder of the skin and subcutaneous tissue, unspecified: Secondary | ICD-10-CM | POA: Diagnosis not present

## 2020-05-22 DIAGNOSIS — K900 Celiac disease: Secondary | ICD-10-CM | POA: Diagnosis not present

## 2020-09-15 DIAGNOSIS — Z01419 Encounter for gynecological examination (general) (routine) without abnormal findings: Secondary | ICD-10-CM | POA: Diagnosis not present

## 2020-09-15 DIAGNOSIS — N939 Abnormal uterine and vaginal bleeding, unspecified: Secondary | ICD-10-CM | POA: Diagnosis not present

## 2020-09-19 DIAGNOSIS — K900 Celiac disease: Secondary | ICD-10-CM | POA: Diagnosis not present

## 2020-09-19 DIAGNOSIS — L659 Nonscarring hair loss, unspecified: Secondary | ICD-10-CM | POA: Diagnosis not present

## 2020-09-19 DIAGNOSIS — Z841 Family history of disorders of kidney and ureter: Secondary | ICD-10-CM | POA: Diagnosis not present

## 2020-09-19 DIAGNOSIS — Z8616 Personal history of COVID-19: Secondary | ICD-10-CM | POA: Diagnosis not present

## 2020-09-19 DIAGNOSIS — E782 Mixed hyperlipidemia: Secondary | ICD-10-CM | POA: Diagnosis not present

## 2020-09-19 DIAGNOSIS — Z0000 Encounter for general adult medical examination without abnormal findings: Secondary | ICD-10-CM | POA: Diagnosis not present

## 2021-12-27 ENCOUNTER — Inpatient Hospital Stay (HOSPITAL_COMMUNITY)
Admission: AD | Admit: 2021-12-27 | Discharge: 2021-12-27 | Disposition: A | Discharge status: Home / Self Care | Payer: Medicaid Other | Attending: Obstetrics and Gynecology | Admitting: Obstetrics and Gynecology

## 2021-12-27 ENCOUNTER — Encounter (HOSPITAL_COMMUNITY): Payer: Self-pay | Admitting: *Deleted

## 2021-12-27 DIAGNOSIS — R102 Pelvic and perineal pain: Secondary | ICD-10-CM | POA: Diagnosis present

## 2021-12-27 DIAGNOSIS — Z3491 Encounter for supervision of normal pregnancy, unspecified, first trimester: Secondary | ICD-10-CM | POA: Diagnosis not present

## 2021-12-27 DIAGNOSIS — O09291 Supervision of pregnancy with other poor reproductive or obstetric history, first trimester: Secondary | ICD-10-CM | POA: Diagnosis not present

## 2021-12-27 DIAGNOSIS — O26891 Other specified pregnancy related conditions, first trimester: Secondary | ICD-10-CM | POA: Diagnosis not present

## 2021-12-27 DIAGNOSIS — K900 Celiac disease: Secondary | ICD-10-CM | POA: Diagnosis not present

## 2021-12-27 DIAGNOSIS — O209 Hemorrhage in early pregnancy, unspecified: Secondary | ICD-10-CM | POA: Diagnosis not present

## 2021-12-27 DIAGNOSIS — Z3A01 Less than 8 weeks gestation of pregnancy: Secondary | ICD-10-CM | POA: Diagnosis not present

## 2021-12-27 DIAGNOSIS — O09521 Supervision of elderly multigravida, first trimester: Secondary | ICD-10-CM | POA: Insufficient documentation

## 2021-12-27 DIAGNOSIS — R109 Unspecified abdominal pain: Secondary | ICD-10-CM

## 2021-12-27 LAB — URINALYSIS, ROUTINE W REFLEX MICROSCOPIC
Bilirubin Urine: NEGATIVE
Glucose, UA: NEGATIVE mg/dL
Hgb urine dipstick: NEGATIVE
Ketones, ur: NEGATIVE mg/dL
Leukocytes,Ua: NEGATIVE
Nitrite: NEGATIVE
Protein, ur: NEGATIVE mg/dL
Specific Gravity, Urine: 1.002 — ABNORMAL LOW (ref 1.005–1.030)
pH: 6 (ref 5.0–8.0)

## 2021-12-27 LAB — POCT PREGNANCY, URINE: Preg Test, Ur: POSITIVE — AB

## 2021-12-27 NOTE — MAU Provider Note (Signed)
History     [id number]  Arrival date and time: 12/27/21 1209    Chief Complaint  Patient presents with   Vaginal Bleeding   Abdominal Pain     HPI Julia Johnson is a 41 y.o. at [redacted]w[redacted]d by LMP with PMHx notable for three prior NSVD, celiac disease, gHTN, who presents for vaginal bleeding and pelvic pain.   Patient reports that she had some light spotting that started last night No heavy bleeding Has also had primarily LLQ pelvic pain Pregnancy was unplanned but welcome She has a new OB appt scheduled for next month with Dr. Nehemiah Settle     OB History     Gravida  5   Para  3   Term  3   Preterm      AB  1   Living  3      SAB  1   IAB      Ectopic      Multiple      Live Births  3           Past Medical History:  Diagnosis Date   ADHD (attention deficit hyperactivity disorder)    Anxiety    Carpal tunnel syndrome    Celiac disease    Depression    Diabetes mellitus without complication ([place])    Gestational diabetes    glyburide   Hyperlipidemia    Obesity    Pregnancy induced hypertension    UTI (urinary tract infection)     Past Surgical History:  Procedure Laterality Date   MOUTH SURGERY     SKIN BIOPSY  2012    Family History  Problem Relation Age of Onset   Cancer Mother        breast   Diabetes Mother    Factor V Leiden deficiency Mother    Heart failure Mother    Heart disease Mother    Hypertension Father    Diabetes Father    Aneurysm Father    Kidney disease Father        on dialysis   Diabetes Maternal Grandmother    Congestive Heart Failure Maternal Grandmother    Celiac disease Daughter    Diabetes Paternal Grandfather    Congestive Heart Failure Paternal Grandfather    Other Daughter        Celiac gene    Social History   Socioeconomic History   Marital status: Married    Spouse name: Not on file   Number of children: Not on file   Years of education: Not on file   Highest education level: Not on file   Occupational History   Not on file  Tobacco Use   Smoking status: Never   Smokeless tobacco: Never  Substance and Sexual Activity   Alcohol use: No   Drug use: No   Sexual activity: Not Currently    Birth control/protection: None  Other Topics Concern   Not on file  Social History Narrative   Not on file   Social Determinants of Health   Financial Resource Strain: Not on file  Food Insecurity: Not on file  Transportation Needs: Not on file  Physical Activity: Not on file  Stress: Not on file  Social Connections: Not on file  Intimate Partner Violence: Not on file    Allergies  Allergen Reactions   Gluten Meal     Celiac disease    No current facility-administered medications on file prior to encounter.   Current Outpatient Medications on  File Prior to Encounter  Medication Sig Dispense Refill   cholecalciferol (VITAMIN D3) 25 MCG (1000 UNIT) tablet Take 1,000 Units by mouth daily.     Prenatal Vit-Fe Fumarate-FA (PNV PRENATAL PLUS MULTIVITAMIN) 27-1 MG TABS Take 1 tablet by mouth once. 30 tablet 11   Acetaminophen (TYLENOL PO) Take by mouth.     hydrocortisone (ANUSOL-HC) 25 MG suppository Place 25 mg rectally 2 (two) times daily.     IBUPROFEN PO Take by mouth. (Patient not taking: Reported on 12/27/2021)     Methocarbamol (ROBAXIN PO) Take by mouth.     norethindrone (MICRONOR,CAMILA,ERRIN) 0.35 MG tablet take ONE tab BY MOUTH DAILY (Patient not taking: Reported on 12/27/2021) 84 tablet 3   UNABLE TO FIND Essential oils       ROS Pertinent positives and negative per HPI, all others reviewed and negative  Physical Exam   BP 135/82   Pulse (!) 115   Temp 98 F (36.7 C)   Resp 18   Ht 5\' 7"  (1.702 m)   Wt 115.7 kg   LMP 11/10/2021   BMI 39.95 kg/m   Patient Vitals for the past 24 hrs:  BP Temp Pulse Resp Height Weight  12/27/21 1233 135/82 98 F (36.7 C) (!) 115 18 5\' 7"  (1.702 m) 115.7 kg    Physical Exam Vitals reviewed.  Constitutional:       General: She is not in acute distress.    Appearance: She is well-developed. She is not diaphoretic.  Eyes:     General: No scleral icterus. Pulmonary:     Effort: Pulmonary effort is normal. No respiratory distress.  Abdominal:     General: There is no distension.     Palpations: Abdomen is soft.     Tenderness: There is no abdominal tenderness. There is no guarding or rebound.  Skin:    General: Skin is warm and dry.  Neurological:     Mental Status: She is alert.     Coordination: Coordination normal.      Cervical Exam    Bedside Ultrasound Pt informed that the ultrasound is considered a limited OB ultrasound and is not intended to be a complete ultrasound exam.  Patient also informed that the ultrasound is not being completed with the intent of assessing for fetal or placental anomalies or any pelvic abnormalities.  Explained that the purpose of today's ultrasound is to assess for  viability.  Patient acknowledges the purpose of the exam and the limitations of the study.    My interpretation: viable IUP seen with yolk sac and fetal pole identified, subjectively  low FHR appreciated   Labs Results for orders placed or performed during the hospital encounter of 12/27/21 (from the past 24 hour(s))  Pregnancy, urine POC     Status: Abnormal   Collection Time: 12/27/21 12:27 PM  Result Value Ref Range   Preg Test, Ur POSITIVE (A) NEGATIVE  Urinalysis, Routine w reflex microscopic Urine, Clean Catch     Status: Abnormal   Collection Time: 12/27/21  1:15 PM  Result Value Ref Range   Color, Urine STRAW (A) YELLOW   APPearance CLEAR CLEAR   Specific Gravity, Urine 1.002 (L) 1.005 - 1.030   pH 6.0 5.0 - 8.0   Glucose, UA NEGATIVE NEGATIVE mg/dL   Hgb urine dipstick NEGATIVE NEGATIVE   Bilirubin Urine NEGATIVE NEGATIVE   Ketones, ur NEGATIVE NEGATIVE mg/dL   Protein, ur NEGATIVE NEGATIVE mg/dL   Nitrite NEGATIVE NEGATIVE   Leukocytes,Ua NEGATIVE  NEGATIVE    Imaging No  results found.  MAU Course  Procedures Lab Orders         Urinalysis, Routine w reflex microscopic         Pregnancy, urine POC    No orders of the defined types were placed in this encounter.  Imaging Orders  No imaging studies ordered today    MDM moderate  Assessment and Plan  #Viable IUP #Abdominal pain in pregnancy, first trimester #Vaginal bleeding in pregnancy, first trimester #[redacted] weeks gestation of pregnancy US shows viable IUP with cardiac activity. We discussed that vaginal bleeding in the first trimester is common, and that 80-90% of patients will go on to have a normal pregnancy with a live delivery. The remainder are at increased risk for miscarriage, unfortunately there are no known interventions to mitigate this risk. Blood type B+ , rhogam not indicated. We discussed return precautions including crescendo abdominal pain, heavy vaginal bleeding soaking >1 pad/hour, and fever.   Dispo: discharged to home in stable condition.    Venora Maples, MD/MPH 12/27/21 2:00 PM  Allergies as of 12/27/2021       Reactions   Gluten Meal    Celiac disease        Medication List     STOP taking these medications    IBUPROFEN PO   norethindrone 0.35 MG tablet Commonly known as: MICRONOR   ROBAXIN PO       TAKE these medications    cholecalciferol 25 MCG (1000 UNIT) tablet Commonly known as: VITAMIN D3 Take 1,000 Units by mouth daily.   hydrocortisone 25 MG suppository Commonly known as: ANUSOL-HC Place 25 mg rectally 2 (two) times daily.   PNV Prenatal Plus Multivitamin 27-1 MG Tabs Take 1 tablet by mouth once.   TYLENOL PO Take by mouth.   UNABLE TO FIND Essential oils

## 2021-12-27 NOTE — MAU Note (Signed)
.  Julia Johnson is a 41 y.o. at Unknown here in MAU reporting: last night she had some bright red bleeding when wiping. C/o  left"ovarian pain" (like when she ovulates.) No bleeding today. LMP: 11/10/2021 Onset of complaint: last night Pain score: 2 Vitals:   12/27/21 1233  BP: 135/82  Pulse: (!) 115  Resp: 18  Temp: 98 F (36.7 C)     FHT:n/a  Lab orders placed from triage:  u/a upt

## 2022-01-21 ENCOUNTER — Inpatient Hospital Stay (HOSPITAL_COMMUNITY): Payer: Medicaid Other

## 2022-01-21 ENCOUNTER — Inpatient Hospital Stay (HOSPITAL_COMMUNITY)
Admission: AD | Admit: 2022-01-21 | Discharge: 2022-01-21 | Disposition: A | Discharge status: Home / Self Care | Payer: Medicaid Other | Attending: Obstetrics & Gynecology | Admitting: Obstetrics & Gynecology

## 2022-01-21 DIAGNOSIS — Z3A10 10 weeks gestation of pregnancy: Secondary | ICD-10-CM | POA: Insufficient documentation

## 2022-01-21 DIAGNOSIS — O209 Hemorrhage in early pregnancy, unspecified: Secondary | ICD-10-CM | POA: Insufficient documentation

## 2022-01-21 DIAGNOSIS — Z6790 Unspecified blood type, Rh positive: Secondary | ICD-10-CM

## 2022-01-21 DIAGNOSIS — O26891 Other specified pregnancy related conditions, first trimester: Secondary | ICD-10-CM | POA: Diagnosis present

## 2022-01-21 DIAGNOSIS — O021 Missed abortion: Secondary | ICD-10-CM | POA: Diagnosis not present

## 2022-01-21 LAB — CBC
HCT: 38.4 % (ref 36.0–46.0)
Hemoglobin: 12.8 g/dL (ref 12.0–15.0)
MCH: 29.7 pg (ref 26.0–34.0)
MCHC: 33.3 g/dL (ref 30.0–36.0)
MCV: 89.1 fL (ref 80.0–100.0)
Platelets: 219 10*3/uL (ref 150–400)
RBC: 4.31 MIL/uL (ref 3.87–5.11)
RDW: 13.5 % (ref 11.5–15.5)
WBC: 10.7 10*3/uL — ABNORMAL HIGH (ref 4.0–10.5)
nRBC: 0 % (ref 0.0–0.2)

## 2022-01-21 LAB — HCG, QUANTITATIVE, PREGNANCY: hCG, Beta Chain, Quant, S: 15563 m[IU]/mL — ABNORMAL HIGH (ref <5)

## 2022-01-21 NOTE — MAU Note (Signed)
..  Julia Johnson is a 41 y.o. at [redacted]w[redacted]d here in MAU reporting: vaginal bleeding and she passed a dime-sized clot and some on her panty-liner. No abdominal pain.  Reports she has not had her first OB appointment and has one scheduled.  Pain score: 0/10 Vitals:   01/21/22 1923  BP: 131/85  Pulse: 99  Resp: 17  Temp: 98.1 F (36.7 C)  SpO2: 100%     FHT: unable to doppler in triage

## 2022-01-21 NOTE — MAU Provider Note (Signed)
History     CSN: [id number]  Arrival date and time: 01/21/22 6503   Event Date/Time   First Provider Initiated Contact with Patient 01/21/22 2125      Chief Complaint  Patient presents with   Vaginal Bleeding   HPI Julia Johnson is a 41 y.o. [id number] at [redacted]w[redacted]d who presents to MAU with chief complaint of bleeding. Patient states she has been spotting throughout her pregnancy but today passed a dime-sized clot for the first time. She denies pain. She denies all other complaints including dysuria, abdominal tenderness, back pain, fever or recent illness. She is remote from intercourse.  Patient endorses seeing cardiac flicker on a 6 week bedside ultrasound conducted by Dr. Crissie Reese in MAU on 12/27/2021.  OB History     Gravida  5   Para  3   Term  3   Preterm      AB  1   Living  3      SAB  1   IAB      Ectopic      Multiple      Live Births  3           Past Medical History:  Diagnosis Date   ADHD (attention deficit hyperactivity disorder)    Anxiety    Carpal tunnel syndrome    Celiac disease    Depression    Diabetes mellitus without complication ([facility])    Gestational diabetes    glyburide   Hyperlipidemia    Obesity    Pregnancy induced hypertension    UTI (urinary tract infection)     Past Surgical History:  Procedure Laterality Date   MOUTH SURGERY     SKIN BIOPSY  2012    Family History  Problem Relation Age of Onset   Cancer Mother        breast   Diabetes Mother    Factor V Leiden deficiency Mother    Heart failure Mother    Heart disease Mother    Hypertension Father    Diabetes Father    Aneurysm Father    Kidney disease Father        on dialysis   Diabetes Maternal Grandmother    Congestive Heart Failure Maternal Grandmother    Celiac disease Daughter    Diabetes Paternal Grandfather    Congestive Heart Failure Paternal Grandfather    Other Daughter        Celiac gene    Social History   Tobacco Use   Smoking  status: Never   Smokeless tobacco: Never  Substance Use Topics   Alcohol use: No   Drug use: No    Allergies:  Allergies  Allergen Reactions   Gluten Meal     Celiac disease    Medications Prior to Admission  Medication Sig Dispense Refill Last Dose   Acetaminophen (TYLENOL PO) Take by mouth.      cholecalciferol (VITAMIN D3) 25 MCG (1000 UNIT) tablet Take 1,000 Units by mouth daily.      hydrocortisone (ANUSOL-HC) 25 MG suppository Place 25 mg rectally 2 (two) times daily.      Prenatal Vit-Fe Fumarate-FA (PNV PRENATAL PLUS MULTIVITAMIN) 27-1 MG TABS Take 1 tablet by mouth once. 30 tablet 11    UNABLE TO FIND Essential oils       Review of Systems  Genitourinary:  Positive for vaginal bleeding.  All other systems reviewed and are negative.  Physical Exam   Blood pressure 131/85, pulse 99, temperature 98.1  F (36.7 C), temperature source Oral, resp. rate 17, height 5\' 7"  (1.702 m), weight 116.4 kg, last menstrual period 11/10/2021, SpO2 100 %.  Physical Exam Vitals and nursing note reviewed. Exam conducted with a chaperone present.  Constitutional:      Appearance: Normal appearance.  Cardiovascular:     Rate and Rhythm: Normal rate.  Pulmonary:     Effort: Pulmonary effort is normal.  Abdominal:     Tenderness: There is no abdominal tenderness.  Skin:    Capillary Refill: Capillary refill takes less than 2 seconds.  Neurological:     Mental Status: She is alert and oriented to person, place, and time.  Psychiatric:        Mood and Affect: Mood normal.        Behavior: Behavior normal.        Thought Content: Thought content normal.        Judgment: Judgment normal.     MAU Course  Procedures  MDM  --Missed ab diagnosed on this evening's ultrasound. Discussed options for follow-up including Cytotec vs Expectant Management. Patient with history of miscarriage and previous very negative experience with Cytotec  Orders Placed This Encounter  Procedures   US  OB LESS THAN 14 WEEKS WITH OB TRANSVAGINAL   CBC   hCG, quantitative, pregnancy   Discharge patient   Results for orders placed or performed during the hospital encounter of 01/21/22 (from the past 72 hour(s))  CBC     Status: Abnormal   Collection Time: 01/21/22  8:03 PM  Result Value Ref Range   WBC 10.7 (H) 4.0 - 10.5 K/uL   RBC 4.31 3.87 - 5.11 MIL/uL   Hemoglobin 12.8 12.0 - 15.0 g/dL   HCT 38.4 36.0 - 46.0 %   MCV 89.1 80.0 - 100.0 fL   MCH 29.7 26.0 - 34.0 pg   MCHC 33.3 30.0 - 36.0 g/dL   RDW 13.5 11.5 - 15.5 %   Platelets 219 150 - 400 K/uL   nRBC 0.0 0.0 - 0.2 %    Comment: Performed at [facility], [place] [address], [place], [place] [address]  hCG, quantitative, pregnancy     Status: Abnormal   Collection Time: 01/21/22  8:03 PM  Result Value Ref Range   hCG, Beta Chain, Quant, S 15,563 (H) <5 mIU/mL    Comment:          GEST. AGE      CONC.  (mIU/mL)   <=1 WEEK        5 - 50     2 WEEKS       50 - 500     3 WEEKS       100 - 10,000     4 WEEKS     1,000 - 30,000     5 WEEKS     3,500 - 115,000   6-8 WEEKS     12,000 - 270,000    12 WEEKS     15,000 - 220,000        FEMALE AND NON-PREGNANT FEMALE:     LESS THAN 5 mIU/mL Performed at [facility], [place] [address], [place],  [address]     US OB LESS THAN 14 WEEKS WITH [place] TRANSVAGINAL  Result Date: 01/21/2022 CLINICAL DATA:  600800 with spotting and clotting in the first trimester. 0981191 for abdominal cramping affecting pregnancy. EXAM: OBSTETRIC <14 WK [place] AND TRANSVAGINAL OB US TECHNIQUE: Both transabdominal and transvaginal ultrasound  examinations were performed for complete evaluation of the gestation as well as the maternal uterus, adnexal regions, and pelvic cul-de-sac. Transvaginal technique was performed to assess early pregnancy. COMPARISON:  None Available. FINDINGS: Intrauterine gestational sac: Single. Yolk sac:  Not Visualized. Embryo:  Visualized. Cardiac Activity: None. MSD: Not  measured. CRL:  22.8 mm   9 w   0 d Subchorionic hemorrhage:  None visualized. Maternal uterus/adnexae: Anteverted uterus, measures 12.0 x 8.0 by 8.0 cm. No wall mass is seen. There are cervical nabothian cysts. The cervix is closed. Cervix length 4.0 cm. The ovaries are not well seen due to deep pelvic position and surrounding bowel but no obvious mass was seen. No free fluid or adnexal mass is evident. IMPRESSION: 1. Pulseless fetus measuring 9 weeks consistent with intrauterine fetal demise. 2. Closed cervix measuring 4 cm length, with nabothian cysts. 3. No uterine wall mass is seen or subchorionic hemorrhage. 4. The ovaries are not well evaluated but grossly unremarkable. Electronically Signed   By: Almira Bar M.D.   On: 01/21/2022 20:45    Assessment and Plan  --41 y.o. [id number] with confirmed Missed Ab --Hgb 12.8 --Blood type B POS --Expectant management per patient preference --Discharge home in stable condition with bleeding precautions  F/U: --Advised to have office follow-up in about one week --Patient planning to follow-up with FNP at outside facility  Calvert Cantor, MSA, MSN, CNM

## 2022-01-25 ENCOUNTER — Inpatient Hospital Stay (HOSPITAL_COMMUNITY): Payer: Medicaid Other

## 2022-01-25 ENCOUNTER — Encounter (HOSPITAL_COMMUNITY): Payer: Self-pay | Admitting: Obstetrics and Gynecology

## 2022-01-25 ENCOUNTER — Other Ambulatory Visit: Payer: Self-pay

## 2022-01-25 ENCOUNTER — Inpatient Hospital Stay (HOSPITAL_COMMUNITY)
Admission: AD | Admit: 2022-01-25 | Discharge: 2022-01-25 | Disposition: A | Discharge status: Home / Self Care | Payer: Medicaid Other | Attending: Obstetrics and Gynecology | Admitting: Obstetrics and Gynecology

## 2022-01-25 DIAGNOSIS — Z3A10 10 weeks gestation of pregnancy: Secondary | ICD-10-CM

## 2022-01-25 DIAGNOSIS — O039 Complete or unspecified spontaneous abortion without complication: Secondary | ICD-10-CM | POA: Diagnosis not present

## 2022-01-25 LAB — CBC
HCT: 35.5 % — ABNORMAL LOW (ref 36.0–46.0)
Hemoglobin: 11.7 g/dL — ABNORMAL LOW (ref 12.0–15.0)
MCH: 29.4 pg (ref 26.0–34.0)
MCHC: 33 g/dL (ref 30.0–36.0)
MCV: 89.2 fL (ref 80.0–100.0)
Platelets: 188 10*3/uL (ref 150–400)
RBC: 3.98 MIL/uL (ref 3.87–5.11)
RDW: 13.7 % (ref 11.5–15.5)
WBC: 13.1 10*3/uL — ABNORMAL HIGH (ref 4.0–10.5)
nRBC: 0 % (ref 0.0–0.2)

## 2022-01-25 LAB — HCG, QUANTITATIVE, PREGNANCY: hCG, Beta Chain, Quant, S: 4346 m[IU]/mL — ABNORMAL HIGH (ref <5)

## 2022-01-25 NOTE — MAU Note (Signed)
Julia Johnson is a 41 y.o. at [redacted]w[redacted]d here in MAU reporting: reports she miscarried around 61, feels like she passed the pregnancy. Since then has continued to have bleeding that is heavier than a normal period. Reports husband wanted her to be evaluated.   Onset of complaint: ongoing but worse today  Pain score: 3/10  Vitals:   01/25/22 1736  BP: 133/73  Pulse: (!) 114  Resp: 18  Temp: 98.2 F (36.8 C)  SpO2: 98%     Lab orders placed from triage: none

## 2022-01-25 NOTE — MAU Provider Note (Signed)
History     CSN: [id number]  Arrival date and time: 01/25/22 1722   Event Date/Time   First Provider Initiated Contact with Patient 01/25/22 1828      Chief Complaint  Patient presents with   Abdominal Pain   Vaginal Bleeding   Dizziness   HPI  Julia Johnson is a 41 y.o. [id number] at [redacted]w[redacted]d who presents for evaluation of vaginal bleeding. Patient reports she thinks she miscarried around 55 and has continued to have more bleeding than a period. She reports she has stopped passing large clots and tissue. She reports it is coming in intermittent gushes now. She reports intermittent lower abdominal pain that she rates a 3/10.   She reports she just wants to be sure she hasn't had too much bleeding and that she has passed everything.    OB History     Gravida  5   Para  3   Term  3   Preterm      AB  1   Living  3      SAB  1   IAB      Ectopic      Multiple      Live Births  3           Past Medical History:  Diagnosis Date   ADHD (attention deficit hyperactivity disorder)    Anxiety    Carpal tunnel syndrome    Celiac disease    Depression    Diabetes mellitus without complication (HCC)    Gestational diabetes    glyburide   Hyperlipidemia    Obesity    Pregnancy induced hypertension    UTI (urinary tract infection)     Past Surgical History:  Procedure Laterality Date   MOUTH SURGERY     SKIN BIOPSY  2012    Family History  Problem Relation Age of Onset   Cancer Mother        breast   Diabetes Mother    Factor V Leiden deficiency Mother    Heart failure Mother    Heart disease Mother    Hypertension Father    Diabetes Father    Aneurysm Father    Kidney disease Father        on dialysis   Diabetes Maternal Grandmother    Congestive Heart Failure Maternal Grandmother    Celiac disease Daughter    Diabetes Paternal Grandfather    Congestive Heart Failure Paternal Grandfather    Other Daughter        Celiac gene    Social  History   Tobacco Use   Smoking status: Never   Smokeless tobacco: Never  Substance Use Topics   Alcohol use: No   Drug use: No    Allergies:  Allergies  Allergen Reactions   Gluten Meal     Celiac disease    Medications Prior to Admission  Medication Sig Dispense Refill Last Dose   Acetaminophen (TYLENOL PO) Take by mouth.   Past Week   cholecalciferol (VITAMIN D3) 25 MCG (1000 UNIT) tablet Take 1,000 Units by mouth daily.   01/25/2022   Ibuprofen-Acetaminophen (ADVIL DUAL ACTION) 125-250 MG TABS Take by mouth.   01/25/2022   Prenatal Vit-Fe Fumarate-FA (PNV PRENATAL PLUS MULTIVITAMIN) 27-1 MG TABS Take 1 tablet by mouth once. 30 tablet 11 01/25/2022   hydrocortisone (ANUSOL-HC) 25 MG suppository Place 25 mg rectally 2 (two) times daily.   More than a month   UNABLE TO FIND Essential oils  Review of Systems  Constitutional: Negative.  Negative for fatigue and fever.  HENT: Negative.    Respiratory: Negative.  Negative for shortness of breath.   Cardiovascular: Negative.  Negative for chest pain.  Gastrointestinal:  Positive for abdominal pain. Negative for constipation, diarrhea, nausea and vomiting.  Genitourinary:  Positive for vaginal bleeding. Negative for dysuria and vaginal discharge.  Neurological: Negative.  Negative for dizziness and headaches.   Physical Exam   Blood pressure (!) 114/58, pulse (!) 115, temperature 98.3 F (36.8 C), temperature source Oral, resp. rate 18, height 5\' 7"  (1.702 m), weight 116.9 kg, last menstrual period 11/10/2021, SpO2 98 %.  Patient Vitals for the past 24 hrs:  BP Temp Temp src Pulse Resp SpO2 Height Weight  01/25/22 1814 (!) 114/58 -- -- (!) 115 18 -- -- --  01/25/22 1801 -- 98.3 F (36.8 C) Oral -- -- -- -- --  01/25/22 1736 133/73 98.2 F (36.8 C) Oral (!) 114 18 98 % -- --  01/25/22 1731 -- -- -- -- -- -- 5\' 7"  (1.702 m) 116.9 kg    Physical Exam Vitals and nursing note reviewed.  Constitutional:      General: She  is not in acute distress.    Appearance: She is well-developed.  HENT:     Head: Normocephalic.  Eyes:     Pupils: Pupils are equal, round, and reactive to light.  Cardiovascular:     Rate and Rhythm: Normal rate and regular rhythm.     Heart sounds: Normal heart sounds.  Pulmonary:     Effort: Pulmonary effort is normal. No respiratory distress.     Breath sounds: Normal breath sounds.  Abdominal:     General: Bowel sounds are normal. There is no distension.     Palpations: Abdomen is soft.     Tenderness: There is no abdominal tenderness.  Genitourinary:    Vagina: Bleeding present.  Skin:    General: Skin is warm and dry.  Neurological:     Mental Status: She is alert and oriented to person, place, and time.  Psychiatric:        Mood and Affect: Mood normal.        Behavior: Behavior normal.        Thought Content: Thought content normal.        Judgment: Judgment normal.     MAU Course  Procedures  Results for orders placed or performed during the hospital encounter of 01/25/22 (from the past 24 hour(s))  CBC     Status: Abnormal   Collection Time: 01/25/22  5:53 PM  Result Value Ref Range   WBC 13.1 (H) 4.0 - 10.5 K/uL   RBC 3.98 3.87 - 5.11 MIL/uL   Hemoglobin 11.7 (L) 12.0 - 15.0 g/dL   HCT 13/03/23 (L) 13/03/23 - 35.3 %   MCV 89.2 80.0 - 100.0 fL   MCH 29.4 26.0 - 34.0 pg   MCHC 33.0 30.0 - 36.0 g/dL   RDW 61.4 43.1 - 54.0 %   Platelets 188 150 - 400 K/uL   nRBC 0.0 0.0 - 0.2 %  hCG, quantitative, pregnancy     Status: Abnormal   Collection Time: 01/25/22  5:53 PM  Result Value Ref Range   hCG, Beta Chain, Quant, S 4,346 (H) <5 mIU/mL     76.1 Pelvis Complete  Result Date: 01/25/2022 CLINICAL DATA:  Post miscarriage.  Ten weeks postpartum. EXAM: TRANSABDOMINAL ULTRASOUND OF PELVIS TECHNIQUE: Transabdominal ultrasound examination of the pelvis was  performed including evaluation of the uterus, ovaries, adnexal regions, and pelvic cul-de-sac. COMPARISON:  January 21, 2022 FINDINGS: Uterus Measurements: 10.7 cm x 6.9 cm x 8.2 cm = volume: 315.9 mL. No fibroids or other mass visualized. Endometrium Thickness: 6.9.  No focal abnormality visualized. Right ovary The right ovary is not visualized. Left ovary Measurements: 2.9 cm x 1.6 cm x 2.4 cm = volume: 5.6 mL. Normal appearance/no adnexal mass. Other findings:  No abnormal free fluid. The study is limited secondary to the patient's body habitus. IMPRESSION: 1. Limited study secondary to the patient's body habitus. 2. Nonvisualization of the right ovary. 3. Otherwise, unremarkable pelvic ultrasound. Electronically Signed   By: Virgina Norfolk M.D.   On: 01/25/2022 19:29     MDM Labs ordered and reviewed.   CBC, HCG US Pelvis complete  Reviewed reassuring imaging and exam- discussed vaginal bleeding and pain precautions and SAB follow up  Assessment and Plan   1. Miscarriage   2. [redacted] weeks gestation of pregnancy     -Discharge home in stable condition -Vaginal bleeding and pain precautions discussed -Patient advised to follow-up with [facility] in 1 week for HCG and 2 weeks with a provider -Patient may return to MAU as needed or if her condition were to change or worsen  Wende Mott, CNM 01/25/2022, 6:28 PM

## 2022-01-31 ENCOUNTER — Encounter: Payer: BLUE CROSS/BLUE SHIELD | Admitting: Family Medicine

## 2022-02-04 ENCOUNTER — Other Ambulatory Visit: Payer: Medicaid Other

## 2022-02-04 ENCOUNTER — Encounter: Payer: Self-pay | Admitting: Women's Health

## 2022-02-04 DIAGNOSIS — R5383 Other fatigue: Secondary | ICD-10-CM

## 2022-02-04 DIAGNOSIS — O039 Complete or unspecified spontaneous abortion without complication: Secondary | ICD-10-CM

## 2022-02-05 LAB — CBC
Hematocrit: 35.9 % (ref 34.0–46.6)
Hemoglobin: 12.1 g/dL (ref 11.1–15.9)
MCH: 29.6 pg (ref 26.6–33.0)
MCHC: 33.7 g/dL (ref 31.5–35.7)
MCV: 88 fL (ref 79–97)
Platelets: 224 10*3/uL (ref 150–450)
RBC: 4.09 x10E6/uL (ref 3.77–5.28)
RDW: 13.6 % (ref 11.7–15.4)
WBC: 7.3 10*3/uL (ref 3.4–10.8)

## 2022-02-05 LAB — BETA HCG QUANT (REF LAB): hCG Quant: 30 m[IU]/mL

## 2022-02-11 ENCOUNTER — Encounter: Payer: Self-pay | Admitting: Women's Health

## 2022-02-11 ENCOUNTER — Other Ambulatory Visit: Payer: Self-pay | Admitting: Women's Health

## 2022-02-11 ENCOUNTER — Ambulatory Visit (INDEPENDENT_AMBULATORY_CARE_PROVIDER_SITE_OTHER): Payer: Medicaid Other | Admitting: Women's Health

## 2022-02-11 VITALS — BP 125/75 | HR 96 | Ht 67.0 in | Wt 255.0 lb

## 2022-02-11 DIAGNOSIS — R928 Other abnormal and inconclusive findings on diagnostic imaging of breast: Secondary | ICD-10-CM

## 2022-02-11 DIAGNOSIS — Z8639 Personal history of other endocrine, nutritional and metabolic disease: Secondary | ICD-10-CM | POA: Diagnosis not present

## 2022-02-11 DIAGNOSIS — N6489 Other specified disorders of breast: Secondary | ICD-10-CM

## 2022-02-11 DIAGNOSIS — O039 Complete or unspecified spontaneous abortion without complication: Secondary | ICD-10-CM

## 2022-02-11 DIAGNOSIS — N920 Excessive and frequent menstruation with regular cycle: Secondary | ICD-10-CM

## 2022-02-11 DIAGNOSIS — Z862 Personal history of diseases of the blood and blood-forming organs and certain disorders involving the immune mechanism: Secondary | ICD-10-CM | POA: Diagnosis not present

## 2022-02-11 DIAGNOSIS — Z30011 Encounter for initial prescription of contraceptive pills: Secondary | ICD-10-CM

## 2022-02-11 DIAGNOSIS — R252 Cramp and spasm: Secondary | ICD-10-CM | POA: Diagnosis not present

## 2022-02-11 MED ORDER — SLYND 4 MG PO TABS: 1.0000 | ORAL_TABLET | Freq: Every day | ORAL | 3 refills | Status: DC

## 2022-02-11 NOTE — Progress Notes (Addendum)
GYN VISIT Patient name: Julia Johnson MRN [id number]  Date of birth: 17-Apr-1980 Chief Complaint:   Miscarriage  History of Present Illness:   Julia Johnson is a 41 y.o. [phone] Caucasian female being seen today for f/u after 10wk SAB 2wks ago (11/3). Doing well, bleeding has stopped.  HCG 30 (11/13), was 15,563 (10/30) and 4,346 (11/3). Hgb 12.1 (was 11.7 on 11/3). Wants iron panel checked, has h/o IDA (with normal hgb). Currently taking chelated Fe (better absorbed w/ her celiac disease). Having leg/feet pains/cramps, would like electrolytes checked. On Vit D supplementation, would also liked this checked. Almost certain she doesn't want another pregnancy, husband definitely doesn't. Has long standing h/o heavy periods and passing large clots with tissue, wears a depends and sometimes soaks through. Has not done well historically with birth control. Has also tried TXA in past. Was most recently on birth control patch. Last seen w/ [place] in 2019 when she was having menorrhagia w/ heavy rectal bleeding only during periods- had a TCS which showed diverticulosis and internal hemorrhoids- no obvious cause of rectal bleeding w/ periods. Possible endometriosis. Began seeing a reproductive endocrinologist w/ [facility], states she did not receive any answers. May potentially be interested in surgical intervention, but wants to wait until all of hormonal shifts/emotions from recent pregnancy are gone before she makes that decision.  Had mammogram 06/13/21, birads 3 'probably benign findings' focal asymmetry Lt breast, was supposed to f/u [facility] but had insurance issues, then got pregnant. Wants to f/u at [facility].  Last pelvic u/s 2019 w/ [place] wnl.   No LMP recorded. The current method of family planning is abstinence.  Last pap 08/26/20. Results were: NILM     02/11/2022    9:47 AM 05/20/2016   10:37 AM 12/20/2015    4:07 PM  Depression screen PHQ 2/9  Decreased Interest 0 0 0  Down, Depressed, Hopeless 1 0 0  PHQ - 2  Score 1 0 0  Altered sleeping 0  0  Tired, decreased energy 1  3  Change in appetite 0  0  Feeling bad or failure about yourself  0  0  Trouble concentrating 1  2  Moving slowly or fidgety/restless 1  0  Suicidal thoughts 0  0  PHQ-9 Score 4  5        02/11/2022    9:47 AM  GAD 7 : Generalized Anxiety Score  Nervous, Anxious, on Edge 1  Control/stop worrying 0  Worry too much - different things 1  Trouble relaxing 0  Restless 0  Easily annoyed or irritable 1  Afraid - awful might happen 0  Total GAD 7 Score 3     Review of Systems:   Pertinent items are noted in HPI Denies fever/chills, dizziness, headaches, visual disturbances, fatigue, shortness of breath, chest pain, abdominal pain, vomiting, abnormal vaginal discharge/itching/odor/irritation, problems with periods, bowel movements, urination, or intercourse unless otherwise stated above.  Pertinent History Reviewed:  Reviewed past medical,surgical, social, obstetrical and family history.  Reviewed problem list, medications and allergies. Physical Assessment:   Vitals:   02/11/22 0946  BP: 125/75  Pulse: 96  Weight: 255 lb (115.7 kg)  Height: _0  (1.702 m)  Body mass index is 39.94 kg/m.       Physical Examination:   General appearance: alert, well appearing, and in no distress  Mental status: alert, oriented to person, place, and time  Skin: warm & dry   Cardiovascular: normal heart rate noted  Respiratory: normal  respiratory effort, no distress  Abdomen: soft, non-tender   Pelvic: examination not indicated  Extremities: no edema   Chaperone: N/A    No results found for this or any previous visit (from the past 24 hour(s)).  Assessment & Plan:  1) Recent 10wk SAB> doing well, last HCG down to 30 (from 15K+)  2) Long standing h/o menorrhagia> did have rectal bleeding w/ periods when we last saw her 26yrago, will get pelvic u/s in about a month and f/u w/ MD. Historically doesn't do well w/ birth  control, has tried TXA, may be interested in surgical management.  3) H/O anemia> hgb 12.1 on 11/13, iron panel per pt request  4) Leg/feet pain/cramping> would like electrolytes checked, will get CMP  5) H/O Vit D deficiency> Vit D level   6) [facility], wants to do at [facility], ordered and note routed to MBaileytonto schedule  7) Contraception management> would like to go ahead and start something for pregnancy prevention right now. Discussed options, d/t not doing well on most in past, and possible endometriosis, will try Slynd.   Meds:  Meds ordered this encounter  Medications   Drospirenone (SLYND) 4 MG TABS    Sig: Take 1 tablet by mouth daily.    Dispense:  90 tablet    Refill:  3    Orders Placed This Encounter  Procedures   [place]PELVIC COMPLETE WITH TRANSVAGINAL   MM DIAG BREAST TOMO BILATERAL   Iron, TIBC and Ferritin Panel   VITAMIN D 25 Hydroxy (Vit-D Deficiency, Fractures)   Comp Met (CMET)    Return for GYN u/s at [facility] at least 144m from now, then f/u here w/ Dr. OzNelda Marseillefter.  [facility]1/20/2023 2:04 PM

## 2022-02-12 LAB — IRON,TIBC AND FERRITIN PANEL
Ferritin: 64 ng/mL (ref 15–150)
Iron Saturation: 18 % (ref 15–55)
Iron: 60 ug/dL (ref 27–159)
Total Iron Binding Capacity: 340 ug/dL (ref 250–450)
UIBC: 280 ug/dL (ref 131–425)

## 2022-02-12 LAB — COMPREHENSIVE METABOLIC PANEL
ALT: 22 IU/L (ref 0–32)
AST: 17 IU/L (ref 0–40)
Albumin/Globulin Ratio: 2 (ref 1.2–2.2)
Albumin: 4.5 g/dL (ref 3.9–4.9)
Alkaline Phosphatase: 63 IU/L (ref 44–121)
BUN/Creatinine Ratio: 13 (ref 9–23)
BUN: 10 mg/dL (ref 6–24)
Bilirubin Total: 0.5 mg/dL (ref 0.0–1.2)
CO2: 20 mmol/L (ref 20–29)
Calcium: 9.4 mg/dL (ref 8.7–10.2)
Chloride: 100 mmol/L (ref 96–106)
Creatinine, Ser: 0.8 mg/dL (ref 0.57–1.00)
Globulin, Total: 2.2 g/dL (ref 1.5–4.5)
Glucose: 107 mg/dL — ABNORMAL HIGH (ref 70–99)
Potassium: 4.4 mmol/L (ref 3.5–5.2)
Sodium: 141 mmol/L (ref 134–144)
Total Protein: 6.7 g/dL (ref 6.0–8.5)
eGFR: 95 mL/min/{1.73_m2} (ref >59)

## 2022-02-12 LAB — VITAMIN D 25 HYDROXY (VIT D DEFICIENCY, FRACTURES): Vit D, 25-Hydroxy: 44.1 ng/mL (ref 30.0–100.0)

## 2022-02-28 ENCOUNTER — Inpatient Hospital Stay
Admission: RE | Admit: 2022-02-28 | Discharge: 2022-02-28 | Disposition: A | Discharge status: Home / Self Care | Payer: Self-pay | Source: Ambulatory Visit | Attending: Women's Health | Admitting: Women's Health

## 2022-02-28 ENCOUNTER — Ambulatory Visit
Admission: RE | Admit: 2022-02-28 | Discharge: 2022-02-28 | Disposition: A | Discharge status: Home / Self Care | Payer: Self-pay | Source: Ambulatory Visit | Attending: Women's Health | Admitting: Women's Health

## 2022-02-28 ENCOUNTER — Other Ambulatory Visit: Payer: Self-pay | Admitting: Women's Health

## 2022-02-28 DIAGNOSIS — N6489 Other specified disorders of breast: Secondary | ICD-10-CM

## 2022-02-28 DIAGNOSIS — R928 Other abnormal and inconclusive findings on diagnostic imaging of breast: Secondary | ICD-10-CM

## 2022-03-05 ENCOUNTER — Ambulatory Visit (HOSPITAL_COMMUNITY)
Admission: RE | Admit: 2022-03-05 | Discharge: 2022-03-05 | Disposition: A | Discharge status: Home / Self Care | Payer: Medicaid Other | Source: Ambulatory Visit | Attending: Women's Health | Admitting: Women's Health

## 2022-03-05 ENCOUNTER — Encounter (HOSPITAL_COMMUNITY): Payer: Self-pay | Admitting: Radiology

## 2022-03-05 DIAGNOSIS — N6489 Other specified disorders of breast: Secondary | ICD-10-CM

## 2022-03-05 DIAGNOSIS — R928 Other abnormal and inconclusive findings on diagnostic imaging of breast: Secondary | ICD-10-CM | POA: Diagnosis present

## 2022-03-12 ENCOUNTER — Encounter: Payer: Self-pay | Admitting: Women's Health

## 2022-03-13 ENCOUNTER — Ambulatory Visit (HOSPITAL_BASED_OUTPATIENT_CLINIC_OR_DEPARTMENT_OTHER)
Admission: RE | Admit: 2022-03-13 | Discharge: 2022-03-13 | Disposition: A | Discharge status: Home / Self Care | Payer: Medicaid Other | Source: Ambulatory Visit | Attending: Women's Health | Admitting: Women's Health

## 2022-03-13 DIAGNOSIS — N920 Excessive and frequent menstruation with regular cycle: Secondary | ICD-10-CM | POA: Insufficient documentation

## 2022-03-15 ENCOUNTER — Other Ambulatory Visit (HOSPITAL_COMMUNITY)
Admission: RE | Admit: 2022-03-15 | Discharge: 2022-03-15 | Disposition: A | Discharge status: Home / Self Care | Payer: Medicaid Other | Source: Ambulatory Visit | Attending: Obstetrics & Gynecology | Admitting: Obstetrics & Gynecology

## 2022-03-15 ENCOUNTER — Encounter: Payer: Self-pay | Admitting: Obstetrics & Gynecology

## 2022-03-15 ENCOUNTER — Ambulatory Visit (INDEPENDENT_AMBULATORY_CARE_PROVIDER_SITE_OTHER): Payer: Medicaid Other | Admitting: Obstetrics & Gynecology

## 2022-03-15 VITALS — BP 130/79 | HR 112 | Ht 67.0 in | Wt 251.8 lb

## 2022-03-15 DIAGNOSIS — Z124 Encounter for screening for malignant neoplasm of cervix: Secondary | ICD-10-CM

## 2022-03-15 DIAGNOSIS — N939 Abnormal uterine and vaginal bleeding, unspecified: Secondary | ICD-10-CM

## 2022-03-15 DIAGNOSIS — R9389 Abnormal findings on diagnostic imaging of other specified body structures: Secondary | ICD-10-CM | POA: Insufficient documentation

## 2022-03-15 DIAGNOSIS — Z803 Family history of malignant neoplasm of breast: Secondary | ICD-10-CM | POA: Diagnosis not present

## 2022-03-15 NOTE — Progress Notes (Signed)
GYN VISIT Patient name: Julia Johnson MRN [id number]  Date of birth: 1980/04/03 Chief Complaint:   discuss [place] results  History of Present Illness:   Julia Johnson is a 41 y.o. [phone] female being seen today for the following concerns:    -AUB: After her last pregnancy, she has continued to note heavy periods- tried IUD (notes pysch side effects) and tried pills.  Seen at [facility] to get 2nd opinion, tried TXA noted considerable pelvic pain and patch.  Menses are regular each month- last for 6-7 days.  Start light spotting then Day 2-3 heavy day x 1 with passage of clots/tissue, lighten up then heavy again.  Takes Advil/Tylenol which improved her bleeding and cramping.  Currenly on [place] and as of now only notes light bleeding.  She did have some irregular bleeding, which she thinks may have been due to diet sensitivity- ate gluten (pt with h/o Celiac disease)  FTNSVD x 3  After this last miscarriage, they have decided they do not want any more children.  Pelvic US:02/2022:  9.7 x 6.2 x 6.7 cm = volume: 202.5 mL. No fibroids or other mass visualized.     02/11/2022    9:47 AM 05/20/2016   10:37 AM 12/20/2015    4:07 PM  Depression screen PHQ 2/9  Decreased Interest 0 0 0  Down, Depressed, Hopeless 1 0 0  PHQ - 2 Score 1 0 0  Altered sleeping 0  0  Tired, decreased energy 1  3  Change in appetite 0  0  Feeling bad or failure about yourself  0  0  Trouble concentrating 1  2  Moving slowly or fidgety/restless 1  0  Suicidal thoughts 0  0  PHQ-9 Score 4  5     Review of Systems:   Pertinent items are noted in HPI Denies fever/chills, dizziness, headaches, visual disturbances, fatigue, shortness of breath, chest pain, abdominal pain, vomiting, no problems with bowel movements, urination, or intercourse unless otherwise stated above.  Pertinent History Reviewed:  Reviewed past medical,surgical, social, obstetrical and family history.  Reviewed problem list, medications and  allergies. Physical Assessment:   Vitals:   03/15/22 1009  BP: 130/79  Pulse: (!) 112  Weight: 251 lb 12.8 oz (114.2 kg)  Height: 5\' 7"  (1.702 m)  Body mass index is 39.44 kg/m.       Physical Examination:   General appearance: alert, well appearing, and in no distress  Psych: mood appropriate, normal affect  Skin: warm & dry   Cardiovascular: normal heart rate noted  Respiratory: normal respiratory effort, no distress  Abdomen: obese, soft, non-tender   Pelvic: VULVA: normal appearing vulva with no masses, tenderness or lesions, VAGINA: normal appearing vagina with normal color and discharge, no lesions, CERVIX: normal appearing cervix without discharge or lesions, UTERUS: uterus is normal size, shape, consistency and nontender  Extremities: no edema   Chaperone:    Endometrial Biopsy Procedure Note  Pre-operative Diagnosis: AUB, thickened endometrium  Post-operative Diagnosis: same  Procedure Details  The risks (including infection, bleeding, pain, and uterine perforation) and benefits of the procedure were explained to the patient and Written informed consent was obtained.  Antibiotic prophylaxis against endocarditis was not indicated.   The patient was placed in the dorsal lithotomy position.  Bimanual exam showed the uterus to be in the neutral position.  A speculum inserted in the vagina, and the cervix prepped with betadine.     A single tooth tenaculum was applied  to the anterior lip of the cervix for stabilization.  A sterile uterine sound was used to sound the uterus to a depth of 9cm.  A Pipelle endometrial aspirator was used to sample the endometrium.  Sample was sent for pathologic examination.  Condition: Stable  Complications: None     Assessment & Plan:  1) AUB -reviewed conservative vs surgical intervention.  Discussed all options risk/benefit of each including POPs, IUD, Depot, endometrial ablation or hysterectomy -due to failure with  prior medication, advised EMB today- see above -Next step pending results of pathology -for now plan to continue with Slynd as she does note some improvement, plan to recheck in 3 mos -[]  if no improvement at that time, plan to revisit surgical options  The patient was advised to call for any fever or for prolonged or severe pain or bleeding. She was advised to use OTC analgesics as needed for mild to moderate pain. She was advised to avoid vaginal intercourse for 48 hours or until the bleeding has completely stopped.  2) Cervical cancer screening -last pap outside facility, not sure on dates -pap collected today  3) family h/o breast cancer -reviewed genetic screening -discussed pros/cons and what results may suggest -after much discussion, pt desires testing today   Orders Placed This Encounter  Procedures   [facility] Multi-Cancer (2+38)    Return in about 2 months (around 05/16/2022) for med follow up (ok for televisit).   05/18/2022, DO Attending Obstetrician & Gynecologist, [facility] for [facility]

## 2022-03-19 LAB — SURGICAL PATHOLOGY

## 2022-03-20 LAB — CYTOLOGY - PAP
Comment: NEGATIVE
Diagnosis: NEGATIVE
High risk HPV: NEGATIVE

## 2022-03-28 LAB — EMPOWER MULTI-CANCER (2 + 38): REPORT SUMMARY: NEGATIVE

## 2022-04-09 ENCOUNTER — Other Ambulatory Visit: Payer: Self-pay | Admitting: Women's Health

## 2022-04-09 DIAGNOSIS — Z1239 Encounter for other screening for malignant neoplasm of breast: Secondary | ICD-10-CM

## 2022-04-09 DIAGNOSIS — Z803 Family history of malignant neoplasm of breast: Secondary | ICD-10-CM

## 2022-05-02 ENCOUNTER — Encounter: Payer: Self-pay | Admitting: Obstetrics & Gynecology

## 2022-05-20 ENCOUNTER — Telehealth (INDEPENDENT_AMBULATORY_CARE_PROVIDER_SITE_OTHER): Payer: Medicaid Other | Admitting: Obstetrics & Gynecology

## 2022-05-20 ENCOUNTER — Encounter: Payer: Self-pay | Admitting: Obstetrics & Gynecology

## 2022-05-20 DIAGNOSIS — K900 Celiac disease: Secondary | ICD-10-CM | POA: Diagnosis not present

## 2022-05-20 DIAGNOSIS — Z8759 Personal history of other complications of pregnancy, childbirth and the puerperium: Secondary | ICD-10-CM

## 2022-05-20 DIAGNOSIS — Z3002 Counseling and instruction in natural family planning to avoid pregnancy: Secondary | ICD-10-CM

## 2022-05-20 DIAGNOSIS — N939 Abnormal uterine and vaginal bleeding, unspecified: Secondary | ICD-10-CM

## 2022-05-20 NOTE — Progress Notes (Signed)
TELEHEALTH GYNECOLOGY VISIT ENCOUNTER NOTE  Provider location: Center for [place] at [facility]   Patient location: Home  I connected with Julia Johnson on 05/20/22 at 10:10 AM EST by telephone and verified that I am speaking with the correct person using two identifiers. Patient was unable to do MyChart audiovisual encounter due to technical difficulties, she tried several times.    I discussed the limitations, risks, security and privacy concerns of performing an evaluation and management service by telephone and the availability of in person appointments. I also discussed with the patient that there may be a patient responsible charge related to this service. The patient expressed understanding and agreed to proceed.   History:  Julia Johnson is a 42 y.o. [phone] female being evaluated today for AUB.   In review, this has been an ongoing issue since her miscarriage in Nov 2023.   Following this she noted irregular/heavy periods.  While initially the [facility] may have been helping, this past period she noted very heavy bleeding with passage of "tissue."   She had two left in a pack and decided to stop.  She feels like it was causing mood changes including irritability.   Since stopping the pill, she has not had any bleeding.    She reports long-standing issues with hormonal interventions including OCPs, IUDs and other forms of contraception.  She reports that she and her husband have talked and decided to avoid medications at this time. Plan for pull out/rhythm method.  Notes irritation from condoms.  She is hopeful that her body just needed some time to "reset."   Briefly discussed ablation as option for HMB, which she is not interested in at this time.  Plan for iron and NSAID during her period and will monitor.    Past Medical History:  Diagnosis Date   ADHD (attention deficit hyperactivity disorder)    Anxiety    Carpal tunnel syndrome    Celiac disease    Depression     Diabetes mellitus without complication ([place])    Gestational diabetes    glyburide   Hyperlipidemia    Obesity    Pregnancy induced hypertension    UTI (urinary tract infection)    Past Surgical History:  Procedure Laterality Date   MOUTH SURGERY     SKIN BIOPSY  2012   The following portions of the patient's history were reviewed and updated as appropriate: allergies, current medications, past family history, past medical history, past social history, past surgical history and problem list.   Health Maintenance:  Normal pap and negative HRHPV on 02/2022.  Mammogram ordered  Review of Systems:  Pertinent items noted in HPI and remainder of comprehensive ROS otherwise negative.  Physical Exam:   General:  Alert, oriented and cooperative.   Mental Status: Normal mood and affect perceived. Normal judgment and thought content.  Physical exam deferred due to nature of the encounter   Assessment and Plan:     AUB, Contraceptive management  After much discussion, plan to monitor/track menses and bleeding pattern -take dual-action Advil and iron during menses -f/u prn or for annual   The patient was provided an opportunity to ask questions and all were answered. The patient agreed with the plan and demonstrated an understanding of the instructions.   The patient was advised to call back or seek an in-person evaluation/go to the ED if the symptoms worsen or if the condition fails to improve as anticipated.  I provided 15 minutes of non-face-to-face  time during this encounter.   Annalee Genta, [place] for [employer], [place]

## 2022-09-04 ENCOUNTER — Other Ambulatory Visit: Payer: Medicaid Other

## 2022-10-12 ENCOUNTER — Other Ambulatory Visit: Payer: Medicaid Other

## 2024-03-30 ENCOUNTER — Emergency Department (HOSPITAL_BASED_OUTPATIENT_CLINIC_OR_DEPARTMENT_OTHER)
Admission: EM | Admit: 2024-03-30 | Discharge: 2024-03-30 | Disposition: A | Discharge status: Home / Self Care | Attending: Emergency Medicine | Admitting: Emergency Medicine

## 2024-03-30 ENCOUNTER — Other Ambulatory Visit: Payer: Self-pay

## 2024-03-30 ENCOUNTER — Encounter (HOSPITAL_BASED_OUTPATIENT_CLINIC_OR_DEPARTMENT_OTHER): Payer: Self-pay | Admitting: *Deleted

## 2024-03-30 ENCOUNTER — Emergency Department (HOSPITAL_BASED_OUTPATIENT_CLINIC_OR_DEPARTMENT_OTHER)

## 2024-03-30 DIAGNOSIS — E119 Type 2 diabetes mellitus without complications: Secondary | ICD-10-CM | POA: Insufficient documentation

## 2024-03-30 DIAGNOSIS — R000 Tachycardia, unspecified: Secondary | ICD-10-CM | POA: Diagnosis not present

## 2024-03-30 DIAGNOSIS — R002 Palpitations: Secondary | ICD-10-CM | POA: Diagnosis not present

## 2024-03-30 DIAGNOSIS — R0789 Other chest pain: Secondary | ICD-10-CM | POA: Diagnosis not present

## 2024-03-30 DIAGNOSIS — R0602 Shortness of breath: Secondary | ICD-10-CM | POA: Diagnosis not present

## 2024-03-30 DIAGNOSIS — Z79899 Other long term (current) drug therapy: Secondary | ICD-10-CM | POA: Insufficient documentation

## 2024-03-30 DIAGNOSIS — R9431 Abnormal electrocardiogram [ECG] [EKG]: Secondary | ICD-10-CM | POA: Insufficient documentation

## 2024-03-30 LAB — D-DIMER, QUANTITATIVE: D-Dimer, Quant: <0.27 ug{FEU}/mL (ref 0.00–0.50)

## 2024-03-30 LAB — CBC
HCT: 37.4 % (ref 36.0–46.0)
Hemoglobin: 12.3 g/dL (ref 12.0–15.0)
MCH: 27.3 pg (ref 26.0–34.0)
MCHC: 32.9 g/dL (ref 30.0–36.0)
MCV: 83.1 fL (ref 80.0–100.0)
Platelets: 238 K/uL (ref 150–400)
RBC: 4.5 MIL/uL (ref 3.87–5.11)
RDW: 14.6 % (ref 11.5–15.5)
WBC: 9.6 K/uL (ref 4.0–10.5)
nRBC: 0 % (ref 0.0–0.2)

## 2024-03-30 LAB — TROPONIN T, HIGH SENSITIVITY
Troponin T High Sensitivity: <15 ng/L (ref 0–19)
Troponin T High Sensitivity: <15 ng/L (ref 0–19)

## 2024-03-30 LAB — COMPREHENSIVE METABOLIC PANEL WITH GFR
ALT: 20 U/L (ref 0–44)
AST: 21 U/L (ref 15–41)
Albumin: 4.3 g/dL (ref 3.5–5.0)
Alkaline Phosphatase: 59 U/L (ref 38–126)
Anion gap: 13 (ref 5–15)
BUN: 11 mg/dL (ref 6–20)
CO2: 22 mmol/L (ref 22–32)
Calcium: 9.7 mg/dL (ref 8.9–10.3)
Chloride: 101 mmol/L (ref 98–111)
Creatinine, Ser: 0.7 mg/dL (ref 0.44–1.00)
GFR, Estimated: >60 mL/min
Glucose, Bld: 109 mg/dL — ABNORMAL HIGH (ref 70–99)
Potassium: 4.2 mmol/L (ref 3.5–5.1)
Sodium: 136 mmol/L (ref 135–145)
Total Bilirubin: 0.7 mg/dL (ref 0.0–1.2)
Total Protein: 7.1 g/dL (ref 6.5–8.1)

## 2024-03-30 LAB — MAGNESIUM: Magnesium: 2 mg/dL (ref 1.7–2.4)

## 2024-03-30 LAB — TSH: TSH: 1.97 u[IU]/mL (ref 0.350–4.500)

## 2024-03-30 LAB — PRO BRAIN NATRIURETIC PEPTIDE: Pro Brain Natriuretic Peptide: 89.7 pg/mL

## 2024-03-30 NOTE — ED Provider Notes (Cosign Needed Addendum)
 " [place] EMERGENCY DEPARTMENT AT [facility] Provider Note   CSN: [id number] Arrival date & time: 03/30/24  1547     Patient presents with: Palpitations  HPI Julia Johnson is a 44 y.o. female presenting for palpitations.  Have been going on since Friday.  Also mentions some left-sided chest pain and intermittent shortness of breath.  At this time she states her symptoms have subsided.  Denies nausea or abdominal pain.  She states it might be anxiety.  Denies calf tenderness or swelling.  Past Medical History:  Diagnosis Date   ADHD (attention deficit hyperactivity disorder)    Anxiety    Carpal tunnel syndrome    Celiac disease    Depression    Diabetes mellitus without complication (HCC)    Gestational diabetes    glyburide    Hyperlipidemia    Obesity    Pregnancy induced hypertension    UTI (urinary tract infection)        Palpitations      Prior to Admission medications  Medication Sig Start Date End Date Taking? Authorizing Provider  cholecalciferol (VITAMIN D3) 25 MCG (1000 UNIT) tablet Take 10,000 Units by mouth daily.    [provider]  Drospirenone  (SLYND ) 4 MG TABS Take 1 tablet by mouth daily. 02/11/22   Kizzie Suzen SAUNDERS, CNM  Ferrous Bisglycinate Chelate (EASY IRON PO) Take by mouth.    [provider]  Ibuprofen -Acetaminophen  (ADVIL  DUAL ACTION) 125-250 MG TABS Take by mouth.    [provider]  Multiple Vitamin (MULTIVITAMIN) tablet Take 1 tablet by mouth daily.    [provider]  Omega-3 Fatty Acids (OMEGA-3 FISH OIL PO) Take by mouth.    [provider]  UNABLE TO FIND Essential oils    [provider]    Allergies: Gluten meal    Review of Systems  Cardiovascular:  Positive for palpitations.    Updated Vital Signs BP (!) 169/100   Pulse 92   Temp 99.8 F (37.7 C) (Oral)   Resp 19   LMP 03/22/2024 (Approximate)   SpO2 100%   Physical Exam Vitals and nursing note  reviewed.  HENT:     Head: Normocephalic and atraumatic.     Mouth/Throat:     Mouth: Mucous membranes are moist.  Eyes:     General:        Right eye: No discharge.        Left eye: No discharge.     Conjunctiva/sclera: Conjunctivae normal.  Cardiovascular:     Rate and Rhythm: Normal rate and regular rhythm.     Pulses: Normal pulses.     Heart sounds: Normal heart sounds.  Pulmonary:     Effort: Pulmonary effort is normal.     Breath sounds: Normal breath sounds.  Abdominal:     General: Abdomen is flat.     Palpations: Abdomen is soft.  Skin:    General: Skin is warm and dry.  Neurological:     General: No focal deficit present.  Psychiatric:        Mood and Affect: Mood normal.     (all labs ordered are listed, but only abnormal results are displayed) Labs Reviewed  COMPREHENSIVE METABOLIC PANEL WITH GFR - Abnormal; Notable for the following components:      Result Value   Glucose, Bld 109 (*)    All other components within normal limits  CBC  PRO BRAIN NATRIURETIC PEPTIDE  TSH  MAGNESIUM  D-DIMER, QUANTITATIVE  TROPONIN T,  HIGH SENSITIVITY  TROPONIN T, HIGH SENSITIVITY    EKG: EKG Interpretation Date/Time:  Tuesday March 30 2024 15:56:04 EST Ventricular Rate:  101 PR Interval:  124 QRS Duration:  128 QT Interval:  352 QTC Calculation: 456 R Axis:   86  Text Interpretation: Sinus tachycardia Right bundle branch block Possible Lateral infarct , age undetermined Possible Inferior infarct , age undetermined Abnormal ECG No previous ECGs available Confirmed by Bari Flank ([id number]) on 03/30/2024 8:58:43 PM  Radiology: [facility] Chest Port 1 View Result Date: 03/30/2024 EXAM: 1 VIEW(S) XRAY OF THE CHEST 03/30/2024 06:45:00 PM COMPARISON: 02/09/2015 CLINICAL HISTORY: [facility] SHOB FINDINGS: LUNGS AND PLEURA: No focal pulmonary opacity. No pleural effusion. No pneumothorax. HEART AND MEDIASTINUM: No acute abnormality of the cardiac and mediastinal silhouettes. BONES AND  SOFT TISSUES: No acute osseous abnormality. IMPRESSION: 1. No active cardiopulmonary disease. Electronically signed by: Franky Crease MD 03/30/2024 07:12 PM EST RP Workstation: [id number]     Procedures   Medications Ordered in the ED - No data to display  Clinical Course as of 03/30/24 2220  Tue Mar 30, 2024  2217 [facility] Chest [place] 1 View [JR]    Clinical Course User Index [JR] [name] POUR, PA-C                                 Medical Decision Making Amount and/or Complexity of Data Reviewed Labs: ordered. Radiology:  Decision-making details documented in ED Course.   Initial Impression and Ddx 44 year old well-appearing female presenting for palpitations and chest pain.  Exam is unremarkable.  DDx includes ACS, PE, arrhythmia, dissection, pneumothorax, other. Patient PMH that increases complexity of ED encounter: Hyperlipidemia, diabetes, anxiety  Interpretation of Diagnostics I independent reviewed and interpreted the labs as followed: Unremarkable including negative troponin, BNP and D-dimer  - I independently visualized the following imaging with scope of interpretation limited to determining acute life threatening conditions related to emergency care: Chest x-ray, which revealed no acute process  -I personally reviewed and interpreted EKG which revealed sinus tachycardia and right bundle branch block.  Per chart review, does not appear that she has a prior history of RBBB.   Patient Reassessment and Ultimate Disposition/Management On reassessment patient remained asymptomatic.  Workup was largely unremarkable but EKG was abnormal revealing RBBB.  Overall she looks well with no symptoms at this time and reassuring workup.  Advised her to follow-up with cardiology.  Submitted ambulatory referral.  Also advised her to follow-up with her PCP.  Blood pressure has been somewhat elevated but she has no other associated symptoms at this time concerning for hypertensive emergency.  She  states normally at home her blood pressure is 120/80.  Advised her to continue to trend at home and to follow-up with her PCP as she may need to be started on antihypertensives within the near future.  Discussed return precautions.  Discharge.  Patient management required discussion with the following services or consulting groups:  None  Complexity of Problems Addressed Acute complicated illness or Injury  Additional Data Reviewed and Analyzed Further history obtained from: Past medical history and medications listed in the EMR and Prior ED visit notes  Patient Encounter Risk Assessment Consideration of hospitalization      Final diagnoses:  Abnormal EKG    ED Discharge Orders          Ordered    Ambulatory referral to Cardiology       Comments:  If you have not heard from the Cardiology office within the next 72 hours please call [phone].   03/30/24 2219                [name] POUR, PA-C 03/30/24 2220  "

## 2024-03-30 NOTE — ED Provider Triage Note (Signed)
 Emergency Medicine Provider Triage Evaluation Note  Julia Johnson , a 44 y.o. female  was evaluated in triage.  Pt complains of palpitations since Friday, history of same with menstrual cycle but cycle ended Friday and still feeling palpitations. Also left chest heaviness. Hx asthma, feels SHOB but not like her asthma.   Review of Systems  Positive: SHOB, palpitations, left chest pain Negative:   Physical Exam  There were no vitals taken for this visit. Gen:   Awake, no distress   Resp:  Normal effort, able to speak in complete sentences   MSK:   Moves extremities without difficulty  Other:    Medical Decision Making  Medically screening exam initiated at 3:53 PM.  Appropriate orders placed.  Andriana Casa was informed that the remainder of the evaluation will be completed by another provider, this initial triage assessment does not replace that evaluation, and the importance of remaining in the ED until their evaluation is complete.     Beverley Leita LABOR, PA-C 03/30/24 1556

## 2024-03-30 NOTE — ED Triage Notes (Signed)
 Pt is here for evaluation of palpitations which began Friday.  Pt states that she has had this in the past.  Pt reports that she is having pain in her left side of the chest and some sob.  No nausea with this.  Pt states that it might be anxiety

## 2024-03-30 NOTE — Discharge Instructions (Addendum)
 Evaluation today was mostly reassuring.  EKG was somewhat abnormal and revealed right bundle branch block.  Please follow-up with cardiology.  As we discussed if you develop worsening chest pain, shortness of breath or persistent palpitations or any other concerning symptom please return to the ED for further evaluation.

## 2024-03-31 NOTE — Progress Notes (Signed)
 "  [facility]:    Date:  04/01/2024   ID:  Julia Johnson, DOB [birth year], MRN [id number]  PCP:  [name], NP  Cardiologist:  New - Dr. Court (per patient's request) Click to update primary MD,subspecialty MD or APP then REFRESH:1}    Referring MD: [name] POUR, PA-C   Chief Complaint: abnormal EKG  History of Present Illness:    Julia Johnson is a 44 y.o. female with a history of RBBB, gestational hypertension, hyperlipidemia, gestational diabetes, celiac disease, iron deficiency anemia, carpal tunnel syndrome, anxiety/ depression, and obesity who presents today as a new patient in the [facility] for evaluation of an abnormal EKG.   Patient was seen in the ED on 03/30/2024 for palpitations. She also mentioned some left sided chest pain and intermittent shortness of breath. EKG showed sinus tachycardia, rate 101 bpm, with RBBB, and possible Q waves in inferior leads and leads V3-V6. High-sensitivity troponin negative x2. Pro-BNP negative. D-dimer negative. Electrolytes and TSH were normal. She was felt to be stable for discharged and referred to Cardiology as an outpatient for further evaluation of abnormal EKG.   She is here with her husband.  She reports she has been having left-sided chest pain, dyspnea on exertion, and palpitations since Friday 03/27/2023.  The chest pain has been constant  She describes this as a aching/pressure sensation that has been waxing and waning in intensity since 03/27/2023. She states it feels like a big dog is laying on her chest. It improves if she pushes on that area. She is currently having very mild pain that she ranks as a 1-2/10 on the pain scale.  However, it was more severe yesterday (7-8/10 on the pain scale) and she almost went back to the ED. She also reports dyspnea and palpitations (heart racing) with minimal activity such as walking around the house. She has had these symptoms intermittently in the past (usually around her  period) but the symptoms are now more severe, lasting longer, and feel a little different. She previously had an ETT and monitor in 2021 at [facility] for evaluation of these symptoms. ETT was negative for ischemia. I am unable to see the monitor results but she states it was normal. Symptoms were felt to be due to anxiety at that time. She denies any shortness of breath at rest. No orthopnea, PND, or edema. She does reports some lightheadedness/ dizziness at random times. This sometimes occurs with position changes and sometimes occurs when she turns her head a certain way. No syncope. Her husband also states she has been more fatigued recently.   She recently got a home BP machine. The other night systolic BP was 180 in one arm and BP was 150/104 in the other arm (she thinks the reading of 180 was an error). BP is around the same in both arms today.  BP was 160/100 during recent ED visit. She has a history of gestational diabetes but states she has not had an issues with her BP in almost 18 years since her oldest daughter was born. However, she just got insurance ealier this month and has not seen a medical provider in a couple of years.   She denies any history of tobacco, alcohol, or drug use.   She does have a family history of cardiovascular disease. Her father passed away last year from kidney disease but he had a history of a subarachnoid hemorrhage due to hypertension. Her paternal grandfather has a  history of CHF and atrial fibrillation. Her maternal grandmother has a history of CHF.   EKGs/Labs/Other Studies Reviewed:    The following studies were reviewed:  N/A  EKG:  EKG ordered today.  EKG Interpretation Date/Time:  Thursday April 01 2024 12:14:12 EST Ventricular Rate:  94 PR Interval:  148 QRS Duration:  120 QT Interval:  376 QTC Calculation: 470 R Axis:   65  Text Interpretation: Normal sinus rhythm Right bundle branch block Artifact noted in lead II and III  but no acute ischemic changes No significant change since last tracing Confirmed by Traver Meckes [phone]) on 04/01/2024 1:17:20 PM   Recent Labs: 03/30/2024: ALT 20; BUN 11; Creatinine, Ser 0.70; Hemoglobin 12.3; Magnesium 2.0; Platelets 238; Potassium 4.2; Pro Brain Natriuretic Peptide 89.7; Sodium 136; TSH 1.970   Orthostatic VS for the past 24 hrs (Last 3 readings):  BP- Lying Pulse- Lying BP- Sitting Pulse- Sitting BP- Standing at 0 minutes Pulse- Standing at 0 minutes BP- Standing at 3 minutes Pulse- Standing at 3 minutes  04/01/24 1230 (!) 135/92 86 131/65 94 131/89 102 121/87 101    Recent Lipid Panel    Component Value Date/Time   CHOL 212 (H) 12/25/2015 0920   TRIG 224 (H) 12/25/2015 0920   HDL 43 12/25/2015 0920   CHOLHDL 4.9 (H) 12/25/2015 0920   LDLCALC 124 (H) 12/25/2015 0920    Physical Exam:    Vital Signs: BP (!) 138/90 (BP Location: Left Arm, Patient Position: Sitting)   Pulse 96   Ht 5' 7 (1.702 m)   Wt 264 lb 1.9 oz (119.8 kg)   LMP 03/22/2024 (Approximate)   SpO2 99%   BMI 41.37 kg/m     Wt Readings from Last 3 Encounters:  04/01/24 264 lb 1.9 oz (119.8 kg)  03/15/22 251 lb 12.8 oz (114.2 kg)  02/11/22 255 lb (115.7 kg)     General: 44 y.o. Caucasian female in no acute distress. HEENT: Normocephalic and atraumatic. Sclera clear.  Neck: Supple. No carotid bruits. No JVD. Heart: RRR. Distinct S1 and S2. No murmurs, gallops, or rubs.  Lungs: No increased work of breathing. Clear to ausculation bilaterally. No wheezes, rhonchi, or rales.  Extremities: No lower extremity edema.   Skin: Warm and dry. Neuro: No focal deficits. Psych: Normal affect. Responds appropriately.   Assessment:    1. Chest pain   2. Dyspnea on exertion   3. RBBB   4. Palpitations   5. Dizziness   6. Primary hypertension   7. Hyperlipidemia, unspecified hyperlipidemia type   8. Iron deficiency anemia, unspecified iron deficiency anemia type     Plan:    Chest  Pain Dyspnea on Exertion Abnormal EKG (RBBB) Patient reports constant chest pain and new dyspnea with minimal exertion over the last week.  She was recently seen in the ED for this. EKG showed sinus tachycardia, rate 101 bpm, with RBBB and possible Q waves in inferior leads and leads V3-V6. High-sensitivity, D-dimer, and BNP were negative.  - She continues to have constant chest pain that has been waxing and waning in severity over the last week as well as dyspnea with very minimal activity. She states she has had similar episodes in the past (usually around her heavy periods) but this is worse than usual and lasting longer than usual. Husband also states she has been fatigued lately. - Repeat EKG today shows normal sinus rhythm with known RBBB but no acute ischemic changes. - Chest pain sounds more atypical.  Her dyspnea and fatigue could be an anginal quality.  Will order Echo and Coronary CTA for further evaluation. Will have patient take a one-time dose of Lopressor  100 mg two hours prior to CTA.  Palpitations Dizziness Patient reports palpitations that she describes as heart racing with minimal activity as well as intermittent dizziness as described above.  - We checked orthostatics in the office today.  Please see full results above.  Diastolic BP did drop > 10 points when going from laying to sitting but then normalized. She was asymptomatic during this part (only had dizziness when going from sitting to standing).  I wonder if this was an error given the rest of the readings.  Otherwise, orthostatics were negative. - Will order Echo and 1 week Zio monitor.   Hypertension Patient has a history of gestational hypertension but states she has not had any issues with her BP since her first daughter was born 18 years ago.  However, she has not seen a medical provider in a couple years.  Her BP was elevated during recent ED and has been elevated at home as well.  - BP mildly elevated in the office  today. Initially 133/91 and then 142/ 90 on right arm and 138/ 90 on left arm on my personal at the end of the visit. - Will start Amlodipine  2.5 mg daily for BP and antianginal benefit.  She states she is very sensitive to medications so will start low.  - Asked her to continue to monitor her BP at home.  Hyperlipidemia Lipid panel in 01/2023: Total Cholesterol 221, Triglycerides 402, HDL 35, LDL 136.  - She has previously been intolerant to statins (even very low doses) due to her celiac disease.  - Will repeat lipid panel. She is not fasting today so will return for this. Depending on results of this and coronary CTA results, may need referral to lipid clinic for consideration of PCSK9 inhibitor.   Iron Deficiency Anemia Patient has a history of iron deficiency secondary to menorrhagia. She has not had her iron level checked in a while but hemoglobin was normal at recent ED visit.  - She has not been taking her iron supplement recently.  - Questions whether this could be contributing to her symptoms. Will check an iron panel when she comes back for fasting labs.  - She states this is managed by her GI physician who manages her celiac disease. If iron level is low, will have her restart her iron supplement but will defer additional management to her GI physician.   Disposition: Follow up in 2-3 months after the above studies. She would like to get established with Dr. Court at that time (her grandfather was a patient of Dr. Court).    Signed, Agapito Hanway E Zani Kyllonen, PA-C  04/01/2024 1:27 PM    [facility] "

## 2024-04-01 ENCOUNTER — Ambulatory Visit: Attending: Student | Admitting: Student

## 2024-04-01 ENCOUNTER — Encounter: Payer: Self-pay | Admitting: Student

## 2024-04-01 ENCOUNTER — Ambulatory Visit: Attending: Student

## 2024-04-01 VITALS — BP 138/90 | HR 96 | Ht 67.0 in | Wt 264.1 lb

## 2024-04-01 DIAGNOSIS — I4510 Unspecified right bundle-branch block: Secondary | ICD-10-CM | POA: Diagnosis not present

## 2024-04-01 DIAGNOSIS — R002 Palpitations: Secondary | ICD-10-CM

## 2024-04-01 DIAGNOSIS — D509 Iron deficiency anemia, unspecified: Secondary | ICD-10-CM | POA: Diagnosis not present

## 2024-04-01 DIAGNOSIS — R072 Precordial pain: Secondary | ICD-10-CM

## 2024-04-01 DIAGNOSIS — I10 Essential (primary) hypertension: Secondary | ICD-10-CM

## 2024-04-01 DIAGNOSIS — R0609 Other forms of dyspnea: Secondary | ICD-10-CM | POA: Diagnosis not present

## 2024-04-01 DIAGNOSIS — E785 Hyperlipidemia, unspecified: Secondary | ICD-10-CM

## 2024-04-01 DIAGNOSIS — R42 Dizziness and giddiness: Secondary | ICD-10-CM | POA: Diagnosis not present

## 2024-04-01 MED ORDER — AMLODIPINE BESYLATE 2.5 MG PO TABS: 2.5000 mg | ORAL_TABLET | Freq: Every day | ORAL | 3 refills | Status: AC

## 2024-04-01 MED ORDER — METOPROLOL TARTRATE 100 MG PO TABS: 100.0000 mg | ORAL_TABLET | Freq: Once | ORAL | 0 refills | Status: DC

## 2024-04-01 NOTE — Progress Notes (Unsigned)
 Enrolled patient for a 7 day Zio XT monitor to patients home   Court to read

## 2024-04-01 NOTE — Patient Instructions (Signed)
 Medication Instructions:  Your physician has recommended you make the following change in your medication:  START Amlodpine 2.5 mg taking 1 daily  *If you need a refill on your cardiac medications before your next appointment, please call your pharmacy*  Lab Work: None ordered  If you have labs (blood work) drawn today and your tests are completely normal, you will receive your results only by: MyChart Message (if you have MyChart) OR A paper copy in the mail If you have any lab test that is abnormal or we need to change your treatment, we will call you to review the results.  Testing/Procedures: Your physician has requested that you have an echocardiogram. Echocardiography is a painless test that uses sound waves to create images of your heart. It provides your doctor with information about the size and shape of your heart and how well your hearts chambers and valves are working. This procedure takes approximately one hour. There are no restrictions for this procedure. Please do NOT wear cologne, perfume, aftershave, or lotions (deodorant is allowed). Please arrive 15 minutes prior to your appointment time.  Please note: We ask at that you not bring children with you during ultrasound (echo/ vascular) testing. Due to room size and safety concerns, children are not allowed in the ultrasound rooms during exams. Our front office staff cannot provide observation of children in our lobby area while testing is being conducted. An adult accompanying a patient to their appointment will only be allowed in the ultrasound room at the discretion of the ultrasound technician under special circumstances. We apologize for any inconvenience.    Your physician has requested that you have cardiac CT. Cardiac computed tomography (CT) is a painless test that uses an x-ray machine to take clear, detailed pictures of your heart. For further information please visit [email or web address] Please follow instruction  sheet BELOW:    Your cardiac CT is scheduled at     [facility] [address]  [place], [place] [address]  MONDAY, 04/12/24, ARRIVE AT 10:30.  You can get your FASTING labs done at the same day, the lab is on the 1st floor  If scheduled at [facility], please arrive at the [facility] and Children's Entrance (Entrance C2) of [facility] 30 minutes prior to test start time. You can use the FREE valet parking offered at entrance C (encouraged to control the heart rate for the test)  Proceed to the [facility] Radiology Department (first floor) to check-in and test prep.  All radiology patients and guests should use entrance C2 at [facility], accessed from [facility], even though the hospital's physical address listed is [address].  If scheduled at the Heart and Vascular Tower at [address], please enter the parking lot using the [address] entrance and use the FREE valet service at the patient drop-off area. Enter the building and check-in with registration on the main floor.  If scheduled at [facility], please arrive to the Heart and Vascular Center 15 mins early for check-in and test prep.  There is spacious parking and easy access to the radiology department from the [facility] Heart and Vascular entrance. Please enter here and check-in with the desk attendant.   If scheduled at [facility], please arrive 30 minutes early for check-in and test prep.  Please follow these instructions carefully (unless otherwise directed):  An IV will be required for this test and Nitroglycerin will be  given.    On the Night Before the Test: Be sure to Drink plenty of water. Do not consume any caffeinated/decaffeinated beverages or chocolate 12 hours prior to your test. Do not take any antihistamines 12 hours prior to your test.  On the Day of the Test: Drink plenty of water until 1 hour  prior to the test. Do not eat any food 1 hour prior to test. You may take your regular medications prior to the test.  Take metoprolol  (Lopressor )  100 MG two hours prior to test. THIS HAS BEEN SENT TO YOUR PHARMACY  FEMALES- please wear underwire-free bra if available, avoid dresses & tight clothing       After the Test: Drink plenty of water. After receiving IV contrast, you may experience a mild flushed feeling. This is normal. On occasion, you may experience a mild rash up to 24 hours after the test. This is not dangerous. If this occurs, you can take Benadryl  25 mg, Zyrtec, Claritin, or Allegra and increase your fluid intake. (Patients taking Tikosyn should avoid Benadryl , and may take Zyrtec, Claritin, or Allegra) If you experience trouble breathing, this can be serious. If it is severe call 911 IMMEDIATELY. If it is mild, please call our office.  We will call to schedule your test 2-4 weeks out understanding that some insurance companies will need an authorization prior to the service being performed.   For more information and frequently asked questions, please visit our website : [email or web address]  For non-scheduling related questions, please contact the cardiac imaging nurse navigator should you have any questions/concerns: Cardiac Imaging Nurse Navigators Direct Office Dial: [phone]   For scheduling needs, including cancellations and rescheduling, please call Brittany, [phone].     ZIO XT- Long Term Monitor Instructions  Your physician has requested you wear a ZIO patch monitor for 7 DAYS.  This is a single patch monitor. [employer] supplies one patch monitor per enrollment. Additional stickers are not available. Please do not apply patch if you will be having a Nuclear Stress Test,  Echocardiogram, Cardiac CT, MRI, or Chest Xray during the period you would be wearing the  monitor. The patch cannot be worn during these tests. You cannot remove and  re-apply the  ZIO XT patch monitor.  Your ZIO patch monitor will be mailed 3 day USPS to your address on file. It may take 3-5 days  to receive your monitor after you have been enrolled.  Once you have received your monitor, please review the enclosed instructions. Your monitor  has already been registered assigning a specific monitor serial # to you.  Billing and Patient Assistance Program Information  We have supplied [employer] with any of your insurance information on file for billing purposes. [employer] offers a sliding scale Patient Assistance Program for patients that do not have  insurance, or whose insurance does not completely cover the cost of the ZIO monitor.  You must apply for the Patient Assistance Program to qualify for this discounted rate.  To apply, please call [employer] at [phone], select option 4, select option 2, ask to apply for  Patient Assistance Program. Meredeth will ask your household income, and how many people  are in your household. They will quote your out-of-pocket cost based on that information.  [employer] will also be able to set up a 35-month, interest-free payment plan if needed.  Applying the monitor   Shave hair from upper left chest.  Hold abrader disc by orange tab. Rub abrader in 40 strokes  over the upper left chest as  indicated in your monitor instructions.  Clean area with 4 enclosed alcohol pads. Let dry.  Apply patch as indicated in monitor instructions. Patch will be placed under collarbone on left  side of chest with arrow pointing upward.  Rub patch adhesive wings for 2 minutes. Remove white label marked 1. Remove the white  label marked 2. Rub patch adhesive wings for 2 additional minutes.  While looking in a mirror, press and release button in center of patch. A small green light will  flash 3-4 times. This will be your only indicator that the monitor has been turned on.  Do not shower for the first 24 hours. You may shower after the  first 24 hours.  Press the button if you feel a symptom. You will hear a small click. Record Date, Time and  Symptom in the Patient Logbook.  When you are ready to remove the patch, follow instructions on the last 2 pages of Patient  Logbook. Stick patch monitor onto the last page of Patient Logbook.  Place Patient Logbook in the blue and white box. Use locking tab on box and tape box closed  securely. The blue and white box has prepaid postage on it. Please place it in the mailbox as  soon as possible. Your physician should have your test results approximately 7 days after the  monitor has been mailed back to [facility].  Call [facility] Customer Care at [phone] if you have questions regarding  your ZIO XT patch monitor. Call them immediately if you see an orange light blinking on your  monitor.  If your monitor falls off in less than 4 days, contact our Monitor department at [phone].  If your monitor becomes loose or falls off after 4 days call [employer] at [phone] for  suggestions on securing your monitor   Follow-Up: At [facility], you and your health needs are our priority.  As part of our continuing mission to provide you with exceptional heart care, our providers are all part of one team.  This team includes your primary Cardiologist (physician) and Advanced Practice Providers or APPs (Physician Assistants and Nurse Practitioners) who all work together to provide you with the care you need, when you need it.  Your next appointment:   1-2 WEEKS AFTER ECHOCARDIOGRAM   Provider:   Dorn Lesches, MD    We recommend signing up for the patient portal called MyChart.  Sign up information is provided on this After Visit Summary.  MyChart is used to connect with patients for Virtual Visits (Telemedicine).  Patients are able to view lab/test results, encounter notes, upcoming appointments, etc.  Non-urgent messages can be sent to your provider as well.    To learn more about what you can do with MyChart, go to [email or web address].   Other Instructions

## 2024-04-09 ENCOUNTER — Encounter (HOSPITAL_COMMUNITY): Payer: Self-pay

## 2024-04-12 ENCOUNTER — Ambulatory Visit (HOSPITAL_COMMUNITY)
Admission: RE | Admit: 2024-04-12 | Discharge: 2024-04-12 | Disposition: A | Discharge status: Home / Self Care | Source: Ambulatory Visit | Attending: Internal Medicine | Admitting: Internal Medicine

## 2024-04-12 DIAGNOSIS — R072 Precordial pain: Secondary | ICD-10-CM | POA: Insufficient documentation

## 2024-04-12 MED ORDER — DILTIAZEM HCL 25 MG/5ML IV SOLN: 10.0000 mg | INTRAVENOUS | Status: DC | PRN

## 2024-04-12 MED ORDER — METOPROLOL TARTRATE 5 MG/5ML IV SOLN
10.0000 mg | Freq: Once | INTRAVENOUS | Status: AC | PRN
  Administered 2024-04-12: 5 mg via INTRAVENOUS

## 2024-04-12 MED ORDER — NITROGLYCERIN 0.4 MG SL SUBL
0.8000 mg | SUBLINGUAL_TABLET | Freq: Once | SUBLINGUAL | Status: AC
  Administered 2024-04-12: 0.8 mg via SUBLINGUAL

## 2024-04-12 MED ORDER — IOHEXOL 350 MG/ML SOLN
100.0000 mL | Freq: Once | INTRAVENOUS | Status: AC | PRN
  Administered 2024-04-12: 100 mL via INTRAVENOUS

## 2024-04-13 LAB — IRON,TIBC AND FERRITIN PANEL
Ferritin: 26 ng/mL (ref 15–150)
Iron Saturation: 12 % — ABNORMAL LOW (ref 15–55)
Iron: 57 ug/dL (ref 27–159)
Total Iron Binding Capacity: 468 ug/dL — ABNORMAL HIGH (ref 250–450)
UIBC: 411 ug/dL (ref 131–425)

## 2024-04-13 LAB — LIPID PANEL
Chol/HDL Ratio: 6.2 ratio — ABNORMAL HIGH (ref 0.0–4.4)
Cholesterol, Total: 283 mg/dL — ABNORMAL HIGH (ref 100–199)
HDL: 46 mg/dL
LDL Chol Calc (NIH): 167 mg/dL — ABNORMAL HIGH (ref 0–99)
Triglycerides: 362 mg/dL — ABNORMAL HIGH (ref 0–149)
VLDL Cholesterol Cal: 70 mg/dL — ABNORMAL HIGH (ref 5–40)

## 2024-04-14 ENCOUNTER — Ambulatory Visit: Payer: Self-pay | Admitting: Student

## 2024-04-15 ENCOUNTER — Telehealth: Payer: Self-pay | Admitting: Cardiovascular Disease

## 2024-04-15 NOTE — Telephone Encounter (Signed)
 All issues have been addressed in MyChart message chain. Will close this encounter. Please see 1/21 MyChart

## 2024-04-15 NOTE — Telephone Encounter (Signed)
 Pt c/o BP issue: STAT if pt c/o blurred vision, one-sided weakness or slurred speech.  STAT if BP is GREATER than 180/120 TODAY.  STAT if BP is LESS than 90/60 and SYMPTOMATIC TODAY  1. What is your BP concern?   Patient stated her BP is still trending high and wants advice on next steps  2. Have you taken any BP medication today?  No - Patient stated she takes her medication at night around 9:00 pm  3. What are your last 5 BP readings?  170/100 - today 170/93 161/92 159/103 167/100  BP last night was 153/98 after taking medication around 9:50 pm  4. Are you having any other symptoms (ex. Dizziness, headache, blurred vision, passed out)?    No  Patient stated along the line of her shoulder blade she has been having some shoulder pain.  Patient is concerned her medication is not working.

## 2024-04-17 MED ORDER — EZETIMIBE 10 MG PO TABS: 10.0000 mg | ORAL_TABLET | Freq: Every day | ORAL | 2 refills | Status: AC

## 2024-04-23 DIAGNOSIS — R072 Precordial pain: Secondary | ICD-10-CM

## 2024-04-23 DIAGNOSIS — R002 Palpitations: Secondary | ICD-10-CM | POA: Diagnosis not present

## 2024-04-27 ENCOUNTER — Ambulatory Visit (HOSPITAL_COMMUNITY): Admission: RE | Admit: 2024-04-27 | Discharge: 2024-04-27 | Attending: Internal Medicine

## 2024-04-27 DIAGNOSIS — R072 Precordial pain: Secondary | ICD-10-CM | POA: Diagnosis not present

## 2024-04-27 LAB — ECHOCARDIOGRAM COMPLETE
AR max vel: 2.14 cm2
AV Area VTI: 2.17 cm2
AV Area mean vel: 2.11 cm2
AV Mean grad: 6 mmHg
AV Peak grad: 10 mmHg
Ao pk vel: 1.58 m/s
Area-P 1/2: 5.07 cm2
S' Lateral: 2.1 cm

## 2024-04-28 ENCOUNTER — Ambulatory Visit (HOSPITAL_COMMUNITY)

## 2024-04-29 ENCOUNTER — Ambulatory Visit: Admitting: Physician Assistant

## 2024-04-29 MED ORDER — CARVEDILOL 3.125 MG PO TABS: 3.1250 mg | ORAL_TABLET | Freq: Two times a day (BID) | ORAL | 3 refills | Status: AC

## 2024-04-30 ENCOUNTER — Ambulatory Visit: Admitting: Physician Assistant

## 2024-04-30 ENCOUNTER — Encounter: Payer: Self-pay | Admitting: Physician Assistant

## 2024-04-30 VITALS — BP 145/88 | HR 91 | Temp 98.0°F | Resp 18 | Ht 67.0 in | Wt 264.4 lb

## 2024-04-30 DIAGNOSIS — I10 Essential (primary) hypertension: Secondary | ICD-10-CM | POA: Insufficient documentation

## 2024-04-30 DIAGNOSIS — Z1231 Encounter for screening mammogram for malignant neoplasm of breast: Secondary | ICD-10-CM

## 2024-04-30 DIAGNOSIS — F411 Generalized anxiety disorder: Secondary | ICD-10-CM | POA: Insufficient documentation

## 2024-04-30 DIAGNOSIS — K900 Celiac disease: Secondary | ICD-10-CM

## 2024-04-30 DIAGNOSIS — N920 Excessive and frequent menstruation with regular cycle: Secondary | ICD-10-CM

## 2024-04-30 DIAGNOSIS — R7303 Prediabetes: Secondary | ICD-10-CM | POA: Insufficient documentation

## 2024-04-30 LAB — HEMOGLOBIN A1C: Hgb A1c MFr Bld: 5.8 % (ref 4.6–6.5)

## 2024-04-30 NOTE — Progress Notes (Signed)
 "  New Patient Office Visit  Subjective    Patient ID: Julia Johnson, female    DOB: 07-04-1980  Age: 44 y.o. MRN: [id number]  CC:  Chief Complaint  Patient presents with   New Patient (Initial Visit)    HPI Julia Johnson presents to establish care  Discussed the use of AI scribe software for clinical note transcription with the patient, who gave verbal consent to proceed.  History of Present Illness Earlier this year she had chest pain leading to an ER visit where an abnormal EKG was found. She has since had comprehensive work-up performed with [facility] and Dr. Court.  Her heart rate is often elevated and she feels the tachycardia is sometimes associated with chest pain. Minimal activity can cause fatigue with shortness of breath which contributes to heightened anxiety. She notes increased anxiety around her menstrual cycle but has no previous anxiety diagnosis.  She has celiac disease reportedly confirmed by biopsy in 2017 and follows a strict gluten-free diet. She has hyperlipidemia and is intolerant to statins and red yeast rice. She manages cholesterol with Zetia  but finds diet and exercise challenging.  She has rectal bleeding during menstrual periods and was told she has internal hemorrhoids. A prior GI specialist felt symptoms may worsen with hormonal changes. She is reestablishing care with OB/GYN and GI to address these issues. She is the primary caregiver for her husband and children, which she reports limited her ability to move forward with hysterectomy in the past.     Outpatient Encounter Medications as of 04/30/2024  Medication Sig   amLODipine  (NORVASC ) 2.5 MG tablet Take 1 tablet (2.5 mg total) by mouth daily. (Patient taking differently: Take 5 mg by mouth daily.)   carvedilol  (COREG ) 3.125 MG tablet Take 1 tablet (3.125 mg total) by mouth 2 (two) times daily.   Cholecalciferol (D 5000) 125 MCG (5000 UT) capsule Take 5,000 Units by mouth daily.   ezetimibe   (ZETIA ) 10 MG tablet Take 1 tablet (10 mg total) by mouth daily.   Ferrous Bisglycinate Chelate (EASY IRON PO) Take by mouth.   ibuprofen  (ADVIL ) 800 MG tablet Take 800 mg by mouth every 8 (eight) hours as needed for mild pain (pain score 1-3).   Ibuprofen -Acetaminophen  (ADVIL  DUAL ACTION) 125-250 MG TABS Take by mouth.   Omega-3 Fatty Acids (OMEGA-3 FISH OIL PO) Take by mouth.   UNABLE TO FIND Essential oils   [DISCONTINUED] metoprolol  tartrate (LOPRESSOR ) 100 MG tablet Take 1 tablet (100 mg total) by mouth once. Take 90-120 minutes prior to scan. Hold for SBP less than 110.   No facility-administered encounter medications on file as of 04/30/2024.    Past Medical History:  Diagnosis Date   ADHD (attention deficit hyperactivity disorder)    Anxiety    Carpal tunnel syndrome    Celiac disease    Depression    Diabetes mellitus without complication (HCC)    Gestational diabetes    glyburide    Hyperlipidemia    Obesity    Pregnancy induced hypertension    UTI (urinary tract infection)     Past Surgical History:  Procedure Laterality Date   MOUTH SURGERY     SKIN BIOPSY  2012    Family History  Problem Relation Age of Onset   Cancer Mother        breast   Diabetes Mother    Factor V Leiden deficiency Mother    Heart failure Mother    Heart disease Mother    Hypertension  Father    Diabetes Father    Aneurysm Father    Kidney disease Father        on dialysis   Subarachnoid hemorrhage Father    Diabetes Maternal Grandmother    Congestive Heart Failure Maternal Grandmother    Diabetes Paternal Grandfather    Congestive Heart Failure Paternal Grandfather    Celiac disease Daughter    Other Daughter        Celiac gene    Social History   Socioeconomic History   Marital status: Married    Spouse name: Not on file   Number of children: Not on file   Years of education: Not on file   Highest education level: Not on file  Occupational History   Not on file  Tobacco  Use   Smoking status: Never   Smokeless tobacco: Never  Vaping Use   Vaping status: Never Used  Substance and Sexual Activity   Alcohol use: No   Drug use: No   Sexual activity: Not Currently  Other Topics Concern   Not on file  Social History Narrative   Not on file   Social Drivers of Health   Tobacco Use: Low Risk (04/30/2024)   Patient History    Smoking Tobacco Use: Never    Smokeless Tobacco Use: Never    Passive Exposure: Not on file  Financial Resource Strain: Medium Risk (02/11/2022)   Overall Financial Resource Strain (CARDIA)    Difficulty of Paying Living Expenses: Somewhat hard  Food Insecurity: No Food Insecurity (02/11/2022)   Hunger Vital Sign    Worried About Running Out of Food in the Last Year: Never true    Ran Out of Food in the Last Year: Never true  Transportation Needs: No Transportation Needs (02/11/2022)   PRAPARE - Administrator, Civil Service (Medical): No    Lack of Transportation (Non-Medical): No  Physical Activity: Insufficiently Active (02/11/2022)   Exercise Vital Sign    Days of Exercise per Week: 1 day    Minutes of Exercise per Session: 20 min  Stress: Stress Concern Present (02/11/2022)   [employer] of Occupational Health - Occupational Stress Questionnaire    Feeling of Stress : To some extent  Social Connections: Moderately Integrated (02/11/2022)   Social Connection and Isolation Panel    Frequency of Communication with Friends and Family: More than three times a week    Frequency of Social Gatherings with Friends and Family: Twice a week    Attends Religious Services: More than 4 times per year    Active Member of Clubs or Organizations: No    Attends Banker Meetings: Never    Marital Status: Married  Catering Manager Violence: Not At Risk (02/11/2022)   Humiliation, Afraid, Rape, and Kick questionnaire    Fear of Current or Ex-Partner: No    Emotionally Abused: No    Physically Abused: No     Sexually Abused: No  Depression (PHQ2-9): Medium Risk (04/30/2024)   Depression (PHQ2-9)    PHQ-2 Score: 8  Alcohol Screen: Low Risk (02/11/2022)   Alcohol Screen    Last Alcohol Screening Score (AUDIT): 0  Housing: Low Risk (02/11/2022)   Housing    Last Housing Risk Score: 0  Utilities: Not At Risk (02/11/2022)   [facility] Utilities    Threatened with loss of utilities: No  Health Literacy: Not on file    ROS See HPI, all else negative.      Objective  BP (!) 145/88   Pulse 91   Temp 98 F (36.7 C)   Resp 18   Ht 5' 7 (1.702 m)   Wt 264 lb 6.4 oz (119.9 kg)   SpO2 99%   BMI 41.41 kg/m   Physical Exam Constitutional:      Appearance: Normal appearance.  Eyes:     Extraocular Movements: Extraocular movements intact.  Pulmonary:     Effort: Pulmonary effort is normal.  Musculoskeletal:        General: Normal range of motion.     Cervical back: Normal range of motion.  Neurological:     Mental Status: She is alert and oriented to person, place, and time.  Psychiatric:        Behavior: Behavior normal.         Assessment & Plan:   Problem List Items Addressed This Visit       Digestive   Celiac disease   - Serially negative tTG-IgA testing.  - tTg-IgG was also negative 05/25/2015. - Reports positive biopsy in the past. - Reports that she is establishing with [facility] gastroenterology. - Continue gluten-free diet.        Other   Menorrhagia with regular cycle   - Reports breakthrough bleeding recently after starting antihypertensives, although less likely related. - Remains interested in hysterectomy. - Planning to establish and discuss with [facility].      Prediabetes   - Previous A1c of 5.9. Discussed importance of lifestyle modifications to prevent progression to diabetes. - Will recheck A1c.       Relevant Orders   Hemoglobin A1c (Completed)   Generalized anxiety disorder - Primary   - Denies any significant anxiety and her GAD-7 and  PHQ-9 were largely reassuring. - That said, she does seem to have a component of illness anxiety disorder and admittedly has tendency to perseverate on certain results. - She feels wholly responsible for the wellbeing of her husband, as well. Previously restricted her from having the hysterectomy that she wanted. - Recommend that she continue to monitor for worsening anxiety and would be happy to provide her with resources, strategies, or medical management if needed.      Morbid obesity (HCC)   - BMI of 41.41, classified as morbid obesity. Discussed associated health risks including insulin resistance, hyperlipidemia, and obstructive sleep apnea.  - Encouraged regular moderate-intensity exercise 4-5 times a week. - Discussed dietary modifications to support weight loss.      Relevant Orders   Hemoglobin A1c (Completed)   Other Visit Diagnoses       Screening mammogram for breast cancer       Relevant Orders   MM 3D SCREENING MAMMOGRAM BILATERAL BREAST         No follow-ups on file.   Honora Seip, PA-C   "

## 2024-04-30 NOTE — Patient Instructions (Signed)
" °  VISIT SUMMARY: During your visit, we discussed several health concerns including heavy menstrual bleeding, hypertension, hyperlipidemia, celiac disease, prediabetes, morbid obesity, and generalized anxiety disorder. We also reviewed your general health maintenance needs.  YOUR PLAN: -MENORRHAGIA: Menorrhagia is heavy menstrual bleeding. We discussed the recent irregularities in your menstrual cycle and the possibility of being perimenopausal. You will follow up with [facility] for further evaluation and management. We also discussed the potential for home health care during postoperative recovery if a hysterectomy is considered.  -HYPERTENSION WITH LEFT VENTRICULAR HYPERTROPHY: Hypertension is high blood pressure, and left ventricular hypertrophy is the thickening of the heart's left ventricle. You will continue your antihypertensive medication regimen and monitor your blood pressure regularly at home. Follow up with your cardiologist for further evaluation and management.  -HYPERLIPIDEMIA: Hyperlipidemia is having high levels of lipids (fats) in your blood. You will continue taking Zetia  for lipid management.   -CELIAC DISEASE: Celiac disease is an immune reaction to eating gluten. You will establish care with a new GI specialist for ongoing management and continue following a strict gluten-free diet.  -PREDIABETES: Prediabetes is when your blood sugar levels are higher than normal but not high enough to be diagnosed as diabetes. We ordered an A1c test to monitor your glucose levels and encouraged you to make dietary modifications and engage in regular exercise.  -MORBID OBESITY: Morbid obesity is having a BMI of 40 or higher. We discussed the associated health risks and emphasized the importance of lifestyle changes. You are encouraged to engage in regular moderate-intensity exercise 4-5 times a week and make dietary modifications to support weight loss.  -GENERALIZED ANXIETY DISORDER:  Generalized anxiety disorder involves excessive anxiety and worry. We discussed your anxiety symptoms, particularly around your menstrual cycle, and the potential impact on your physical symptoms. We will continue to monitor your anxiety symptoms and consider further evaluation if they persist or worsen.  -GENERAL HEALTH MAINTENANCE: You are overdue for a mammogram, especially given your family history of breast cancer. We discussed the importance of regular screenings and health maintenance. A screening mammogram has been ordered and we will schedule your annual physical for a comprehensive health maintenance review.  INSTRUCTIONS: Please follow up with [facility] for further evaluation and management of your menorrhagia. Continue monitoring your blood pressure at home and follow up with your cardiologist. Establish care with a new GI specialist for your celiac disease management. We have ordered an A1c test to monitor your g lucose levels. Please schedule a screening mammogram and your annual physical for a comprehensive health maintenance review.    Contains text generated by [employer].   "

## 2024-04-30 NOTE — Assessment & Plan Note (Addendum)
-   Reports breakthrough bleeding recently after starting antihypertensives, although less likely related. - Remains interested in hysterectomy. - Planning to establish and discuss with [facility].

## 2024-04-30 NOTE — Assessment & Plan Note (Signed)
-   Follows with [facility] - Amlodipine  2.5 mg and carvedilol  3.125 mg. - Echocardiogram 04/27/2024 revealed slight ventricular hypertrophy, likely in setting of HTN. Otherwise reassuring. - Long term monitor revealed average HR of 96 and rare ectopy, but no arrhythmia.  - CT coronaries 04/12/2024 revealed minimal nonobstructive CAD and recommended alternative w/u for patient's nonspecific chest discomfort.

## 2024-04-30 NOTE — Assessment & Plan Note (Addendum)
-   Previous A1c of 5.9. Discussed importance of lifestyle modifications to prevent progression to diabetes. - Will recheck A1c.

## 2024-04-30 NOTE — Assessment & Plan Note (Addendum)
-   Serially negative tTG-IgA testing.  - tTg-IgG was also negative 05/25/2015. - Reports positive biopsy in the past. - Reports that she is establishing with [facility] gastroenterology. - Continue gluten-free diet.

## 2024-04-30 NOTE — Assessment & Plan Note (Addendum)
-   Labs obtained 04/12/2024 revealed total cholesterol of 283 and triglycerides of 362. - Poorly tolerated statins and red yeast rice. - Recently started on Zetia  by [facility].

## 2024-04-30 NOTE — Assessment & Plan Note (Addendum)
-   Denies any significant anxiety and her GAD-7 and PHQ-9 were largely reassuring. - That said, she does seem to have a component of illness anxiety disorder and admittedly has tendency to perseverate on certain results. - She feels wholly responsible for the wellbeing of her husband, as well. Previously restricted her from having the hysterectomy that she wanted. - Recommend that she continue to monitor for worsening anxiety and would be happy to provide her with resources, strategies, or medical management if needed.

## 2024-04-30 NOTE — Assessment & Plan Note (Signed)
-   BMI of 41.41, classified as morbid obesity. Discussed associated health risks including insulin resistance, hyperlipidemia, and obstructive sleep apnea.  - Encouraged regular moderate-intensity exercise 4-5 times a week. - Discussed dietary modifications to support weight loss.

## 2024-05-03 ENCOUNTER — Ambulatory Visit: Admitting: Cardiovascular Disease
